# Patient Record
Sex: Female | Born: 1970
Health system: Southern US, Community
[De-identification: ages and names within clinical notes are randomized; demographics above are authoritative.]

## PROBLEM LIST (undated history)

## (undated) DIAGNOSIS — F419 Anxiety disorder, unspecified: Secondary | ICD-10-CM

## (undated) DIAGNOSIS — F329 Major depressive disorder, single episode, unspecified: Secondary | ICD-10-CM

## (undated) DIAGNOSIS — T7840XA Allergy, unspecified, initial encounter: Secondary | ICD-10-CM

## (undated) DIAGNOSIS — E785 Hyperlipidemia, unspecified: Secondary | ICD-10-CM

## (undated) DIAGNOSIS — Z87898 Personal history of other specified conditions: Secondary | ICD-10-CM

## (undated) DIAGNOSIS — E119 Type 2 diabetes mellitus without complications: Secondary | ICD-10-CM

## (undated) DIAGNOSIS — F988 Other specified behavioral and emotional disorders with onset usually occurring in childhood and adolescence: Secondary | ICD-10-CM

## (undated) DIAGNOSIS — G4730 Sleep apnea, unspecified: Secondary | ICD-10-CM

## (undated) DIAGNOSIS — I10 Essential (primary) hypertension: Secondary | ICD-10-CM

## (undated) DIAGNOSIS — J45909 Unspecified asthma, uncomplicated: Secondary | ICD-10-CM

## (undated) DIAGNOSIS — F32A Depression, unspecified: Secondary | ICD-10-CM

## (undated) HISTORY — DX: Anxiety disorder, unspecified: F41.9

## (undated) HISTORY — DX: Other specified behavioral and emotional disorders with onset usually occurring in childhood and adolescence: F98.8

## (undated) HISTORY — DX: Depression, unspecified: F32.A

## (undated) HISTORY — DX: Major depressive disorder, single episode, unspecified: F32.9

## (undated) HISTORY — DX: Unspecified asthma, uncomplicated: J45.909

## (undated) HISTORY — DX: Sleep apnea, unspecified: G47.30

## (undated) HISTORY — DX: Essential (primary) hypertension: I10

## (undated) HISTORY — DX: Personal history of other specified conditions: Z87.898

## (undated) HISTORY — PX: OTHER SURGICAL HISTORY: SHX169

## (undated) HISTORY — DX: Type 2 diabetes mellitus without complications: E11.9

## (undated) HISTORY — DX: Allergy, unspecified, initial encounter: T78.40XA

## (undated) HISTORY — DX: Hyperlipidemia, unspecified: E78.5

---

## 2002-02-11 ENCOUNTER — Other Ambulatory Visit (HOSPITAL_COMMUNITY): Admission: RE | Admit: 2002-02-11 | Discharge: 2002-02-20 | Payer: Self-pay | Admitting: Psychiatry

## 2002-02-27 ENCOUNTER — Encounter: Admission: RE | Admit: 2002-02-27 | Discharge: 2002-02-27 | Payer: Self-pay | Admitting: Psychiatry

## 2002-03-04 ENCOUNTER — Encounter: Admission: RE | Admit: 2002-03-04 | Discharge: 2002-03-04 | Payer: Self-pay | Admitting: Psychiatry

## 2002-03-18 ENCOUNTER — Encounter: Admission: RE | Admit: 2002-03-18 | Discharge: 2002-03-18 | Payer: Self-pay | Admitting: Psychiatry

## 2002-03-25 ENCOUNTER — Encounter: Admission: RE | Admit: 2002-03-25 | Discharge: 2002-03-25 | Payer: Self-pay | Admitting: Psychiatry

## 2002-05-24 ENCOUNTER — Encounter: Admission: RE | Admit: 2002-05-24 | Discharge: 2002-05-24 | Payer: Self-pay | Admitting: Psychiatry

## 2002-05-29 ENCOUNTER — Encounter: Admission: RE | Admit: 2002-05-29 | Discharge: 2002-05-29 | Payer: Self-pay | Admitting: *Deleted

## 2002-06-07 ENCOUNTER — Encounter: Admission: RE | Admit: 2002-06-07 | Discharge: 2002-06-07 | Payer: Self-pay | Admitting: Psychiatry

## 2002-09-24 ENCOUNTER — Encounter: Admission: RE | Admit: 2002-09-24 | Discharge: 2002-09-24 | Payer: Self-pay | Admitting: Psychiatry

## 2005-07-29 ENCOUNTER — Ambulatory Visit: Payer: Self-pay | Admitting: Family Medicine

## 2008-11-19 ENCOUNTER — Ambulatory Visit: Payer: Self-pay | Admitting: Family Medicine

## 2009-11-05 ENCOUNTER — Ambulatory Visit: Payer: Self-pay | Admitting: Family Medicine

## 2010-11-16 ENCOUNTER — Ambulatory Visit: Payer: Self-pay | Admitting: Family Medicine

## 2012-01-19 ENCOUNTER — Ambulatory Visit: Payer: Self-pay | Admitting: Family Medicine

## 2012-08-15 ENCOUNTER — Telehealth: Payer: Self-pay | Admitting: Family Medicine

## 2012-08-15 NOTE — Telephone Encounter (Signed)
Pt was in w/her sister, Samantha Moses who is your pt and says you wanted [place] to check for a sooner apptmt than 10/15/2012 for a new pt. You do not have any that are sooner.  Do you want [place] to schedule this pt in a 30 min slot prior to 10/15/2012? Thank you.

## 2012-08-15 NOTE — Telephone Encounter (Signed)
R/s to 08/22/2012

## 2012-08-15 NOTE — Telephone Encounter (Signed)
Yes please

## 2012-08-22 ENCOUNTER — Ambulatory Visit (INDEPENDENT_AMBULATORY_CARE_PROVIDER_SITE_OTHER): Payer: BC Managed Care – PPO | Admitting: Family Medicine

## 2012-08-22 ENCOUNTER — Encounter: Payer: Self-pay | Admitting: Family Medicine

## 2012-08-22 VITALS — BP 118/68 | HR 76 | Temp 98.1°F | Ht 63.25 in | Wt 230.5 lb

## 2012-08-22 DIAGNOSIS — E1169 Type 2 diabetes mellitus with other specified complication: Secondary | ICD-10-CM | POA: Insufficient documentation

## 2012-08-22 DIAGNOSIS — E785 Hyperlipidemia, unspecified: Secondary | ICD-10-CM

## 2012-08-22 DIAGNOSIS — F4323 Adjustment disorder with mixed anxiety and depressed mood: Secondary | ICD-10-CM | POA: Insufficient documentation

## 2012-08-22 DIAGNOSIS — F32A Depression, unspecified: Secondary | ICD-10-CM

## 2012-08-22 DIAGNOSIS — J309 Allergic rhinitis, unspecified: Secondary | ICD-10-CM

## 2012-08-22 DIAGNOSIS — E119 Type 2 diabetes mellitus without complications: Secondary | ICD-10-CM

## 2012-08-22 DIAGNOSIS — E118 Type 2 diabetes mellitus with unspecified complications: Secondary | ICD-10-CM | POA: Insufficient documentation

## 2012-08-22 DIAGNOSIS — I10 Essential (primary) hypertension: Secondary | ICD-10-CM | POA: Insufficient documentation

## 2012-08-22 DIAGNOSIS — F329 Major depressive disorder, single episode, unspecified: Secondary | ICD-10-CM

## 2012-08-22 DIAGNOSIS — N920 Excessive and frequent menstruation with regular cycle: Secondary | ICD-10-CM

## 2012-08-22 DIAGNOSIS — F988 Other specified behavioral and emotional disorders with onset usually occurring in childhood and adolescence: Secondary | ICD-10-CM | POA: Insufficient documentation

## 2012-08-22 LAB — COMPREHENSIVE METABOLIC PANEL
ALT: 34 U/L (ref 0–35)
AST: 19 U/L (ref 0–37)
Albumin: 4 g/dL (ref 3.5–5.2)
CO2: 25 mEq/L (ref 19–32)
Calcium: 9.2 mg/dL (ref 8.4–10.5)
Chloride: 101 mEq/L (ref 96–112)
Potassium: 3.9 mEq/L (ref 3.5–5.1)
Total Bilirubin: 0.5 mg/dL (ref 0.3–1.2)
Total Protein: 6.9 g/dL (ref 6.0–8.3)

## 2012-08-22 LAB — LIPID PANEL
Cholesterol: 203 mg/dL — ABNORMAL HIGH (ref 0–200)
Total CHOL/HDL Ratio: 5
Triglycerides: 240 mg/dL — ABNORMAL HIGH (ref 0.0–149.0)
VLDL: 48 mg/dL — ABNORMAL HIGH (ref 0.0–40.0)

## 2012-08-22 MED ORDER — NORETHINDRONE ACET-ETHINYL EST 1.5-30 MG-MCG PO TABS: 1.0000 | ORAL_TABLET | Freq: Every day | ORAL | Status: DC

## 2012-08-22 MED ORDER — FLUTICASONE PROPIONATE 50 MCG/ACT NA SUSP: 2.0000 | Freq: Every day | NASAL | Status: DC

## 2012-08-22 MED ORDER — LISINOPRIL-HYDROCHLOROTHIAZIDE 10-12.5 MG PO TABS: 1.0000 | ORAL_TABLET | Freq: Every day | ORAL | Status: DC

## 2012-08-22 NOTE — Progress Notes (Signed)
Subjective:    Patient ID: Samantha Moses, female    DOB: 03-30-1971, 42 y.o.   MRN: [id number]  HPI  Very pleasant 42 yo here to establish care.  DM- diagnosed around the year 2000.  On Glipizide 5 mg daily and Metformin 1000 mg twice daily. Checks her CBGs daily- fasting running between 109-120s.  Highest recently was 150 but this was rare. Denies any episodes of hypoglycemia.  HLD- on Pravastatin 40 mg daily and has been for at least 10 years.  Denies any myalgias.  HTN-  On Lisinopril 20/HCTZ 25 mg daily.  She has had some episodes of hypotension when standing.  [place] and hit her head 5 years ago and she thinks it was due to hypotension.  Depression- sees Dr. Maryruth Bun.  Has been stable on Wellbutrin.  ADD- also followed by Dr. Maryruth Bun.  On Adderall.  Allergic rhinitis- takes fexofenadine daily.  Has cats and she is allergic to them. Symptoms have been worse recently.  Menorrhagia- has always had heavy periods.  She does have boyfriend, uses condoms. G0- UTD pap smear with no family h/o blood clots or cancer other than an aunt with breast CA (paternal).   Patient Active Problem List   Diagnosis Date Noted  . Allergic rhinitis 08/22/2012  . Hyperlipidemia   . Diabetes mellitus without complication   . Hypertension   . Depression   . ADD (attention deficit disorder)    Past Medical History  Diagnosis Date  . Hyperlipidemia   . Diabetes mellitus without complication   . Hypertension   . Depression   . ADD (attention deficit disorder)   . History of seizures    No past surgical history on file. History  Substance Use Topics  . Smoking status: Never Smoker   . Smokeless tobacco: Never Used  . Alcohol Use: Yes     Comment: rarely   Family History  Problem Relation Age of Onset  . Hyperlipidemia Mother   . Hypertension Mother   . Hyperlipidemia Father   . Hypertension Father   . Diabetes Father   . Cancer Maternal Aunt 40    breast CA   Allergies  Allergen  Reactions  . Sulfa Antibiotics Anaphylaxis  . Cefzil (Cefprozil) Rash   No current outpatient prescriptions on file prior to visit.   No current facility-administered medications on file prior to visit.   The PMH, PSH, Social History, Family History, Medications, and allergies have been reviewed in [facility], and have been updated if relevant.   Review of Systems See HPI No fevers No changes in appetite No CP No SOB No wheezing    Objective:   Physical Exam BP 118/68  Pulse 76  Temp(Src) 98.1 F (36.7 C) (Oral)  Ht 5' 3.25" (1.607 m)  Wt 230 lb 8 oz (104.554 kg)  BMI 40.49 kg/m2  LMP 08/01/2012  General:  Obese, Well-developed,well-nourished,in no acute distress; alert,appropriate and cooperative throughout examination Head:  normocephalic and atraumatic.   Eyes:  vision grossly intact, pupils equal, pupils round, and pupils reactive to light.   Ears:  R ear normal and L ear normal.   Nose:  no external deformity.   +boggy turbinates Mouth:  good dentition.   Neck:  No deformities, masses, or tenderness noted. Lungs:  Normal respiratory effort, chest expands symmetrically. Lungs are clear to auscultation, no crackles or wheezes. Heart:  Normal rate and regular rhythm. S1 and S2 normal without gallop, murmur, click, rub or other extra sounds. Abdomen:  Bowel  sounds positive,abdomen soft and non-tender without masses, organomegaly or hernias noted. Msk:  No deformity or scoliosis noted of thoracic or lumbar spine.   Extremities:  No clubbing, cyanosis, edema, or deformity noted with normal full range of motion of all joints.   Neurologic:  alert & oriented X3 and gait normal.   Skin:  Intact without suspicious lesions or rashes Cervical Nodes:  No lymphadenopathy noted Axillary Nodes:  No palpable lymphadenopathy Psych:  Cognition and judgment appear intact. Alert and cooperative with normal attention span and concentration. No apparent delusions, illusions, hallucinations      Assessment & Plan:  1. ADD (attention deficit disorder) Followed by Dr. Maryruth Bun. On adderral.  2. Depression Stable on Wellbutrin.  3. Diabetes mellitus without complication Continue current meds.  Check a1c today. - Hemoglobin A1c  4. Hyperlipidemia Continue pravastatin.  Check lipids today. - Lipid Panel  5. Hypertension With hypotension.  Will decrease dose of lisinopril/HCTZ.  She will have pharmacist at work check BP and call me in two weeks. - Comprehensive metabolic panel  6. Allergic rhinitis Will add flonase.  7. Menorrhagia   Discussed tx options. Non smoker.  Discussed risks and benefits.  Will start Loestrin.

## 2012-08-22 NOTE — Patient Instructions (Addendum)
Good to see you. We will call you with your lab results.  We are decreasing your blood pressure medication. Please call me in 2 weeks with your BP readings.   We are also adding flonase.

## 2012-08-23 ENCOUNTER — Encounter: Payer: Self-pay | Admitting: *Deleted

## 2012-08-24 ENCOUNTER — Encounter: Payer: Self-pay | Admitting: Family Medicine

## 2012-09-11 ENCOUNTER — Encounter: Payer: Self-pay | Admitting: Family Medicine

## 2012-09-11 ENCOUNTER — Other Ambulatory Visit: Payer: Self-pay | Admitting: Family Medicine

## 2012-09-11 MED ORDER — LISINOPRIL 10 MG PO TABS: 10.0000 mg | ORAL_TABLET | Freq: Every day | ORAL | Status: DC

## 2012-10-10 ENCOUNTER — Encounter: Payer: Self-pay | Admitting: Family Medicine

## 2012-10-10 MED ORDER — PRAVASTATIN SODIUM 40 MG PO TABS: 40.0000 mg | ORAL_TABLET | Freq: Every day | ORAL | Status: DC

## 2012-10-10 MED ORDER — GLIPIZIDE 5 MG PO TABS: 5.0000 mg | ORAL_TABLET | Freq: Every day | ORAL | Status: DC

## 2012-10-10 MED ORDER — METFORMIN HCL 1000 MG PO TABS: 1000.0000 mg | ORAL_TABLET | Freq: Two times a day (BID) | ORAL | Status: DC

## 2012-10-10 NOTE — Telephone Encounter (Signed)
Medicines have been sent to pharmacy.

## 2012-10-15 ENCOUNTER — Ambulatory Visit: Payer: Self-pay | Admitting: Family Medicine

## 2012-10-30 ENCOUNTER — Encounter: Payer: Self-pay | Admitting: Family Medicine

## 2012-11-01 ENCOUNTER — Ambulatory Visit (INDEPENDENT_AMBULATORY_CARE_PROVIDER_SITE_OTHER): Payer: BC Managed Care – PPO | Admitting: Family Medicine

## 2012-11-01 ENCOUNTER — Encounter: Payer: Self-pay | Admitting: Family Medicine

## 2012-11-01 VITALS — BP 102/64 | HR 80 | Temp 98.0°F | Wt 232.0 lb

## 2012-11-01 DIAGNOSIS — Z23 Encounter for immunization: Secondary | ICD-10-CM

## 2012-11-01 DIAGNOSIS — S81809A Unspecified open wound, unspecified lower leg, initial encounter: Secondary | ICD-10-CM

## 2012-11-01 DIAGNOSIS — S81812A Laceration without foreign body, left lower leg, initial encounter: Secondary | ICD-10-CM

## 2012-11-01 DIAGNOSIS — S81819A Laceration without foreign body, unspecified lower leg, initial encounter: Secondary | ICD-10-CM | POA: Insufficient documentation

## 2012-11-01 MED ORDER — DOXYCYCLINE HYCLATE 100 MG PO TABS: 100.0000 mg | ORAL_TABLET | Freq: Two times a day (BID) | ORAL | Status: DC

## 2012-11-01 NOTE — Progress Notes (Signed)
Subjective:    Patient ID: Samantha Moses, female    DOB: 05/19/1970, 41 y.o.   MRN: [id number]  HPI Very pleasant 42 yo here for right leg laceration.  [place] the beach and her leg got caught in a wooden/metal door on 7/11.  Since then, laceration has become more swollen, inflamed.  Bottom is draining thick pus on and off.  No fevers.  No n/v/d.  Due for Tdap.  Patient Active Problem List   Diagnosis Date Noted  . Leg laceration 11/01/2012  . Allergic rhinitis 08/22/2012  . Menorrhagia 08/22/2012  . Hyperlipidemia   . Diabetes mellitus without complication   . Hypertension   . Depression   . ADD (attention deficit disorder)    Past Medical History  Diagnosis Date  . Hyperlipidemia   . Diabetes mellitus without complication   . Hypertension   . Depression   . ADD (attention deficit disorder)   . History of seizures    No past surgical history on file. History  Substance Use Topics  . Smoking status: Never Smoker   . Smokeless tobacco: Never Used  . Alcohol Use: Yes     Comment: rarely   Family History  Problem Relation Age of Onset  . Hyperlipidemia Mother   . Hypertension Mother   . Hyperlipidemia Father   . Hypertension Father   . Diabetes Father   . Cancer Maternal Aunt 40    breast CA   Allergies  Allergen Reactions  . Sulfa Antibiotics Anaphylaxis  . Cefzil (Cefprozil) Rash   Current Outpatient Prescriptions on File Prior to Visit  Medication Sig Dispense Refill  . albuterol (PROVENTIL HFA;VENTOLIN HFA) 108 (90 BASE) MCG/ACT inhaler Inhale 2 puffs into the lungs every 4 (four) hours as needed for wheezing.      Marland Kitchen amphetamine-dextroamphetamine (ADDERALL XR) 20 MG 24 hr capsule Take 20 mg by mouth every morning.      Marland Kitchen buPROPion (WELLBUTRIN SR) 150 MG 12 hr tablet Take 150 mg by mouth 2 (two) times daily.      . Calcium Carb-Cholecalciferol (CALCIUM-VITAMIN D) 600-400 MG-UNIT TABS Take by mouth.      . Cyanocobalamin (VITAMIN B 12) 100 MCG LOZG Take  by mouth.      . fexofenadine (ALLEGRA) 180 MG tablet Take 180 mg by mouth daily.      . fluticasone (FLONASE) 50 MCG/ACT nasal spray Place 2 sprays into the nose daily.  16 g  6  . glipiZIDE (GLUCOTROL) 5 MG tablet Take 1 tablet (5 mg total) by mouth daily.  30 tablet  5  . lisinopril (PRINIVIL,ZESTRIL) 10 MG tablet Take 1 tablet (10 mg total) by mouth daily.  90 tablet  3  . metFORMIN (GLUCOPHAGE) 1000 MG tablet Take 1 tablet (1,000 mg total) by mouth 2 (two) times daily with a meal.  60 tablet  5  . Multiple Vitamin (MULTIVITAMIN) capsule Take 1 capsule by mouth daily.      . Norethindrone Acetate-Ethinyl Estradiol (JUNEL,LOESTRIN,MICROGESTIN) 1.5-30 MG-MCG tablet Take 1 tablet by mouth daily.  1 Package  11  . Omega-3 Fatty Acids (FISH OIL TRIPLE STRENGTH) 1400 MG CAPS Take by mouth 2 (two) times daily.      . pravastatin (PRAVACHOL) 40 MG tablet Take 1 tablet (40 mg total) by mouth daily.  30 tablet  5   No current facility-administered medications on file prior to visit.   The PMH, PSH, Social History, Family History, Medications, and allergies have been reviewed in [facility],  and have been updated if relevant.    Review of Systems    See HPI Objective:   Physical Exam  Constitutional: She appears well-developed and well-nourished.  HENT:  Head: Normocephalic and atraumatic.  Skin: Skin is warm and dry. Abrasion and bruising noted.      BP 102/64  Pulse 80  Temp(Src) 98 F (36.7 C)  Wt 232 lb (105.235 kg)  BMI 40.75 kg/m2        Assessment & Plan:  1. Leg laceration, left, initial encounter New- now appears infected. Will treat with 10 day course of doxycyline 100 mg twice daily x 10 days. Tdap given. Call or return to clinic prn if these symptoms worsen or fail to improve as anticipated. The patient indicates understanding of these issues and agrees with the plan.

## 2012-11-01 NOTE — Patient Instructions (Addendum)
Great to see you. Please take doxycycline as directed- 1 tablet twice daily x 10 days.  Call me if no improvement or worsening symptoms.

## 2012-11-01 NOTE — Addendum Note (Signed)
Addended by: Eliezer Bottom on: 11/01/2012 10:17 AM   Modules accepted: Orders

## 2012-11-05 ENCOUNTER — Encounter: Payer: Self-pay | Admitting: Family Medicine

## 2012-11-06 ENCOUNTER — Encounter: Payer: Self-pay | Admitting: Family Medicine

## 2012-11-06 ENCOUNTER — Ambulatory Visit (INDEPENDENT_AMBULATORY_CARE_PROVIDER_SITE_OTHER): Payer: BC Managed Care – PPO | Admitting: Family Medicine

## 2012-11-06 VITALS — BP 110/70 | HR 72 | Temp 98.1°F | Ht 63.25 in | Wt 234.0 lb

## 2012-11-06 DIAGNOSIS — R21 Rash and other nonspecific skin eruption: Secondary | ICD-10-CM | POA: Insufficient documentation

## 2012-11-06 DIAGNOSIS — S81812D Laceration without foreign body, left lower leg, subsequent encounter: Secondary | ICD-10-CM

## 2012-11-06 DIAGNOSIS — Z5189 Encounter for other specified aftercare: Secondary | ICD-10-CM

## 2012-11-06 MED ORDER — AMOXICILLIN-POT CLAVULANATE 875-125 MG PO TABS: 1.0000 | ORAL_TABLET | Freq: Two times a day (BID) | ORAL | Status: DC

## 2012-11-06 MED ORDER — DEXAMETHASONE SODIUM PHOSPHATE 10 MG/ML IJ SOLN
10.0000 mg | Freq: Once | INTRAMUSCULAR | Status: AC
  Administered 2012-11-06: 10 mg via INTRAMUSCULAR

## 2012-11-06 NOTE — Patient Instructions (Addendum)
Good to see you. Please take Augmentin as directed- 1 tablet twice daily for 5 days.  Please call me at end of the week with an update of your rash (or send me an email), sooner if it has worsened.

## 2012-11-06 NOTE — Addendum Note (Signed)
Addended by: Sydell Axon C on: 11/06/2012 09:36 AM   Modules accepted: Orders

## 2012-11-06 NOTE — Progress Notes (Signed)
Subjective:    Patient ID: Samantha Moses, female    DOB: 24-Nov-1970, 42 y.o.   MRN: [id number]  HPI Very pleasant 42 yo here for rash and follow up right leg laceration.  Was at the beach and her leg got caught in a wooden/metal door on 7/11.  I saw her for this complaint on 7/17/    Given doxycyline for infected laceration.  A couple of days later, developed itchy rash on cheeks bilaterally that has since worsened.  Stopped taking the doxycyline yesterday and rash has not worsened.  Has multiple other abx allergies.  Used new hair dye as well.  No SOB or wheezing.  Patient Active Problem List   Diagnosis Date Noted  . Rash and nonspecific skin eruption 11/06/2012  . Leg laceration 11/01/2012  . Allergic rhinitis 08/22/2012  . Menorrhagia 08/22/2012  . Hyperlipidemia   . Diabetes mellitus without complication   . Hypertension   . Depression   . ADD (attention deficit disorder)    Past Medical History  Diagnosis Date  . Hyperlipidemia   . Diabetes mellitus without complication   . Hypertension   . Depression   . ADD (attention deficit disorder)   . History of seizures    No past surgical history on file. History  Substance Use Topics  . Smoking status: Never Smoker   . Smokeless tobacco: Never Used  . Alcohol Use: Yes     Comment: rarely   Family History  Problem Relation Age of Onset  . Hyperlipidemia Mother   . Hypertension Mother   . Hyperlipidemia Father   . Hypertension Father   . Diabetes Father   . Cancer Maternal Aunt 40    breast CA   Allergies  Allergen Reactions  . Sulfa Antibiotics Anaphylaxis  . Doxycycline     rash  . Cefzil (Cefprozil) Rash   Current Outpatient Prescriptions on File Prior to Visit  Medication Sig Dispense Refill  . albuterol (PROVENTIL HFA;VENTOLIN HFA) 108 (90 BASE) MCG/ACT inhaler Inhale 2 puffs into the lungs every 4 (four) hours as needed for wheezing.      Marland Kitchen amphetamine-dextroamphetamine (ADDERALL XR) 20 MG 24 hr  capsule Take 20 mg by mouth every morning.      Marland Kitchen buPROPion (WELLBUTRIN SR) 150 MG 12 hr tablet Take 150 mg by mouth 2 (two) times daily.      . Calcium Carb-Cholecalciferol (CALCIUM-VITAMIN D) 600-400 MG-UNIT TABS Take by mouth.      . Cyanocobalamin (VITAMIN B 12) 100 MCG LOZG Take by mouth.      . fexofenadine (ALLEGRA) 180 MG tablet Take 180 mg by mouth daily.      . fluticasone (FLONASE) 50 MCG/ACT nasal spray Place 2 sprays into the nose daily.  16 g  6  . glipiZIDE (GLUCOTROL) 5 MG tablet Take 1 tablet (5 mg total) by mouth daily.  30 tablet  5  . lisinopril (PRINIVIL,ZESTRIL) 10 MG tablet Take 1 tablet (10 mg total) by mouth daily.  90 tablet  3  . metFORMIN (GLUCOPHAGE) 1000 MG tablet Take 1 tablet (1,000 mg total) by mouth 2 (two) times daily with a meal.  60 tablet  5  . Mometasone Furo-Formoterol Fum (DULERA IN) 2 puffs twice a day      . Multiple Vitamin (MULTIVITAMIN) capsule Take 1 capsule by mouth daily.      . Norethindrone Acetate-Ethinyl Estradiol (JUNEL,LOESTRIN,MICROGESTIN) 1.5-30 MG-MCG tablet Take 1 tablet by mouth daily.  1 Package  11  .  Omega-3 Fatty Acids (FISH OIL TRIPLE STRENGTH) 1400 MG CAPS Take by mouth 2 (two) times daily.      . pravastatin (PRAVACHOL) 40 MG tablet Take 1 tablet (40 mg total) by mouth daily.  30 tablet  5  . doxycycline (VIBRA-TABS) 100 MG tablet Take 1 tablet (100 mg total) by mouth 2 (two) times daily.  20 tablet  0   No current facility-administered medications on file prior to visit.   The PMH, PSH, Social History, Family History, Medications, and allergies have been reviewed in [facility], and have been updated if relevant.    Review of Systems    See HPI Objective:   Physical Exam  Constitutional: She appears well-developed and well-nourished.  HENT:  Head: Normocephalic and atraumatic.  Skin: Skin is warm and dry. Abrasion and bruising noted, improved.  Bilateral raised, warm, macular rash on cheeks, extends to ears bilaterally.      BP 110/70  Pulse 72  Temp(Src) 98.1 F (36.7 C) (Oral)  Ht 5' 3.25" (1.607 m)  Wt 234 lb (106.142 kg)  BMI 41.1 kg/m2  LMP 11/06/2012        Assessment & Plan:  1. Leg laceration, left, subsequent encounter Improving.  Will treat with 5 more days of abx ( augmentin twice daily).  2. Rash and nonspecific skin eruption I suspect doxycyline allergy.  Doxycyline d/c'd and added to allergy list. Decadron IM given to help with inflammatory response. Call or return to clinic prn if these symptoms worsen or fail to improve as anticipated. The patient indicates understanding of these issues and agrees with the plan.

## 2013-02-07 ENCOUNTER — Other Ambulatory Visit: Payer: Self-pay | Admitting: *Deleted

## 2013-02-07 MED ORDER — GLUCOSE BLOOD VI STRP: ORAL_STRIP | Status: DC

## 2013-02-21 ENCOUNTER — Other Ambulatory Visit: Payer: Self-pay

## 2013-05-03 ENCOUNTER — Other Ambulatory Visit: Payer: Self-pay | Admitting: Family Medicine

## 2013-05-03 NOTE — Telephone Encounter (Signed)
Pt requesting medication refill. Last ov was 11/06/12 with no future appts scheduled. pls advise

## 2013-06-10 ENCOUNTER — Encounter: Payer: Self-pay | Admitting: Family Medicine

## 2013-06-20 ENCOUNTER — Other Ambulatory Visit: Payer: Self-pay | Admitting: Family Medicine

## 2013-07-03 ENCOUNTER — Other Ambulatory Visit: Payer: Self-pay | Admitting: Family Medicine

## 2013-07-03 NOTE — Telephone Encounter (Signed)
Ok to refill one month only.  Needs OV for further refills.

## 2013-07-03 NOTE — Telephone Encounter (Signed)
Last OV 10/2012.  Ok to refill?

## 2013-07-27 ENCOUNTER — Other Ambulatory Visit: Payer: Self-pay | Admitting: Family Medicine

## 2013-08-05 LAB — HM DIABETES EYE EXAM

## 2013-08-15 ENCOUNTER — Other Ambulatory Visit: Payer: Self-pay | Admitting: Family Medicine

## 2013-08-23 ENCOUNTER — Encounter: Payer: Self-pay | Admitting: Family Medicine

## 2013-08-26 ENCOUNTER — Other Ambulatory Visit: Payer: Self-pay | Admitting: Family Medicine

## 2013-08-26 ENCOUNTER — Telehealth: Payer: Self-pay | Admitting: Family Medicine

## 2013-08-26 DIAGNOSIS — E785 Hyperlipidemia, unspecified: Secondary | ICD-10-CM

## 2013-08-26 DIAGNOSIS — Z0000 Encounter for general adult medical examination without abnormal findings: Secondary | ICD-10-CM

## 2013-08-26 DIAGNOSIS — I10 Essential (primary) hypertension: Secondary | ICD-10-CM

## 2013-08-26 DIAGNOSIS — E119 Type 2 diabetes mellitus without complications: Secondary | ICD-10-CM

## 2013-08-26 NOTE — Telephone Encounter (Signed)
Relevant patient education mailed to patient.  

## 2013-08-28 ENCOUNTER — Other Ambulatory Visit (INDEPENDENT_AMBULATORY_CARE_PROVIDER_SITE_OTHER): Payer: BC Managed Care – PPO

## 2013-08-28 DIAGNOSIS — E119 Type 2 diabetes mellitus without complications: Secondary | ICD-10-CM

## 2013-08-28 DIAGNOSIS — I10 Essential (primary) hypertension: Secondary | ICD-10-CM

## 2013-08-28 DIAGNOSIS — Z0000 Encounter for general adult medical examination without abnormal findings: Secondary | ICD-10-CM

## 2013-08-28 DIAGNOSIS — E785 Hyperlipidemia, unspecified: Secondary | ICD-10-CM

## 2013-08-28 LAB — CBC WITH DIFFERENTIAL/PLATELET
BASOS ABS: 0 10*3/uL (ref 0.0–0.1)
Basophils Relative: 0.5 % (ref 0.0–3.0)
Eosinophils Absolute: 1.2 10*3/uL — ABNORMAL HIGH (ref 0.0–0.7)
Eosinophils Relative: 13.1 % — ABNORMAL HIGH (ref 0.0–5.0)
HEMATOCRIT: 42.7 % (ref 36.0–46.0)
Hemoglobin: 14.6 g/dL (ref 12.0–15.0)
LYMPHS ABS: 2.9 10*3/uL (ref 0.7–4.0)
LYMPHS PCT: 32.2 % (ref 12.0–46.0)
MCHC: 34.2 g/dL (ref 30.0–36.0)
MCV: 90.7 fl (ref 78.0–100.0)
Monocytes Absolute: 0.4 10*3/uL (ref 0.1–1.0)
Monocytes Relative: 4.8 % (ref 3.0–12.0)
NEUTROS PCT: 49.4 % (ref 43.0–77.0)
Neutro Abs: 4.5 10*3/uL (ref 1.4–7.7)
Platelets: 255 10*3/uL (ref 150.0–400.0)
RBC: 4.7 Mil/uL (ref 3.87–5.11)
RDW: 13.1 % (ref 11.5–15.5)
WBC: 9.1 10*3/uL (ref 4.0–10.5)

## 2013-08-28 LAB — COMPREHENSIVE METABOLIC PANEL
ALBUMIN: 4.1 g/dL (ref 3.5–5.2)
ALT: 60 U/L — ABNORMAL HIGH (ref 0–35)
AST: 36 U/L (ref 0–37)
Alkaline Phosphatase: 40 U/L (ref 39–117)
BILIRUBIN TOTAL: 0.6 mg/dL (ref 0.2–1.2)
BUN: 17 mg/dL (ref 6–23)
CO2: 23 mEq/L (ref 19–32)
Calcium: 9.1 mg/dL (ref 8.4–10.5)
Chloride: 102 mEq/L (ref 96–112)
Creatinine, Ser: 0.9 mg/dL (ref 0.4–1.2)
GFR: 73.28 mL/min (ref >60.00)
Glucose, Bld: 119 mg/dL — ABNORMAL HIGH (ref 70–99)
Potassium: 4.2 mEq/L (ref 3.5–5.1)
Sodium: 134 mEq/L — ABNORMAL LOW (ref 135–145)
Total Protein: 6.7 g/dL (ref 6.0–8.3)

## 2013-08-28 LAB — HEMOGLOBIN A1C: HEMOGLOBIN A1C: 6 % (ref 4.6–6.5)

## 2013-08-28 LAB — LIPID PANEL
Cholesterol: 181 mg/dL (ref 0–200)
HDL: 43.4 mg/dL (ref >39.00)
LDL CALC: 97 mg/dL (ref 0–99)
Total CHOL/HDL Ratio: 4
Triglycerides: 201 mg/dL — ABNORMAL HIGH (ref 0.0–149.0)
VLDL: 40.2 mg/dL — AB (ref 0.0–40.0)

## 2013-08-28 LAB — TSH: TSH: 3.69 u[IU]/mL (ref 0.35–4.50)

## 2013-09-03 ENCOUNTER — Ambulatory Visit (INDEPENDENT_AMBULATORY_CARE_PROVIDER_SITE_OTHER): Payer: BC Managed Care – PPO | Admitting: Family Medicine

## 2013-09-03 ENCOUNTER — Encounter: Payer: Self-pay | Admitting: Family Medicine

## 2013-09-03 VITALS — BP 106/58 | HR 84 | Temp 98.0°F | Ht 63.25 in | Wt 228.5 lb

## 2013-09-03 DIAGNOSIS — F329 Major depressive disorder, single episode, unspecified: Secondary | ICD-10-CM

## 2013-09-03 DIAGNOSIS — R7989 Other specified abnormal findings of blood chemistry: Secondary | ICD-10-CM

## 2013-09-03 DIAGNOSIS — F988 Other specified behavioral and emotional disorders with onset usually occurring in childhood and adolescence: Secondary | ICD-10-CM

## 2013-09-03 DIAGNOSIS — J309 Allergic rhinitis, unspecified: Secondary | ICD-10-CM

## 2013-09-03 DIAGNOSIS — R945 Abnormal results of liver function studies: Secondary | ICD-10-CM

## 2013-09-03 DIAGNOSIS — F32A Depression, unspecified: Secondary | ICD-10-CM

## 2013-09-03 DIAGNOSIS — E119 Type 2 diabetes mellitus without complications: Secondary | ICD-10-CM

## 2013-09-03 DIAGNOSIS — Z0000 Encounter for general adult medical examination without abnormal findings: Secondary | ICD-10-CM

## 2013-09-03 DIAGNOSIS — E785 Hyperlipidemia, unspecified: Secondary | ICD-10-CM

## 2013-09-03 DIAGNOSIS — F3289 Other specified depressive episodes: Secondary | ICD-10-CM

## 2013-09-03 DIAGNOSIS — I10 Essential (primary) hypertension: Secondary | ICD-10-CM

## 2013-09-03 MED ORDER — MONTELUKAST SODIUM 10 MG PO TABS: 10.0000 mg | ORAL_TABLET | Freq: Every day | ORAL | Status: DC

## 2013-09-03 MED ORDER — PRAVASTATIN SODIUM 40 MG PO TABS: ORAL_TABLET | ORAL | Status: DC

## 2013-09-03 MED ORDER — LISINOPRIL 10 MG PO TABS: 10.0000 mg | ORAL_TABLET | Freq: Every day | ORAL | Status: DC

## 2013-09-03 MED ORDER — METFORMIN HCL 1000 MG PO TABS: ORAL_TABLET | ORAL | Status: DC

## 2013-09-03 MED ORDER — NORETHINDRONE ACET-ETHINYL EST 1.5-30 MG-MCG PO TABS: 1.0000 | ORAL_TABLET | Freq: Every day | ORAL | Status: DC

## 2013-09-03 NOTE — Progress Notes (Signed)
Subjective:    Patient ID: Samantha Moses, female    DOB: 05/06/1970, 43 y.o.   MRN: [id number]  HPI  Very pleasant 43 yo female here for CPX. Last pap smear 02/13/12 Lovie Macadamia).  No h/o abnormal pap smears. Mammogram 01/19/12. Mammogram scheduled in 2 weeks.  On OCPs for menorrhagia. No family h/o blood clots or cancer other than an aunt with breast CA (paternal) and cousin. Non smoker.  DM- diagnosed around the year 2000.  On Metformin 1000 mg twice daily.  Stopped taking Glypizide 2 months ago due to hypoglycemia. Checks her fasting FSBS daily- fasting running between 110-120s.  No further episodes of hypoglycemia.  Lab Results  Component Value Date   HGBA1C 6.0 08/28/2013    HLD- on Pravastatin 40 mg daily and has been for at least 10 years.  Denies any myalgias. Lab Results  Component Value Date   CHOL 181 08/28/2013   HDL 43.40 08/28/2013   LDLCALC 97 08/28/2013   LDLDIRECT 115.6 08/22/2012   TRIG 201.0* 08/28/2013   CHOLHDL 4 08/28/2013     HTN-  On Lisinopril 10 mg daily. Denies any HA, blurred vision, CP or SOB.  Depression- sees Dr. Nicolasa Ducking.  Has been stable on Wellbutrin.  ADD- also followed by Dr. Nicolasa Ducking.  On Adderall.  Obesity- trying to eat better, avoiding fast food. Cooking at home more.  Admits to not eating vegetables. Not exercising.  Allergic rhinitis with recurrent bronchitis.  Using her rescue inhaler daily for past month for wheezing.  Takes allegra daily.  Sometimes takes Benadryl at night if working outside or increased symptoms.  Mildly elevated LFTs.  No yellowing of skin or eyes.  No abdominal pain. Lab Results  Component Value Date   ALT 60* 08/28/2013   AST 36 08/28/2013   ALKPHOS 40 08/28/2013   BILITOT 0.6 08/28/2013    Patient Active Problem List   Diagnosis Date Noted  . Routine general medical examination at a health care facility 09/03/2013  . Morbid obesity 09/03/2013  . Allergic rhinitis 08/22/2012  . Menorrhagia 08/22/2012  .  Hyperlipidemia   . Diabetes mellitus without complication   . Hypertension   . Depression   . ADD (attention deficit disorder)    Past Medical History  Diagnosis Date  . Hyperlipidemia   . Diabetes mellitus without complication   . Hypertension   . Depression   . ADD (attention deficit disorder)   . History of seizures    No past surgical history on file. History  Substance Use Topics  . Smoking status: Never Smoker   . Smokeless tobacco: Never Used  . Alcohol Use: Yes     Comment: rarely   Family History  Problem Relation Age of Onset  . Hyperlipidemia Mother   . Hypertension Mother   . Hyperlipidemia Father   . Hypertension Father   . Diabetes Father   . Cancer Maternal Aunt 40    breast CA   Allergies  Allergen Reactions  . Sulfa Antibiotics Anaphylaxis  . Doxycycline     rash  . Cefzil [Cefprozil] Rash   Current Outpatient Prescriptions on File Prior to Visit  Medication Sig Dispense Refill  . albuterol (PROVENTIL HFA;VENTOLIN HFA) 108 (90 BASE) MCG/ACT inhaler Inhale 2 puffs into the lungs every 4 (four) hours as needed for wheezing.      Marland Kitchen amphetamine-dextroamphetamine (ADDERALL XR) 20 MG 24 hr capsule Take 20 mg by mouth every morning.      Marland Kitchen buPROPion [facility] SR) 150  MG 12 hr tablet Take 150 mg by mouth 2 (two) times daily.      . fexofenadine (ALLEGRA) 180 MG tablet Take 180 mg by mouth daily.      Marland Kitchen glucose blood (TRUETEST TEST) test strip Use as instructed to test blood sugar twice daily.  Diagnosis:  250.00  Non-insulin-dependent.  100 each  2  . lisinopril (PRINIVIL,ZESTRIL) 10 MG tablet Take 1 tablet (10 mg total) by mouth daily.  90 tablet  3  . metFORMIN (GLUCOPHAGE) 1000 MG tablet TAKE 1 TABLET BY MOUTH TWICE DAILY WITH A MEAL  60 tablet  0  . Mometasone Furo-Formoterol Fum (DULERA IN) 2 puffs twice a day      . Omega-3 Fatty Acids (FISH OIL TRIPLE STRENGTH) 1400 MG CAPS Take by mouth 2 (two) times daily.      . pravastatin (PRAVACHOL) 40 MG  tablet TAKE 1 TABLET BY MOUTH EVERY DAY  30 tablet  5   No current facility-administered medications on file prior to visit.   The PMH, PSH, Social History, Family History, Medications, and allergies have been reviewed in [facility], and have been updated if relevant.   Review of Systems See HPI No fevers No changes in appetite No CP + wheezing with intermittent SOB + nasal drainage No changes in bowel habits No abdominal pain    Objective:   Physical Exam BP 106/58  Pulse 84  Temp(Src) 98 F (36.7 C) (Oral)  Ht 5' 3.25" (1.607 m)  Wt 228 lb 8 oz (103.647 kg)  BMI 40.14 kg/m2  SpO2 97%  LMP 09/01/2013  General:  Obese, Well-developed,well-nourished,in no acute distress; alert,appropriate and cooperative throughout examination Head:  normocephalic and atraumatic.   Eyes:  vision grossly intact, pupils equal, pupils round, and pupils reactive to light.   Ears:  R ear normal and L ear normal.   Nose:  no external deformity.   +boggy turbinates Mouth:  good dentition.   Neck:  No deformities, masses, or tenderness noted. Lungs:  Normal respiratory effort, chest expands symmetrically. Lungs are clear to auscultation, no crackles or wheezes. Heart:  Normal rate and regular rhythm. S1 and S2 normal without gallop, murmur, click, rub or other extra sounds. Abdomen:  Bowel sounds positive,abdomen soft and non-tender without masses, organomegaly or hernias noted. Msk:  No deformity or scoliosis noted of thoracic or lumbar spine.   Extremities:  No clubbing, cyanosis, edema, or deformity noted with normal full range of motion of all joints.   Neurologic:  alert & oriented X3 and gait normal.   Skin:  Intact without suspicious lesions or rashes Cervical Nodes:  No lymphadenopathy noted Axillary Nodes:  No palpable lymphadenopathy Psych:  Cognition and judgment appear intact. Alert and cooperative with normal attention span and concentration. No apparent delusions, illusions,  hallucinations Diabetic foot exam: Normal inspection No skin breakdown No calluses  Normal DP pulses Normal sensation to light touch and monofilament Nails normal     Assessment & Plan:

## 2013-09-03 NOTE — Assessment & Plan Note (Signed)
Well controlled. No changes. 

## 2013-09-03 NOTE — Assessment & Plan Note (Signed)
Stable on current rx. Only mildly elevated LFts. Recheck in 3 months.

## 2013-09-03 NOTE — Progress Notes (Signed)
Pre visit review using our clinic review tool, if applicable. No additional management support is needed unless otherwise documented below in the visit note. 

## 2013-09-03 NOTE — Addendum Note (Signed)
Addended by: Lucille Passy on: 09/03/2013 09:03 AM   Modules accepted: Orders

## 2013-09-03 NOTE — Assessment & Plan Note (Signed)
Deteriorated. Exam reassuring. Add singulair. Call or return to clinic prn if these symptoms worsen or fail to improve as anticipated. The patient indicates understanding of these issues and agrees with the plan.

## 2013-09-03 NOTE — Assessment & Plan Note (Signed)
Well controlled. On ACEI On statin. No changes.

## 2013-09-03 NOTE — Assessment & Plan Note (Signed)
Discussed weight loss plan. She would like to work with nutritionist  Referral placed. Advised increased physical activity.

## 2013-09-03 NOTE — Assessment & Plan Note (Signed)
Reviewed preventive care protocols, scheduled due services, and updated immunizations Discussed nutrition, exercise, diet, and healthy lifestyle.  Mammogram scheduled.

## 2013-09-03 NOTE — Patient Instructions (Addendum)
Great to see you. We will call you with a referral to a nutritonist.  We are starting Singulair daily. Please call me in 1-2 weeks with an update.

## 2013-09-12 ENCOUNTER — Ambulatory Visit: Payer: Self-pay | Admitting: Family Medicine

## 2013-09-13 ENCOUNTER — Encounter: Payer: Self-pay | Admitting: Family Medicine

## 2013-10-28 ENCOUNTER — Encounter: Payer: Self-pay | Admitting: Family Medicine

## 2013-10-29 ENCOUNTER — Encounter: Payer: Self-pay | Admitting: Family Medicine

## 2013-10-30 ENCOUNTER — Ambulatory Visit: Payer: Self-pay | Admitting: Family Medicine

## 2013-10-30 NOTE — Telephone Encounter (Signed)
Spoke to pt and informed her that her paperwork has been received and will be faxed to requested # at [phone]

## 2013-11-16 ENCOUNTER — Ambulatory Visit: Payer: Self-pay | Admitting: Family Medicine

## 2013-12-04 ENCOUNTER — Other Ambulatory Visit (INDEPENDENT_AMBULATORY_CARE_PROVIDER_SITE_OTHER): Payer: BC Managed Care – PPO

## 2013-12-04 ENCOUNTER — Encounter: Payer: Self-pay | Admitting: *Deleted

## 2013-12-04 DIAGNOSIS — R7989 Other specified abnormal findings of blood chemistry: Secondary | ICD-10-CM

## 2013-12-04 DIAGNOSIS — R945 Abnormal results of liver function studies: Principal | ICD-10-CM

## 2013-12-04 LAB — HEPATIC FUNCTION PANEL
ALBUMIN: 3.8 g/dL (ref 3.5–5.2)
ALK PHOS: 35 U/L — AB (ref 39–117)
ALT: 33 U/L (ref 0–35)
AST: 20 U/L (ref 0–37)
Bilirubin, Direct: 0 mg/dL (ref 0.0–0.3)
Total Bilirubin: 0.6 mg/dL (ref 0.2–1.2)
Total Protein: 7.1 g/dL (ref 6.0–8.3)

## 2013-12-17 ENCOUNTER — Ambulatory Visit: Payer: Self-pay | Admitting: Family Medicine

## 2013-12-28 ENCOUNTER — Other Ambulatory Visit: Payer: Self-pay | Admitting: Family Medicine

## 2014-01-12 ENCOUNTER — Other Ambulatory Visit: Payer: Self-pay | Admitting: Family Medicine

## 2014-01-31 ENCOUNTER — Other Ambulatory Visit: Payer: Self-pay | Admitting: Family Medicine

## 2014-02-12 ENCOUNTER — Other Ambulatory Visit: Payer: Self-pay | Admitting: Family Medicine

## 2014-02-20 ENCOUNTER — Encounter: Payer: Self-pay | Admitting: Family Medicine

## 2014-02-20 ENCOUNTER — Ambulatory Visit (INDEPENDENT_AMBULATORY_CARE_PROVIDER_SITE_OTHER): Payer: BC Managed Care – PPO | Admitting: Family Medicine

## 2014-02-20 VITALS — BP 122/70 | HR 91 | Temp 98.0°F | Wt 227.5 lb

## 2014-02-20 DIAGNOSIS — R21 Rash and other nonspecific skin eruption: Secondary | ICD-10-CM

## 2014-02-20 DIAGNOSIS — E119 Type 2 diabetes mellitus without complications: Secondary | ICD-10-CM

## 2014-02-20 DIAGNOSIS — E785 Hyperlipidemia, unspecified: Secondary | ICD-10-CM

## 2014-02-20 DIAGNOSIS — J069 Acute upper respiratory infection, unspecified: Secondary | ICD-10-CM

## 2014-02-20 LAB — COMPREHENSIVE METABOLIC PANEL
ALK PHOS: 42 U/L (ref 39–117)
ALT: 76 U/L — AB (ref 0–35)
AST: 39 U/L — ABNORMAL HIGH (ref 0–37)
Albumin: 3.3 g/dL — ABNORMAL LOW (ref 3.5–5.2)
BILIRUBIN TOTAL: 0.5 mg/dL (ref 0.2–1.2)
BUN: 13 mg/dL (ref 6–23)
CALCIUM: 9.1 mg/dL (ref 8.4–10.5)
CO2: 23 mEq/L (ref 19–32)
CREATININE: 0.8 mg/dL (ref 0.4–1.2)
Chloride: 102 mEq/L (ref 96–112)
GFR: 82.69 mL/min (ref >60.00)
Glucose, Bld: 133 mg/dL — ABNORMAL HIGH (ref 70–99)
Potassium: 4.5 mEq/L (ref 3.5–5.1)
Sodium: 138 mEq/L (ref 135–145)
Total Protein: 6.5 g/dL (ref 6.0–8.3)

## 2014-02-20 LAB — HEMOGLOBIN A1C: Hgb A1c MFr Bld: 6.1 % (ref 4.6–6.5)

## 2014-02-20 LAB — SEDIMENTATION RATE: Sed Rate: 15 mm/hr (ref 0–22)

## 2014-02-20 MED ORDER — HYDROCODONE-HOMATROPINE 5-1.5 MG/5ML PO SYRP: 5.0000 mL | ORAL_SOLUTION | Freq: Three times a day (TID) | ORAL | Status: DC | PRN

## 2014-02-20 MED ORDER — BENZONATATE 100 MG PO CAPS: 100.0000 mg | ORAL_CAPSULE | Freq: Two times a day (BID) | ORAL | Status: DC | PRN

## 2014-02-20 NOTE — Addendum Note (Signed)
Addended by: Daralene Milch C on: 02/20/2014 11:17 AM   Modules accepted: Orders

## 2014-02-20 NOTE — Patient Instructions (Signed)
Great to see you. Drink plenty of fluids. Cough suppressant as needed. Keep me updated.

## 2014-02-20 NOTE — Progress Notes (Signed)
Pre visit review using our clinic review tool, if applicable. No additional management support is needed unless otherwise documented below in the visit note. 

## 2014-02-20 NOTE — Progress Notes (Signed)
SUBJECTIVE:  Samantha Moses is a 43 y.o. female who complains of coryza, congestion and dry cough for 4 days. She denies a history of anorexia, chest pain, dizziness, fevers, myalgias, shortness of breath, sweats, wheezing and sputum production and admits to a history of asthma. Patient denies smoke cigarettes.   Current Outpatient Prescriptions on File Prior to Visit  Medication Sig Dispense Refill  . albuterol (PROVENTIL HFA;VENTOLIN HFA) 108 (90 BASE) MCG/ACT inhaler Inhale 2 puffs into the lungs every 4 (four) hours as needed for wheezing.    Marland Kitchen amphetamine-dextroamphetamine (ADDERALL XR) 20 MG 24 hr capsule Take 20 mg by mouth every morning.    Marland Kitchen buPROPion (WELLBUTRIN SR) 150 MG 12 hr tablet Take 150 mg by mouth 2 (two) times daily.    . fexofenadine (ALLEGRA) 180 MG tablet Take 180 mg by mouth daily.    Marland Kitchen lisinopril (PRINIVIL,ZESTRIL) 10 MG tablet Take 1 tablet (10 mg total) by mouth daily. 90 tablet 3  . metFORMIN (GLUCOPHAGE) 1000 MG tablet TAKE 1 TABLET BY MOUTH TWICE DAILY WITH A MEAL 60 tablet 0  . Mometasone Furo-Formoterol Fum (DULERA IN) 2 puffs twice a day    . montelukast (SINGULAIR) 10 MG tablet TAKE 1 TABLET BY MOUTH EVERY NIGHT AT BEDTIME 30 tablet 3  . Norethindrone Acetate-Ethinyl Estradiol (JUNEL,LOESTRIN,MICROGESTIN) 1.5-30 MG-MCG tablet Take 1 tablet by mouth daily. 1 Package 11  . Omega-3 Fatty Acids (FISH OIL TRIPLE STRENGTH) 1400 MG CAPS Take by mouth 2 (two) times daily.    . pravastatin (PRAVACHOL) 40 MG tablet TAKE 1 TABLET BY MOUTH EVERY DAY 30 tablet 5  . TRUETEST TEST test strip USE AS DIRECTED TO TEST BLOOD SUGAR TWICE DAILY 100 each 0   No current facility-administered medications on file prior to visit.    Allergies  Allergen Reactions  . Sulfa Antibiotics Anaphylaxis  . Doxycycline     rash  . Cefzil [Cefprozil] Rash    Past Medical History  Diagnosis Date  . Hyperlipidemia   . Diabetes mellitus without complication   . Hypertension   . Depression    . ADD (attention deficit disorder)   . History of seizures     No past surgical history on file.  Family History  Problem Relation Age of Onset  . Hyperlipidemia Mother   . Hypertension Mother   . Hyperlipidemia Father   . Hypertension Father   . Diabetes Father   . Cancer Maternal Aunt 40    breast CA    History   Social History  . Marital Status: Single    Spouse Name: N/A    Number of Children: N/A  . Years of Education: N/A   Occupational History  . Not on file.   Social History Main Topics  . Smoking status: Never Smoker   . Smokeless tobacco: Never Used  . Alcohol Use: Yes     Comment: rarely  . Drug Use: No  . Sexual Activity: Not on file   Other Topics Concern  . Not on file   Social History Narrative   The PMH, PSH, Social History, Family History, Medications, and allergies have been reviewed in [facility], and have been updated if relevant.  OBJECTIVE: BP 122/70 mmHg  Pulse 91  Temp(Src) 98 F (36.7 C) (Oral)  Wt 227 lb 8 oz (103.193 kg)  SpO2 97%  LMP 01/24/2014 (Within Days)  She appears well, vital signs are as noted. Ears normal.  Throat and pharynx normal.  Neck supple. No adenopathy in  the neck. Nose is congested. Sinuses non tender. The chest is clear, without wheezes or rales.  ASSESSMENT:  viral upper respiratory illness  PLAN: Symptomatic therapy suggested: push fluids, rest and return office visit prn if symptoms persist or worsen. Lack of antibiotic effectiveness discussed with her. Call or return to clinic prn if these symptoms worsen or fail to improve as anticipated.

## 2014-02-21 ENCOUNTER — Other Ambulatory Visit (INDEPENDENT_AMBULATORY_CARE_PROVIDER_SITE_OTHER): Payer: BC Managed Care – PPO

## 2014-02-21 DIAGNOSIS — E119 Type 2 diabetes mellitus without complications: Secondary | ICD-10-CM

## 2014-02-21 DIAGNOSIS — E785 Hyperlipidemia, unspecified: Secondary | ICD-10-CM

## 2014-02-21 DIAGNOSIS — J069 Acute upper respiratory infection, unspecified: Secondary | ICD-10-CM

## 2014-02-21 DIAGNOSIS — R21 Rash and other nonspecific skin eruption: Secondary | ICD-10-CM

## 2014-02-21 LAB — ANA: ANA: NEGATIVE

## 2014-02-24 LAB — LUPUS ANTICOAGULANT PANEL
DRVVT: 27.3 s (ref <42.9)
Lupus Anticoagulant: NOT DETECTED
PTT LA: 39.3 s (ref 28.0–43.0)

## 2014-02-26 ENCOUNTER — Encounter: Payer: Self-pay | Admitting: Family Medicine

## 2014-02-28 ENCOUNTER — Other Ambulatory Visit: Payer: Self-pay | Admitting: Family Medicine

## 2014-03-17 ENCOUNTER — Other Ambulatory Visit: Payer: Self-pay | Admitting: Family Medicine

## 2014-04-06 ENCOUNTER — Other Ambulatory Visit: Payer: Self-pay | Admitting: Family Medicine

## 2014-04-08 ENCOUNTER — Encounter: Payer: Self-pay | Admitting: Family Medicine

## 2014-04-08 NOTE — Telephone Encounter (Signed)
I found out, they dont need a Rx for pneumovax. If they are wanting to receive it at the pharmacy, they will administer their own supply of med, as they do the flu shots

## 2014-05-19 ENCOUNTER — Other Ambulatory Visit: Payer: Self-pay | Admitting: Family Medicine

## 2014-06-17 ENCOUNTER — Other Ambulatory Visit: Payer: Self-pay | Admitting: Family Medicine

## 2014-06-23 ENCOUNTER — Other Ambulatory Visit: Payer: Self-pay | Admitting: Family Medicine

## 2014-07-22 ENCOUNTER — Other Ambulatory Visit: Payer: Self-pay | Admitting: Family Medicine

## 2014-09-03 ENCOUNTER — Other Ambulatory Visit: Payer: Self-pay | Admitting: Family Medicine

## 2014-09-03 DIAGNOSIS — Z0000 Encounter for general adult medical examination without abnormal findings: Secondary | ICD-10-CM

## 2014-09-03 DIAGNOSIS — E119 Type 2 diabetes mellitus without complications: Secondary | ICD-10-CM

## 2014-09-04 ENCOUNTER — Other Ambulatory Visit (INDEPENDENT_AMBULATORY_CARE_PROVIDER_SITE_OTHER): Payer: BLUE CROSS/BLUE SHIELD

## 2014-09-04 DIAGNOSIS — E119 Type 2 diabetes mellitus without complications: Secondary | ICD-10-CM

## 2014-09-04 DIAGNOSIS — Z0000 Encounter for general adult medical examination without abnormal findings: Secondary | ICD-10-CM

## 2014-09-04 LAB — LIPID PANEL
CHOLESTEROL: 146 mg/dL (ref 0–200)
HDL: 41 mg/dL (ref >39.00)
LDL Cholesterol: 74 mg/dL (ref 0–99)
NonHDL: 105
Total CHOL/HDL Ratio: 4
Triglycerides: 153 mg/dL — ABNORMAL HIGH (ref 0.0–149.0)
VLDL: 30.6 mg/dL (ref 0.0–40.0)

## 2014-09-04 LAB — COMPREHENSIVE METABOLIC PANEL
ALT: 34 U/L (ref 0–35)
AST: 19 U/L (ref 0–37)
Albumin: 4 g/dL (ref 3.5–5.2)
Alkaline Phosphatase: 47 U/L (ref 39–117)
BUN: 20 mg/dL (ref 6–23)
CO2: 27 meq/L (ref 19–32)
Calcium: 9.4 mg/dL (ref 8.4–10.5)
Chloride: 100 mEq/L (ref 96–112)
Creatinine, Ser: 0.93 mg/dL (ref 0.40–1.20)
GFR: 69.33 mL/min (ref >60.00)
Glucose, Bld: 112 mg/dL — ABNORMAL HIGH (ref 70–99)
POTASSIUM: 4.3 meq/L (ref 3.5–5.1)
SODIUM: 133 meq/L — AB (ref 135–145)
Total Bilirubin: 0.4 mg/dL (ref 0.2–1.2)
Total Protein: 6.4 g/dL (ref 6.0–8.3)

## 2014-09-04 LAB — CBC WITH DIFFERENTIAL/PLATELET
Basophils Absolute: 0 10*3/uL (ref 0.0–0.1)
Basophils Relative: 0.4 % (ref 0.0–3.0)
EOS PCT: 8.8 % — AB (ref 0.0–5.0)
Eosinophils Absolute: 0.7 10*3/uL (ref 0.0–0.7)
HEMATOCRIT: 40.5 % (ref 36.0–46.0)
Hemoglobin: 14 g/dL (ref 12.0–15.0)
LYMPHS ABS: 2.5 10*3/uL (ref 0.7–4.0)
LYMPHS PCT: 30.5 % (ref 12.0–46.0)
MCHC: 34.5 g/dL (ref 30.0–36.0)
MCV: 89.8 fl (ref 78.0–100.0)
MONOS PCT: 6.5 % (ref 3.0–12.0)
Monocytes Absolute: 0.5 10*3/uL (ref 0.1–1.0)
NEUTROS ABS: 4.3 10*3/uL (ref 1.4–7.7)
Neutrophils Relative %: 53.8 % (ref 43.0–77.0)
Platelets: 275 10*3/uL (ref 150.0–400.0)
RBC: 4.51 Mil/uL (ref 3.87–5.11)
RDW: 13.2 % (ref 11.5–15.5)
WBC: 8.1 10*3/uL (ref 4.0–10.5)

## 2014-09-04 LAB — MICROALBUMIN / CREATININE URINE RATIO
Creatinine,U: 76.5 mg/dL
Microalb Creat Ratio: 0.9 mg/g (ref 0.0–30.0)

## 2014-09-04 LAB — TSH: TSH: 3.47 u[IU]/mL (ref 0.35–4.50)

## 2014-09-04 LAB — HEMOGLOBIN A1C: HEMOGLOBIN A1C: 5.2 % (ref 4.6–6.5)

## 2014-09-11 ENCOUNTER — Other Ambulatory Visit (HOSPITAL_COMMUNITY)
Admission: RE | Admit: 2014-09-11 | Discharge: 2014-09-11 | Disposition: A | Discharge status: Home / Self Care | Payer: BLUE CROSS/BLUE SHIELD | Source: Ambulatory Visit | Attending: Family Medicine | Admitting: Family Medicine

## 2014-09-11 ENCOUNTER — Ambulatory Visit (INDEPENDENT_AMBULATORY_CARE_PROVIDER_SITE_OTHER): Payer: BLUE CROSS/BLUE SHIELD | Admitting: Family Medicine

## 2014-09-11 ENCOUNTER — Encounter: Payer: Self-pay | Admitting: Family Medicine

## 2014-09-11 VITALS — BP 114/66 | HR 76 | Temp 97.9°F | Ht 63.0 in | Wt 201.8 lb

## 2014-09-11 DIAGNOSIS — Z1151 Encounter for screening for human papillomavirus (HPV): Secondary | ICD-10-CM | POA: Diagnosis present

## 2014-09-11 DIAGNOSIS — N760 Acute vaginitis: Secondary | ICD-10-CM | POA: Diagnosis present

## 2014-09-11 DIAGNOSIS — K5900 Constipation, unspecified: Secondary | ICD-10-CM | POA: Diagnosis not present

## 2014-09-11 DIAGNOSIS — Z113 Encounter for screening for infections with a predominantly sexual mode of transmission: Secondary | ICD-10-CM | POA: Diagnosis present

## 2014-09-11 DIAGNOSIS — E785 Hyperlipidemia, unspecified: Secondary | ICD-10-CM

## 2014-09-11 DIAGNOSIS — Z01419 Encounter for gynecological examination (general) (routine) without abnormal findings: Secondary | ICD-10-CM | POA: Insufficient documentation

## 2014-09-11 DIAGNOSIS — F909 Attention-deficit hyperactivity disorder, unspecified type: Secondary | ICD-10-CM

## 2014-09-11 DIAGNOSIS — Z0000 Encounter for general adult medical examination without abnormal findings: Secondary | ICD-10-CM | POA: Diagnosis not present

## 2014-09-11 DIAGNOSIS — Z1239 Encounter for other screening for malignant neoplasm of breast: Secondary | ICD-10-CM

## 2014-09-11 DIAGNOSIS — F329 Major depressive disorder, single episode, unspecified: Secondary | ICD-10-CM | POA: Diagnosis not present

## 2014-09-11 DIAGNOSIS — I10 Essential (primary) hypertension: Secondary | ICD-10-CM

## 2014-09-11 DIAGNOSIS — F32A Depression, unspecified: Secondary | ICD-10-CM

## 2014-09-11 DIAGNOSIS — E119 Type 2 diabetes mellitus without complications: Secondary | ICD-10-CM | POA: Diagnosis not present

## 2014-09-11 DIAGNOSIS — F988 Other specified behavioral and emotional disorders with onset usually occurring in childhood and adolescence: Secondary | ICD-10-CM

## 2014-09-11 MED ORDER — METFORMIN HCL 500 MG PO TABS: 500.0000 mg | ORAL_TABLET | Freq: Two times a day (BID) | ORAL | Status: DC

## 2014-09-11 MED ORDER — METFORMIN HCL 500 MG PO TABS: 500.0000 mg | ORAL_TABLET | Freq: Every day | ORAL | Status: DC

## 2014-09-11 MED ORDER — LISINOPRIL 5 MG PO TABS: 5.0000 mg | ORAL_TABLET | Freq: Every day | ORAL | Status: DC

## 2014-09-11 NOTE — Assessment & Plan Note (Signed)
New- likely due to changes in her diet. Push water, try miralax prn constipation. Call or return to clinic prn if these symptoms worsen or fail to improve as anticipated. The patient indicates understanding of these issues and agrees with the plan.

## 2014-09-11 NOTE — Progress Notes (Signed)
Pre visit review using our clinic review tool, if applicable. No additional management support is needed unless otherwise documented below in the visit note. 

## 2014-09-11 NOTE — Patient Instructions (Addendum)
Good to see you. Please schedule your eye exam.  Try miralax over the counter for constipation.  Please call to set up your mammogram.

## 2014-09-11 NOTE — Assessment & Plan Note (Signed)
Under great control with her life style changes and weight loss. Decrease metformin to 500 mg twice daily. If she continues to have episodes of hypoglycemia, I have instructed her to decrease Metformin to 500 mg daily.  As she continues to lose weight, may need to d/c Metformin. The patient indicates understanding of these issues and agrees with the plan.

## 2014-09-11 NOTE — Assessment & Plan Note (Signed)
With symptoms of hypotension somewhat expected with significant weight loss. Decrease lisinopril to 5 mg daily for renal protection.  If she has more dizziness, she will let me know.

## 2014-09-11 NOTE — Assessment & Plan Note (Signed)
Well controlled. At goal for a diabetic.  Continue current dose of pravachol for now but with continued weight loss, will likely need to decrease this dose. The patient indicates understanding of these issues and agrees with the plan.

## 2014-09-11 NOTE — Progress Notes (Signed)
Subjective:    Patient ID: Samantha Moses, female    DOB: Mar 27, 1971, 44 y.o.   MRN: [id number]  HPI  Very pleasant 44 yo female here for CPX, follow up of chronic medical conditions and constipation. Last pap smear 02/13/12 Lovie [facility]adamia).  No h/o abnormal pap smears. Mammogram 09/12/13  On OCPs for menorrhagia. No family h/o blood clots or cancer other than an aunt with breast CA (paternal) and cousin. Non smoker.  Doing very well.  Joined weight watchers and has already lost 25 pounds! Wt Readings from Last 3 Encounters:  09/11/14 201 lb 12 oz (91.513 kg)  02/20/14 227 lb 8 oz (103.193 kg)  09/03/13 228 lb 8 oz (103.647 kg)    DM- diagnosed around the year 2000.  On Metformin 1000 mg twice daily.  Checks her fasting FSBS daily- fasting running between 71-110. Has had some episodes of hypoglycemia. Due for eye exam. Pneumovax received at work in 04/2014.  Lab Results  Component Value Date   HGBA1C 5.2 09/04/2014    HLD- on Pravastatin 40 mg daily and has been for over 10 years.  Denies any myalgias. Lab Results  Component Value Date   CHOL 146 09/04/2014   HDL 41.00 09/04/2014   LDLCALC 74 09/04/2014   LDLDIRECT 115.6 08/22/2012   TRIG 153.0* 09/04/2014   CHOLHDL 4 09/04/2014     HTN-  On Lisinopril 10 mg daily.  Was having some dizziness standing from a seated position as she has lost weight.  Started cutting it in half and feels better. Denies any HA, blurred vision, CP or SOB. Lab Results  Component Value Date   CREATININE 0.93 09/04/2014     Depression- sees Dr. Nicolasa Ducking.  Has been stable on Wellbutrin.  ADD- also followed by Dr. Nicolasa Ducking.  On Adderall.  Has noticed some constipation past few weeks- hard stools, every other day.  No blood in stool.  Has tried senna with some relief.   Lab Results  Component Value Date   ALT 34 09/04/2014   AST 19 09/04/2014   ALKPHOS 47 09/04/2014   BILITOT 0.4 09/04/2014    Patient Active Problem List   Diagnosis Date  Noted  . Constipation 09/11/2014  . Routine general medical examination at a health care facility 09/03/2013  . Allergic rhinitis 08/22/2012  . Menorrhagia 08/22/2012  . Hyperlipidemia   . Diabetes mellitus without complication   . Hypertension   . Depression   . ADD (attention deficit disorder)    Past Medical History  Diagnosis Date  . Hyperlipidemia   . Diabetes mellitus without complication   . Hypertension   . Depression   . ADD (attention deficit disorder)   . History of seizures    History reviewed. No pertinent past surgical history. History  Substance Use Topics  . Smoking status: Never Smoker   . Smokeless tobacco: Never Used  . Alcohol Use: Yes     Comment: rarely   Family History  Problem Relation Age of Onset  . Hyperlipidemia Mother   . Hypertension Mother   . Hyperlipidemia Father   . Hypertension Father   . Diabetes Father   . Cancer Maternal Aunt 40    breast CA   Allergies  Allergen Reactions  . Sulfa Antibiotics Anaphylaxis  . Doxycycline     rash  . Cefzil [Cefprozil] Rash   Current Outpatient Prescriptions on File Prior to Visit  Medication Sig Dispense Refill  . albuterol (PROVENTIL HFA;VENTOLIN HFA) 108 (90 BASE) MCG/ACT inhaler  Inhale 2 puffs into the lungs every 4 (four) hours as needed for wheezing.    Marland Kitchen amphetamine-dextroamphetamine (ADDERALL XR) 20 MG 24 hr capsule Take 20 mg by mouth every morning.    Marland Kitchen amphetamine-dextroamphetamine (ADDERALL) 10 MG tablet Take 10 mg by mouth daily with breakfast.    . buPROPion (WELLBUTRIN SR) 150 MG 12 hr tablet Take 150 mg by mouth 2 (two) times daily.    . fexofenadine (ALLEGRA) 180 MG tablet Take 180 mg by mouth daily.    Marland Kitchen lisinopril (PRINIVIL,ZESTRIL) 10 MG tablet Take 1 tablet (10 mg total) by mouth daily. 90 tablet 3  . metFORMIN (GLUCOPHAGE) 1000 MG tablet TAKE 1 TABLET BY MOUTH TWICE DAILY WITH A MEAL 180 tablet 0  . montelukast (SINGULAIR) 10 MG tablet TAKE 1 TABLET BY MOUTH EVERY NIGHT AT  BEDTIME 90 tablet 3  . Omega-3 Fatty Acids (FISH OIL TRIPLE STRENGTH) 1400 MG CAPS Take by mouth 2 (two) times daily.    . pravastatin (PRAVACHOL) 40 MG tablet TAKE 1 TABLET BY MOUTH EVERY DAY 30 tablet 2  . TRUETEST TEST test strip USE AS DIRECTED TO TEST BLOOD SUGAR TWICE DAILY 100 each 0   No current facility-administered medications on file prior to visit.   The PMH, PSH, Social History, Family History, Medications, and allergies have been reviewed in [facility], and have been updated if relevant.   Review of Systems  Constitutional: Negative.   HENT: Negative.   Respiratory: Negative.   Cardiovascular: Negative.   Gastrointestinal: Positive for constipation. Negative for nausea, blood in stool, anal bleeding and rectal pain.  Endocrine: Negative.   Genitourinary: Negative.   Musculoskeletal: Negative.   Skin: Negative.   Allergic/Immunologic: Negative.   Neurological: Negative.   Hematological: Negative.   Psychiatric/Behavioral: Negative.   All other systems reviewed and are negative.  \     Objective:   Physical Exam BP 114/66 mmHg  Pulse 76  Temp(Src) 97.9 F (36.6 C) (Oral)  Ht 5\' 3"  (1.6 m)  Wt 201 lb 12 oz (91.513 kg)  BMI 35.75 kg/m2  SpO2 97%  LMP 09/01/2014   General:  Well-developed,well-nourished,in no acute distress; alert,appropriate and cooperative throughout examination Head:  normocephalic and atraumatic.   Eyes:  vision grossly intact, pupils equal, pupils round, and pupils reactive to light.   Ears:  R ear normal and L ear normal.   Nose:  no external deformity.   Mouth:  good dentition.   Neck:  No deformities, masses, or tenderness noted. Breasts:  No mass, nodules, thickening, tenderness, bulging, retraction, inflamation, nipple discharge or skin changes noted.   Lungs:  Normal respiratory effort, chest expands symmetrically. Lungs are clear to auscultation, no crackles or wheezes. Heart:  Normal rate and regular rhythm. S1 and S2 normal without  gallop, murmur, click, rub or other extra sounds. Abdomen:  Bowel sounds positive,abdomen soft and non-tender without masses, organomegaly or hernias noted. Rectal:  no external abnormalities.   Genitalia:  Pelvic Exam:        External: normal female genitalia without lesions or masses        Vagina: normal without lesions or masses        Cervix: normal without lesions or masses        Adnexa: normal bimanual exam without masses or fullness        Uterus: normal by palpation        Pap smear: performed Msk:  No deformity or scoliosis noted of thoracic or lumbar spine.  Extremities:  No clubbing, cyanosis, edema, or deformity noted with normal full range of motion of all joints.   Neurologic:  alert & oriented X3 and gait normal.   Skin:  Intact without suspicious lesions or rashes Cervical Nodes:  No lymphadenopathy noted Axillary Nodes:  No palpable lymphadenopathy Psych:  Cognition and judgment appear intact. Alert and cooperative with normal attention span and concentration. No apparent delusions, illusions, hallucinations      Assessment & Plan:

## 2014-09-11 NOTE — Assessment & Plan Note (Signed)
Reviewed preventive care protocols, scheduled due services, and updated immunizations Discussed nutrition, exercise, diet, and healthy lifestyle.  Pap smear done today. 

## 2014-09-11 NOTE — Assessment & Plan Note (Signed)
Followed by psych. No changes made.

## 2014-09-12 ENCOUNTER — Telehealth: Payer: Self-pay

## 2014-09-15 LAB — CERVICOVAGINAL ANCILLARY ONLY: Bacterial vaginitis: NEGATIVE

## 2014-09-16 ENCOUNTER — Encounter: Payer: Self-pay | Admitting: *Deleted

## 2014-09-16 LAB — CYTOLOGY - PAP

## 2014-09-18 LAB — CERVICOVAGINAL ANCILLARY ONLY: Herpes: NEGATIVE

## 2014-09-25 ENCOUNTER — Encounter: Payer: Self-pay | Admitting: Family Medicine

## 2014-09-25 MED ORDER — GLUCOSE BLOOD VI STRP: ORAL_STRIP | Status: DC

## 2014-10-17 ENCOUNTER — Ambulatory Visit
Admission: RE | Admit: 2014-10-17 | Discharge: 2014-10-17 | Disposition: A | Discharge status: Home / Self Care | Payer: BLUE CROSS/BLUE SHIELD | Source: Ambulatory Visit | Attending: Family Medicine | Admitting: Family Medicine

## 2014-10-17 DIAGNOSIS — Z1239 Encounter for other screening for malignant neoplasm of breast: Secondary | ICD-10-CM

## 2014-10-17 DIAGNOSIS — Z1231 Encounter for screening mammogram for malignant neoplasm of breast: Secondary | ICD-10-CM | POA: Insufficient documentation

## 2014-10-25 ENCOUNTER — Other Ambulatory Visit: Payer: Self-pay | Admitting: Family Medicine

## 2014-11-18 ENCOUNTER — Encounter: Payer: Self-pay | Admitting: Family Medicine

## 2014-11-18 NOTE — Telephone Encounter (Signed)
miralax is not on her medication list. Ok to add and refill?

## 2014-11-25 ENCOUNTER — Encounter: Payer: Self-pay | Admitting: Family Medicine

## 2014-11-26 ENCOUNTER — Other Ambulatory Visit: Payer: Self-pay | Admitting: Internal Medicine

## 2014-11-26 ENCOUNTER — Other Ambulatory Visit: Payer: Self-pay | Admitting: Family Medicine

## 2014-11-26 MED ORDER — POLYETHYLENE GLYCOL 3350 17 GM/SCOOP PO POWD: 1.0000 | Freq: Once | ORAL | Status: DC

## 2014-11-27 ENCOUNTER — Other Ambulatory Visit: Payer: Self-pay | Admitting: Internal Medicine

## 2014-11-27 MED ORDER — NORGESTIM-ETH ESTRAD TRIPHASIC 0.18/0.215/0.25 MG-35 MCG PO TABS: 1.0000 | ORAL_TABLET | Freq: Every day | ORAL | Status: DC

## 2015-01-06 ENCOUNTER — Other Ambulatory Visit: Payer: Self-pay | Admitting: Family Medicine

## 2015-03-31 ENCOUNTER — Encounter: Payer: Self-pay | Admitting: Internal Medicine

## 2015-03-31 ENCOUNTER — Ambulatory Visit (INDEPENDENT_AMBULATORY_CARE_PROVIDER_SITE_OTHER): Payer: BLUE CROSS/BLUE SHIELD | Admitting: Internal Medicine

## 2015-03-31 VITALS — BP 124/88 | HR 84 | Temp 98.2°F | Wt 218.0 lb

## 2015-03-31 DIAGNOSIS — J0110 Acute frontal sinusitis, unspecified: Secondary | ICD-10-CM

## 2015-03-31 MED ORDER — AMOXICILLIN-POT CLAVULANATE 875-125 MG PO TABS: 1.0000 | ORAL_TABLET | Freq: Two times a day (BID) | ORAL | Status: DC

## 2015-03-31 NOTE — Progress Notes (Signed)
HPI  Pt presents to the clinic today with c/o headache, nasal congestion, post nasal drip and left ear pain. This started 2-3 weeks ago. She is not able to blow anything out of her nose. She has an unproductive cough. She denies fever but has had fatigue, chills and body aches. She has tried Sudafed, Human resources officer and Mucinex with minimal relief. She does have a history of seasonal allergies but denies breathing problems. She has not had sick contacts that she is aware of.  Review of Systems    Past Medical History  Diagnosis Date  . Hyperlipidemia   . Diabetes mellitus without complication ([place])   . Hypertension   . Depression   . ADD (attention deficit disorder)   . History of seizures     Family History  Problem Relation Age of Onset  . Hyperlipidemia Mother   . Hypertension Mother   . Hyperlipidemia Father   . Hypertension Father   . Diabetes Father   . Cancer Maternal Aunt 40    breast CA    Social History   Social History  . Marital Status: Single    Spouse Name: N/A  . Number of Children: N/A  . Years of Education: N/A   Occupational History  . Not on file.   Social History Main Topics  . Smoking status: Never Smoker   . Smokeless tobacco: Never Used  . Alcohol Use: 0.0 oz/week    0 Standard drinks or equivalent per week     Comment: rarely  . Drug Use: No  . Sexual Activity: Not on file   Other Topics Concern  . Not on file   Social History Narrative    Allergies  Allergen Reactions  . Sulfa Antibiotics Anaphylaxis  . Doxycycline     rash  . Cefzil [Cefprozil] Rash     Constitutional: Positive headache, fatigue. Denies fever or abrupt weight changes.  HEENT:  Positive facial pain, nasal congestion and sore throat. Denies eye redness, ear pain, ringing in the ears, wax buildup, runny nose or bloody nose. Respiratory: Positive cough. Denies difficulty breathing or shortness of breath.  Cardiovascular: Denies chest pain, chest tightness, palpitations  or swelling in the hands or feet.   No other specific complaints in a complete review of systems (except as listed in HPI above).  Objective:   BP 124/88 mmHg  Pulse 84  Temp(Src) 98.2 F (36.8 C) (Oral)  Wt 218 lb (98.884 kg)  SpO2 98%  General: Appears her stated age, ill appearing in NAD. HEENT: Head: normal shape and size, frontal sinus tenderness noted; Eyes: sclera white, no icterus, conjunctiva pink; Ears: Tm's pink but intact, normal light reflex; Nose: mucosa boggy and moist, septum midline; Throat/Mouth: + PND. Teeth present, mucosa pink and moist, no exudate noted, no lesions or ulcerations noted.  Neck:  No adenopathy noted.  Cardiovascular: Normal rate and rhythm. S1,S2 noted.  No murmur, rubs or gallops noted.  Pulmonary/Chest: Normal effort and positive vesicular breath sounds. No respiratory distress. No wheezes, rales or ronchi noted.      Assessment & Plan:   Acute frontal sinusitis  Can use a Neti Pot which can be purchased from your local drug store. Flonase 2 sprays each nostril for 3 days and then as needed. Augmentin BID for 10 days  RTC as needed or if symptoms persist.

## 2015-03-31 NOTE — Patient Instructions (Signed)

## 2015-03-31 NOTE — Progress Notes (Signed)
Pre visit review using our clinic review tool, if applicable. No additional management support is needed unless otherwise documented below in the visit note. 

## 2015-05-11 ENCOUNTER — Other Ambulatory Visit: Payer: Self-pay | Admitting: Family Medicine

## 2015-05-17 ENCOUNTER — Encounter: Payer: Self-pay | Admitting: Family Medicine

## 2015-05-19 ENCOUNTER — Ambulatory Visit (INDEPENDENT_AMBULATORY_CARE_PROVIDER_SITE_OTHER): Payer: BLUE CROSS/BLUE SHIELD | Admitting: Family Medicine

## 2015-05-19 ENCOUNTER — Encounter: Payer: Self-pay | Admitting: Family Medicine

## 2015-05-19 VITALS — BP 138/82 | HR 81 | Temp 98.0°F | Wt 217.0 lb

## 2015-05-19 DIAGNOSIS — L03312 Cellulitis of back [any part except buttock]: Secondary | ICD-10-CM

## 2015-05-19 DIAGNOSIS — L0390 Cellulitis, unspecified: Secondary | ICD-10-CM | POA: Insufficient documentation

## 2015-05-19 MED ORDER — AMOXICILLIN-POT CLAVULANATE 875-125 MG PO TABS: 1.0000 | ORAL_TABLET | Freq: Two times a day (BID) | ORAL | Status: AC

## 2015-05-19 NOTE — Progress Notes (Signed)
Pre visit review using our clinic review tool, if applicable. No additional management support is needed unless otherwise documented below in the visit note. 

## 2015-05-19 NOTE — Progress Notes (Signed)
Subjective:   Patient ID: Samantha Moses, female    DOB: 12/30/1970, 45 y.o.   MRN: [id number]  Samantha Moses is a pleasant 45 y.o. year old female who presents to clinic today with spot on side  on 05/19/2015  HPI:  Noticed "spot" on her left upper back/side a few days ago.  Now area of redness and warmth around it growing.  Does not remember scratching it or being bitten by anything.  Not draining.  No fevers, chills, nausea or vomiting.  Never had anything like this before.  Multiple abx allergies.  Current Outpatient Prescriptions on File Prior to Visit  Medication Sig Dispense Refill  . albuterol (PROVENTIL HFA;VENTOLIN HFA) 108 (90 BASE) MCG/ACT inhaler Inhale 2 puffs into the lungs every 4 (four) hours as needed for wheezing.    Marland Kitchen amphetamine-dextroamphetamine (ADDERALL XR) 20 MG 24 hr capsule Take 20 mg by mouth every morning.    Marland Kitchen amphetamine-dextroamphetamine (ADDERALL) 10 MG tablet Take 10 mg by mouth daily with breakfast.    . buPROPion (WELLBUTRIN SR) 150 MG 12 hr tablet Take 150 mg by mouth 2 (two) times daily.    . fexofenadine (ALLEGRA) 180 MG tablet Take 180 mg by mouth daily.    Marland Kitchen lisinopril (PRINIVIL,ZESTRIL) 5 MG tablet Take 1 tablet (5 mg total) by mouth daily. 90 tablet 3  . metFORMIN (GLUCOPHAGE) 500 MG tablet Take 1 tablet (500 mg total) by mouth daily with breakfast. 180 tablet 3  . montelukast (SINGULAIR) 10 MG tablet TAKE 1 TABLET BY MOUTH EVERY NIGHT AT BEDTIME 90 tablet 3  . Norgestimate-Ethinyl Estradiol Triphasic 0.18/0.215/0.25 MG-35 MCG tablet Take 1 tablet by mouth daily. 1 Package 11  . Omega-3 Fatty Acids (FISH OIL TRIPLE STRENGTH) 1400 MG CAPS Take by mouth 2 (two) times daily.    . polyethylene glycol powder (GLYCOLAX/MIRALAX) powder Take 255 g by mouth once. 850 g 0  . pravastatin (PRAVACHOL) 40 MG tablet TAKE 1 TABLET BY MOUTH EVERY DAY 30 tablet 4  . TRUE METRIX BLOOD GLUCOSE TEST test strip USE AS DIRECTED TO TEST BLOOD SUGAR TWICE DAILY 50  each 5   No current facility-administered medications on file prior to visit.    Allergies  Allergen Reactions  . Sulfa Antibiotics Anaphylaxis  . Doxycycline     rash  . Cefzil [Cefprozil] Rash    Past Medical History  Diagnosis Date  . Hyperlipidemia   . Diabetes mellitus without complication ([place])   . Hypertension   . Depression   . ADD (attention deficit disorder)   . History of seizures     No past surgical history on file.  Family History  Problem Relation Age of Onset  . Hyperlipidemia Mother   . Hypertension Mother   . Hyperlipidemia Father   . Hypertension Father   . Diabetes Father   . Cancer Maternal Aunt 40    breast CA    Social History   Social History  . Marital Status: Single    Spouse Name: N/A  . Number of Children: N/A  . Years of Education: N/A   Occupational History  . Not on file.   Social History Main Topics  . Smoking status: Never Smoker   . Smokeless tobacco: Never Used  . Alcohol Use: 0.0 oz/week    0 Standard drinks or equivalent per week     Comment: rarely  . Drug Use: No  . Sexual Activity: Not on file   Other Topics Concern  .  Not on file   Social History Narrative   The PMH, PSH, Social History, Family History, Medications, and allergies have been reviewed in [facility], and have been updated if relevant.   Review of Systems  Constitutional: Negative for fever.  Gastrointestinal: Negative.   Musculoskeletal: Negative.   Skin: Positive for rash.  Psychiatric/Behavioral: Negative.   All other systems reviewed and are negative.      Objective:    BP 138/82 mmHg  Pulse 81  Temp(Src) 98 F (36.7 C) (Oral)  Wt 217 lb (98.431 kg)  SpO2 98%  LMP 05/07/2015   Physical Exam  Constitutional: She is oriented to person, place, and time. She appears well-developed and well-nourished. No distress.  HENT:  Head: Normocephalic.  Eyes: Conjunctivae are normal.  Cardiovascular: Normal rate.   Pulmonary/Chest: Effort  normal.  Musculoskeletal: Normal range of motion.  Neurological: She is alert and oriented to person, place, and time. No cranial nerve deficit.  Skin:  A cm circular area with central opening, non fluctuant with surrounding warmth and erythema.  Psychiatric: She has a normal mood and affect. Her behavior is normal. Judgment and thought content normal.  Nursing note and vitals reviewed.         Assessment & Plan:   Cellulitis of back except buttock No Follow-up on file.

## 2015-05-19 NOTE — Patient Instructions (Signed)

## 2015-05-19 NOTE — Assessment & Plan Note (Signed)
?   Infected insect bite. Non fluctuant- no indication for I and D. Treat with oral abx- multiple allergies- can tolerate Augmentin. eRx sent. Call or return to clinic prn if these symptoms worsen or fail to improve as anticipated. The patient indicates understanding of these issues and agrees with the plan.

## 2015-06-05 ENCOUNTER — Other Ambulatory Visit: Payer: Self-pay | Admitting: Family Medicine

## 2015-06-05 ENCOUNTER — Telehealth: Payer: Self-pay

## 2015-06-05 NOTE — Telephone Encounter (Signed)
[facility] left v/m requesting cb with instructions for test strips.

## 2015-06-08 NOTE — Telephone Encounter (Signed)
She is not on insulin.   3 times weekly and when she feels shaky/hypoglycemic prn should be sufficient.

## 2015-06-08 NOTE — Telephone Encounter (Signed)
Brooke at pharmacy notified as instructed by telephone.

## 2015-06-08 NOTE — Telephone Encounter (Signed)
Please advise how often patient should check her blood sugar?

## 2015-06-11 ENCOUNTER — Other Ambulatory Visit: Payer: Self-pay | Admitting: Family Medicine

## 2015-09-09 ENCOUNTER — Other Ambulatory Visit: Payer: Self-pay | Admitting: Family Medicine

## 2015-09-15 ENCOUNTER — Other Ambulatory Visit: Payer: Self-pay | Admitting: Family Medicine

## 2015-09-16 ENCOUNTER — Other Ambulatory Visit: Payer: Self-pay | Admitting: Family Medicine

## 2015-09-16 NOTE — Telephone Encounter (Signed)
[facility] left v/m that pts ins will only pay for 90 day rx and request 90 day rx for lisinopril. Pt would have to pay cash out of pocket for # 30. Left note office visit required for additional refills. Refilled # 90 per protocol. Last annual exam on 09/11/14.

## 2015-11-03 LAB — HM DIABETES EYE EXAM

## 2015-11-16 ENCOUNTER — Other Ambulatory Visit: Payer: Self-pay | Admitting: Internal Medicine

## 2015-11-17 ENCOUNTER — Other Ambulatory Visit: Payer: Self-pay | Admitting: Family Medicine

## 2015-12-06 ENCOUNTER — Other Ambulatory Visit: Payer: Self-pay | Admitting: Family Medicine

## 2015-12-28 ENCOUNTER — Other Ambulatory Visit: Payer: Self-pay | Admitting: Family Medicine

## 2015-12-28 DIAGNOSIS — I10 Essential (primary) hypertension: Secondary | ICD-10-CM

## 2015-12-28 DIAGNOSIS — Z01419 Encounter for gynecological examination (general) (routine) without abnormal findings: Secondary | ICD-10-CM | POA: Insufficient documentation

## 2015-12-28 DIAGNOSIS — E119 Type 2 diabetes mellitus without complications: Secondary | ICD-10-CM

## 2015-12-28 DIAGNOSIS — E785 Hyperlipidemia, unspecified: Secondary | ICD-10-CM

## 2015-12-31 ENCOUNTER — Other Ambulatory Visit: Payer: BLUE CROSS/BLUE SHIELD

## 2016-01-07 ENCOUNTER — Encounter: Payer: BLUE CROSS/BLUE SHIELD | Admitting: Family Medicine

## 2016-01-11 ENCOUNTER — Encounter: Payer: Self-pay | Admitting: Family Medicine

## 2016-01-12 ENCOUNTER — Other Ambulatory Visit: Payer: Self-pay | Admitting: Family Medicine

## 2016-01-12 MED ORDER — LISINOPRIL 5 MG PO TABS: ORAL_TABLET | ORAL | 0 refills | Status: DC

## 2016-01-12 MED ORDER — METFORMIN HCL 500 MG PO TABS: 500.0000 mg | ORAL_TABLET | Freq: Two times a day (BID) | ORAL | 0 refills | Status: DC

## 2016-01-15 NOTE — Telephone Encounter (Signed)
Pt has not had f/u since 08/2014 but has a CPE scheduled for 01/2016

## 2016-01-19 ENCOUNTER — Other Ambulatory Visit (INDEPENDENT_AMBULATORY_CARE_PROVIDER_SITE_OTHER): Payer: BLUE CROSS/BLUE SHIELD

## 2016-01-19 DIAGNOSIS — E119 Type 2 diabetes mellitus without complications: Secondary | ICD-10-CM

## 2016-01-19 DIAGNOSIS — Z01419 Encounter for gynecological examination (general) (routine) without abnormal findings: Secondary | ICD-10-CM | POA: Diagnosis not present

## 2016-01-19 DIAGNOSIS — E785 Hyperlipidemia, unspecified: Secondary | ICD-10-CM | POA: Diagnosis not present

## 2016-01-19 LAB — COMPREHENSIVE METABOLIC PANEL
ALBUMIN: 3.8 g/dL (ref 3.5–5.2)
ALK PHOS: 37 U/L — AB (ref 39–117)
ALT: 57 U/L — ABNORMAL HIGH (ref 0–35)
AST: 31 U/L (ref 0–37)
BUN: 15 mg/dL (ref 6–23)
CO2: 27 mEq/L (ref 19–32)
Calcium: 9.1 mg/dL (ref 8.4–10.5)
Chloride: 101 mEq/L (ref 96–112)
Creatinine, Ser: 0.97 mg/dL (ref 0.40–1.20)
GFR: 65.64 mL/min (ref >60.00)
Glucose, Bld: 158 mg/dL — ABNORMAL HIGH (ref 70–99)
Potassium: 4.7 mEq/L (ref 3.5–5.1)
Sodium: 137 mEq/L (ref 135–145)
TOTAL PROTEIN: 6.7 g/dL (ref 6.0–8.3)
Total Bilirubin: 0.4 mg/dL (ref 0.2–1.2)

## 2016-01-19 LAB — LIPID PANEL
CHOLESTEROL: 197 mg/dL (ref 0–200)
HDL: 47.9 mg/dL (ref >39.00)
LDL Cholesterol: 117 mg/dL — ABNORMAL HIGH (ref 0–99)
NonHDL: 148.67
Total CHOL/HDL Ratio: 4
Triglycerides: 160 mg/dL — ABNORMAL HIGH (ref 0.0–149.0)
VLDL: 32 mg/dL (ref 0.0–40.0)

## 2016-01-19 LAB — CBC WITH DIFFERENTIAL/PLATELET
BASOS ABS: 0 10*3/uL (ref 0.0–0.1)
BASOS PCT: 0.7 % (ref 0.0–3.0)
EOS ABS: 1 10*3/uL — AB (ref 0.0–0.7)
Eosinophils Relative: 13.9 % — ABNORMAL HIGH (ref 0.0–5.0)
HCT: 41.9 % (ref 36.0–46.0)
Hemoglobin: 14.4 g/dL (ref 12.0–15.0)
Lymphocytes Relative: 32.6 % (ref 12.0–46.0)
Lymphs Abs: 2.4 10*3/uL (ref 0.7–4.0)
MCHC: 34.5 g/dL (ref 30.0–36.0)
MCV: 89.5 fl (ref 78.0–100.0)
Monocytes Absolute: 0.4 10*3/uL (ref 0.1–1.0)
Monocytes Relative: 6 % (ref 3.0–12.0)
Neutro Abs: 3.4 10*3/uL (ref 1.4–7.7)
Neutrophils Relative %: 46.8 % (ref 43.0–77.0)
PLATELETS: 259 10*3/uL (ref 150.0–400.0)
RBC: 4.68 Mil/uL (ref 3.87–5.11)
RDW: 12.9 % (ref 11.5–15.5)
WBC: 7.4 10*3/uL (ref 4.0–10.5)

## 2016-01-19 LAB — TSH: TSH: 3.29 u[IU]/mL (ref 0.35–4.50)

## 2016-01-19 LAB — HEMOGLOBIN A1C: HEMOGLOBIN A1C: 6.5 % (ref 4.6–6.5)

## 2016-01-28 ENCOUNTER — Ambulatory Visit (INDEPENDENT_AMBULATORY_CARE_PROVIDER_SITE_OTHER): Payer: BLUE CROSS/BLUE SHIELD | Admitting: Family Medicine

## 2016-01-28 ENCOUNTER — Other Ambulatory Visit: Payer: Self-pay | Admitting: Family Medicine

## 2016-01-28 ENCOUNTER — Encounter: Payer: Self-pay | Admitting: Family Medicine

## 2016-01-28 VITALS — BP 120/78 | HR 88 | Temp 98.3°F | Ht 63.0 in | Wt 226.5 lb

## 2016-01-28 DIAGNOSIS — E785 Hyperlipidemia, unspecified: Secondary | ICD-10-CM

## 2016-01-28 DIAGNOSIS — I10 Essential (primary) hypertension: Secondary | ICD-10-CM

## 2016-01-28 DIAGNOSIS — Z01419 Encounter for gynecological examination (general) (routine) without abnormal findings: Secondary | ICD-10-CM

## 2016-01-28 DIAGNOSIS — E119 Type 2 diabetes mellitus without complications: Secondary | ICD-10-CM | POA: Diagnosis not present

## 2016-01-28 DIAGNOSIS — J309 Allergic rhinitis, unspecified: Secondary | ICD-10-CM | POA: Diagnosis not present

## 2016-01-28 DIAGNOSIS — Z1231 Encounter for screening mammogram for malignant neoplasm of breast: Secondary | ICD-10-CM

## 2016-01-28 MED ORDER — LEVOCETIRIZINE DIHYDROCHLORIDE 5 MG PO TABS: 5.0000 mg | ORAL_TABLET | Freq: Every evening | ORAL | 6 refills | Status: DC

## 2016-01-28 NOTE — Patient Instructions (Addendum)
Great to see you.  Please stop Allegra.  Start Xyzal.  Continue singulair.  We are referring you to an allergist.  Please schedule your mammogram.

## 2016-01-28 NOTE — Progress Notes (Signed)
Subjective:    Patient ID: Samantha Moses, female    DOB: May 08, 1970, 45 y.o.   MRN: [id number]  HPI  Very pleasant 45 yo female here for CPX, follow up of chronic medical conditions.  Last pap smear 09/11/14 (done by me).  No h/o abnormal pap smears. Mammogram 10/17/14  On OCPs for menorrhagia.  No family h/o blood clots or cancer other than an aunt with breast CA (paternal) and cousin. Non smoker.  Allergic rhinitis- chest congestion and nasal drainage has deteriorated despite taking allegra and singulair. Did receive allergy shots years ago.  Wt Readings from Last 3 Encounters:  01/28/16 226 lb 8 oz (102.7 kg)  05/19/15 217 lb (98.4 kg)  03/31/15 218 lb (98.9 kg)    DM- diagnosed around the year 2000.  On Metformin 1000 mg twice daily but had not been compliant with taking it until recently. Has had some episodes of hypoglycemia. Due for eye exam. Pneumovax received at work in 04/2014.  Lab Results  Component Value Date   HGBA1C 6.5 01/19/2016    HLD- on Pravastatin 40 mg daily and has been for over 10 years but admits to not eating well recently.  Denies any myalgias. Lab Results  Component Value Date   CHOL 197 01/19/2016   HDL 47.90 01/19/2016   LDLCALC 117 (H) 01/19/2016   LDLDIRECT 115.6 08/22/2012   TRIG 160.0 (H) 01/19/2016   CHOLHDL 4 01/19/2016     HTN-  On Lisinopril 10 mg daily.  Denies any HA, blurred vision, CP or SOB. Lab Results  Component Value Date   CREATININE 0.97 01/19/2016     Depression- sees Dr. Nicolasa Ducking.  Has been stable on Wellbutrin.  ADD- also followed by Dr. Nicolasa Ducking.  On Adderall.   Lab Results  Component Value Date   ALT 57 (H) 01/19/2016   AST 31 01/19/2016   ALKPHOS 37 (L) 01/19/2016   BILITOT 0.4 01/19/2016    Patient Active Problem List   Diagnosis Date Noted  . Well woman exam 12/28/2015  . Constipation 09/11/2014  . Allergic rhinitis 08/22/2012  . Menorrhagia 08/22/2012  . Hyperlipidemia   . Diabetes mellitus without  complication ([place])   . Hypertension   . Depression   . ADD (attention deficit disorder)    Past Medical History:  Diagnosis Date  . ADD (attention deficit disorder)   . Depression   . Diabetes mellitus without complication ([place])   . History of seizures   . Hyperlipidemia   . Hypertension    No past surgical history on file. Social History  Substance Use Topics  . Smoking status: Never Smoker  . Smokeless tobacco: Never Used  . Alcohol use 0.0 oz/week     Comment: rarely   Family History  Problem Relation Age of Onset  . Hyperlipidemia Mother   . Hypertension Mother   . Hyperlipidemia Father   . Hypertension Father   . Diabetes Father   . Cancer Maternal Aunt 40    breast CA   Allergies  Allergen Reactions  . Sulfa Antibiotics Anaphylaxis  . Doxycycline     rash  . Cefzil [Cefprozil] Rash   Current Outpatient Prescriptions on File Prior to Visit  Medication Sig Dispense Refill  . amphetamine-dextroamphetamine (ADDERALL XR) 20 MG 24 hr capsule Take 20 mg by mouth every morning.    Marland Kitchen amphetamine-dextroamphetamine (ADDERALL) 10 MG tablet Take 10 mg by mouth daily with breakfast.    . buPROPion (WELLBUTRIN SR) 150 MG 12 hr tablet  Take 150 mg by mouth 2 (two) times daily.    Marland Kitchen glucose blood (TRUE METRIX BLOOD GLUCOSE TEST) test strip Dx E11.9 100 each 2  . lisinopril (PRINIVIL,ZESTRIL) 5 MG tablet TAKE 1 TABLET BY MOUTH DAILY 30 tablet 0  . metFORMIN (GLUCOPHAGE) 500 MG tablet TAKE 1 TABLET BY MOUTH TWICE DAILY WITH A MEAL 60 tablet 0  . montelukast (SINGULAIR) 10 MG tablet TAKE 1 TABLET BY MOUTH EVERY NIGHT AT BEDTIME 90 tablet 0  . montelukast (SINGULAIR) 10 MG tablet TAKE 1 TABLET BY MOUTH EVERY NIGHT AT BEDTIME 90 tablet 0  . Norgestimate-Ethinyl Estradiol Triphasic (TRINESSA, 28,) 0.18/0.215/0.25 MG-35 MCG tablet Take 1 tablet by mouth daily. MUST SCHEDULE ANNUAL EXAM FOR FURTHER REFILLS 28 tablet 1  . Omega-3 Fatty Acids (FISH OIL TRIPLE STRENGTH) 1400 MG CAPS Take  by mouth 2 (two) times daily.    . polyethylene glycol powder (GLYCOLAX/MIRALAX) powder Take 255 g by mouth once. 850 g 0  . pravastatin (PRAVACHOL) 40 MG tablet TAKE 1 TABLET BY MOUTH EVERY DAY 30 tablet 4  . albuterol (PROVENTIL HFA;VENTOLIN HFA) 108 (90 BASE) MCG/ACT inhaler Inhale 2 puffs into the lungs every 4 (four) hours as needed for wheezing.     No current facility-administered medications on file prior to visit.    The PMH, PSH, Social History, Family History, Medications, and allergies have been reviewed in [facility], and have been updated if relevant.   Review of Systems  Constitutional: Negative.   HENT: Positive for rhinorrhea.   Respiratory: Positive for cough. Negative for wheezing.   Cardiovascular: Negative.   Gastrointestinal: Positive for constipation. Negative for anal bleeding, blood in stool, nausea and rectal pain.  Endocrine: Negative.   Genitourinary: Negative.   Musculoskeletal: Negative.   Skin: Negative.   Allergic/Immunologic: Negative.   Neurological: Negative.   Hematological: Negative.   Psychiatric/Behavioral: Negative.   All other systems reviewed and are negative.  \     Objective:   Physical Exam BP 120/78   Pulse 88   Temp 98.3 F (36.8 C) (Oral)   Ht 5\' 3"  (1.6 m)   Wt 226 lb 8 oz (102.7 kg)   LMP 01/14/2016   SpO2 97%   BMI 40.12 kg/m    General:  Well-developed,well-nourished,in no acute distress; alert,appropriate and cooperative throughout examination Head:  normocephalic and atraumatic.   Eyes:  vision grossly intact, pupils equal, pupils round, and pupils reactive to light.   Ears:  R ear normal and L ear normal.   Nose:  no external deformity.   Mouth:  good dentition.   +PND Neck:  No deformities, masses, or tenderness noted. Breasts:  No mass, nodules, thickening, tenderness, bulging, retraction, inflamation, nipple discharge or skin changes noted.  Lungs:  Normal respiratory effort, chest expands symmetrically. Lungs are  clear to auscultation, no crackles or wheezes. Heart:  Normal rate and regular rhythm. S1 and S2 normal without gallop, murmur, click, rub or other extra sounds. Abdomen:  Bowel sounds positive,abdomen soft and non-tender without masses, organomegaly or hernias noted. Msk:  No deformity or scoliosis noted of thoracic or lumbar spine.   Extremities:  No clubbing, cyanosis, edema, or deformity noted with normal full range of motion of all joints.   Neurologic:  alert & oriented X3 and gait normal.   Skin:  Intact without suspicious lesions or rashes Cervical Nodes:  No lymphadenopathy noted Axillary Nodes:  No palpable lymphadenopathy Psych:  Cognition and judgment appear intact. Alert and cooperative with normal attention  span and concentration. No apparent delusions, illusions, hallucinations      Assessment & Plan:

## 2016-01-28 NOTE — Assessment & Plan Note (Signed)
Deteriorated. She has restarted metformin. No changes made today.

## 2016-01-28 NOTE — Assessment & Plan Note (Signed)
Deteriorated. Refer to allergist. D/c allegra. Start xyzal (eRx sent). Continue singulair. The patient indicates understanding of these issues and agrees with the plan.

## 2016-01-28 NOTE — Assessment & Plan Note (Signed)
Normotensive 

## 2016-01-28 NOTE — Assessment & Plan Note (Signed)
Deteriorated. Discussed dietary changes. Continue current dose of pravastain.

## 2016-01-28 NOTE — Assessment & Plan Note (Signed)
Reviewed preventive care protocols, scheduled due services, and updated immunizations Discussed nutrition, exercise, diet, and healthy lifestyle.  Pt to call to schedule mammogram. 

## 2016-02-02 ENCOUNTER — Other Ambulatory Visit: Payer: Self-pay | Admitting: Family Medicine

## 2016-02-04 ENCOUNTER — Encounter: Payer: Self-pay | Admitting: Family Medicine

## 2016-02-04 ENCOUNTER — Other Ambulatory Visit: Payer: Self-pay | Admitting: Family Medicine

## 2016-02-04 MED ORDER — METFORMIN HCL 500 MG PO TABS: 500.0000 mg | ORAL_TABLET | Freq: Two times a day (BID) | ORAL | 1 refills | Status: DC

## 2016-02-04 MED ORDER — LISINOPRIL 5 MG PO TABS: 5.0000 mg | ORAL_TABLET | Freq: Every day | ORAL | 2 refills | Status: DC

## 2016-02-04 MED ORDER — MONTELUKAST SODIUM 10 MG PO TABS: 10.0000 mg | ORAL_TABLET | Freq: Every day | ORAL | 3 refills | Status: DC

## 2016-02-12 ENCOUNTER — Other Ambulatory Visit: Payer: Self-pay | Admitting: Family Medicine

## 2016-02-15 ENCOUNTER — Ambulatory Visit
Admission: RE | Admit: 2016-02-15 | Discharge: 2016-02-15 | Disposition: A | Discharge status: Home / Self Care | Payer: BLUE CROSS/BLUE SHIELD | Source: Ambulatory Visit | Attending: Family Medicine | Admitting: Family Medicine

## 2016-02-15 DIAGNOSIS — Z1231 Encounter for screening mammogram for malignant neoplasm of breast: Secondary | ICD-10-CM | POA: Diagnosis not present

## 2016-02-16 ENCOUNTER — Encounter: Payer: Self-pay | Admitting: Family Medicine

## 2016-02-16 MED ORDER — PRAVASTATIN SODIUM 40 MG PO TABS: 40.0000 mg | ORAL_TABLET | Freq: Every day | ORAL | 1 refills | Status: DC

## 2016-02-24 ENCOUNTER — Encounter: Payer: Self-pay | Admitting: Family Medicine

## 2016-03-05 ENCOUNTER — Other Ambulatory Visit: Payer: Self-pay | Admitting: Family Medicine

## 2016-07-30 ENCOUNTER — Other Ambulatory Visit: Payer: Self-pay | Admitting: Family Medicine

## 2016-08-21 ENCOUNTER — Other Ambulatory Visit: Payer: Self-pay | Admitting: Family Medicine

## 2016-09-26 DIAGNOSIS — F4011 Social phobia, generalized: Secondary | ICD-10-CM | POA: Diagnosis not present

## 2016-09-26 DIAGNOSIS — F900 Attention-deficit hyperactivity disorder, predominantly inattentive type: Secondary | ICD-10-CM | POA: Diagnosis not present

## 2016-09-26 DIAGNOSIS — F3341 Major depressive disorder, recurrent, in partial remission: Secondary | ICD-10-CM | POA: Diagnosis not present

## 2016-10-16 ENCOUNTER — Other Ambulatory Visit: Payer: Self-pay | Admitting: Family Medicine

## 2016-10-31 DIAGNOSIS — F4011 Social phobia, generalized: Secondary | ICD-10-CM | POA: Diagnosis not present

## 2016-10-31 DIAGNOSIS — F3341 Major depressive disorder, recurrent, in partial remission: Secondary | ICD-10-CM | POA: Diagnosis not present

## 2016-10-31 DIAGNOSIS — F900 Attention-deficit hyperactivity disorder, predominantly inattentive type: Secondary | ICD-10-CM | POA: Diagnosis not present

## 2016-11-05 ENCOUNTER — Other Ambulatory Visit: Payer: Self-pay | Admitting: Family Medicine

## 2017-01-26 ENCOUNTER — Other Ambulatory Visit: Payer: Self-pay | Admitting: Family Medicine

## 2017-01-30 DIAGNOSIS — F900 Attention-deficit hyperactivity disorder, predominantly inattentive type: Secondary | ICD-10-CM | POA: Diagnosis not present

## 2017-01-30 DIAGNOSIS — F3341 Major depressive disorder, recurrent, in partial remission: Secondary | ICD-10-CM | POA: Diagnosis not present

## 2017-01-30 DIAGNOSIS — F4011 Social phobia, generalized: Secondary | ICD-10-CM | POA: Diagnosis not present

## 2017-02-03 ENCOUNTER — Other Ambulatory Visit: Payer: BLUE CROSS/BLUE SHIELD

## 2017-02-07 ENCOUNTER — Encounter: Payer: BLUE CROSS/BLUE SHIELD | Admitting: Family Medicine

## 2017-02-13 ENCOUNTER — Other Ambulatory Visit: Payer: Self-pay | Admitting: Family Medicine

## 2017-02-13 NOTE — Telephone Encounter (Signed)
Asked pharm to advise pt to sched appt/pt cancelled via MyChart not seen in over a year/thx dmf

## 2017-02-21 ENCOUNTER — Other Ambulatory Visit: Payer: Self-pay | Admitting: Family Medicine

## 2017-02-21 ENCOUNTER — Encounter: Payer: Self-pay | Admitting: Family Medicine

## 2017-02-21 DIAGNOSIS — E119 Type 2 diabetes mellitus without complications: Secondary | ICD-10-CM

## 2017-02-21 DIAGNOSIS — Z01419 Encounter for gynecological examination (general) (routine) without abnormal findings: Secondary | ICD-10-CM

## 2017-02-22 ENCOUNTER — Encounter: Payer: Self-pay | Admitting: Family Medicine

## 2017-02-22 ENCOUNTER — Telehealth: Payer: Self-pay

## 2017-02-22 ENCOUNTER — Ambulatory Visit (INDEPENDENT_AMBULATORY_CARE_PROVIDER_SITE_OTHER): Payer: BLUE CROSS/BLUE SHIELD | Admitting: Family Medicine

## 2017-02-22 ENCOUNTER — Other Ambulatory Visit (INDEPENDENT_AMBULATORY_CARE_PROVIDER_SITE_OTHER): Payer: BLUE CROSS/BLUE SHIELD

## 2017-02-22 VITALS — BP 156/88 | HR 88 | Temp 98.5°F | Ht 63.0 in | Wt 234.8 lb

## 2017-02-22 DIAGNOSIS — E119 Type 2 diabetes mellitus without complications: Secondary | ICD-10-CM

## 2017-02-22 DIAGNOSIS — Z01419 Encounter for gynecological examination (general) (routine) without abnormal findings: Secondary | ICD-10-CM | POA: Diagnosis not present

## 2017-02-22 DIAGNOSIS — E785 Hyperlipidemia, unspecified: Secondary | ICD-10-CM

## 2017-02-22 LAB — CBC WITH DIFFERENTIAL/PLATELET
BASOS PCT: 1 % (ref 0.0–3.0)
Basophils Absolute: 0.1 10*3/uL (ref 0.0–0.1)
EOS ABS: 1.1 10*3/uL — AB (ref 0.0–0.7)
EOS PCT: 13.8 % — AB (ref 0.0–5.0)
HEMATOCRIT: 41.9 % (ref 36.0–46.0)
Hemoglobin: 14.1 g/dL (ref 12.0–15.0)
Lymphocytes Relative: 33.5 % (ref 12.0–46.0)
Lymphs Abs: 2.7 10*3/uL (ref 0.7–4.0)
MCHC: 33.6 g/dL (ref 30.0–36.0)
MCV: 89.7 fl (ref 78.0–100.0)
MONO ABS: 0.4 10*3/uL (ref 0.1–1.0)
Monocytes Relative: 5.1 % (ref 3.0–12.0)
Neutro Abs: 3.8 10*3/uL (ref 1.4–7.7)
Neutrophils Relative %: 46.6 % (ref 43.0–77.0)
Platelets: 279 10*3/uL (ref 150.0–400.0)
RBC: 4.67 Mil/uL (ref 3.87–5.11)
RDW: 12.9 % (ref 11.5–15.5)
WBC: 8.1 10*3/uL (ref 4.0–10.5)

## 2017-02-22 LAB — HEMOGLOBIN A1C: Hgb A1c MFr Bld: 7.6 % — ABNORMAL HIGH (ref 4.6–6.5)

## 2017-02-22 LAB — LIPID PANEL
CHOLESTEROL: 185 mg/dL (ref 0–200)
HDL: 47.7 mg/dL (ref >39.00)
LDL CALC: 102 mg/dL — AB (ref 0–99)
NonHDL: 137.25
TRIGLYCERIDES: 175 mg/dL — AB (ref 0.0–149.0)
Total CHOL/HDL Ratio: 4
VLDL: 35 mg/dL (ref 0.0–40.0)

## 2017-02-22 LAB — COMPREHENSIVE METABOLIC PANEL
ALK PHOS: 46 U/L (ref 39–117)
ALT: 43 U/L — ABNORMAL HIGH (ref 0–35)
AST: 26 U/L (ref 0–37)
Albumin: 3.9 g/dL (ref 3.5–5.2)
BUN: 14 mg/dL (ref 6–23)
CALCIUM: 8.8 mg/dL (ref 8.4–10.5)
CHLORIDE: 100 meq/L (ref 96–112)
CO2: 28 mEq/L (ref 19–32)
CREATININE: 0.94 mg/dL (ref 0.40–1.20)
GFR: 67.74 mL/min (ref >60.00)
Glucose, Bld: 205 mg/dL — ABNORMAL HIGH (ref 70–99)
POTASSIUM: 4.1 meq/L (ref 3.5–5.1)
SODIUM: 136 meq/L (ref 135–145)
TOTAL PROTEIN: 6.7 g/dL (ref 6.0–8.3)
Total Bilirubin: 0.4 mg/dL (ref 0.2–1.2)

## 2017-02-22 LAB — TSH: TSH: 4.42 u[IU]/mL (ref 0.35–4.50)

## 2017-02-22 MED ORDER — METFORMIN HCL 500 MG PO TABS: ORAL_TABLET | ORAL | 0 refills | Status: DC

## 2017-02-22 NOTE — Progress Notes (Signed)
Subjective:   Patient ID: Samantha Moses, female    DOB: [birth year], 46 y.o.   MRN: [id number]  DETTA Moses is a pleasant 46 y.o. year old female who presents to clinic today with Diabetes (Patient is here today for a DM F/U.  She had labs drawn this am.  She took her last Metformin on Monday morning.  While she was on the Metformin her numbers were running high. )  on 02/22/2017  HPI:  Appointment cancelled.  Lab Results  Component Value Date   HGBA1C 6.5 01/19/2016   Lab Results  Component Value Date   CHOL 197 01/19/2016   HDL 47.90 01/19/2016   LDLCALC 117 (H) 01/19/2016   LDLDIRECT 115.6 08/22/2012   TRIG 160.0 (H) 01/19/2016   CHOLHDL 4 01/19/2016     Current Outpatient Medications on File Prior to Visit  Medication Sig Dispense Refill  . albuterol (PROVENTIL HFA;VENTOLIN HFA) 108 (90 BASE) MCG/ACT inhaler Inhale 2 puffs into the lungs every 4 (four) hours as needed for wheezing.    Marland Kitchen amphetamine-dextroamphetamine (ADDERALL XR) 20 MG 24 hr capsule Take 20 mg by mouth every morning.    Marland Kitchen amphetamine-dextroamphetamine (ADDERALL) 10 MG tablet Take 10 mg by mouth daily with breakfast.    . buPROPion (WELLBUTRIN SR) 150 MG 12 hr tablet Take 150 mg by mouth 2 (two) times daily.    Marland Kitchen glucose blood (TRUE METRIX BLOOD GLUCOSE Moses) Moses strip Dx E11.9 100 each 2  . levocetirizine (XYZAL) 5 MG tablet TAKE 1 TABLET(5 MG) BY MOUTH EVERY EVENING 30 tablet 0  . lisinopril (PRINIVIL,ZESTRIL) 5 MG tablet TAKE 1 TABLET(5 MG) BY MOUTH DAILY 90 tablet 0  . montelukast (SINGULAIR) 10 MG tablet TAKE 1 TABLET BY MOUTH EVERY NIGHT AT BEDTIME 90 tablet 0  . Multiple Vitamin (MULTIVITAMIN) tablet Take 1 tablet by mouth daily.    . Omega-3 Fatty Acids (FISH OIL TRIPLE STRENGTH) 1400 MG CAPS Take by mouth 2 (two) times daily.    . pravastatin (PRAVACHOL) 40 MG tablet TAKE 1 TABLET(40 MG) BY MOUTH DAILY 90 tablet 1  . TRINESSA, 28, 0.18/0.215/0.25 MG-35 MCG tablet TAKE 1 TABLET BY MOUTH DAILY 84  tablet 1  . polyethylene glycol powder (GLYCOLAX/MIRALAX) powder Take 255 g by mouth once. (Patient not taking: Reported on 02/22/2017) 850 g 0   No current facility-administered medications on file prior to visit.     Allergies  Allergen Reactions  . Sulfa Antibiotics Anaphylaxis  . Doxycycline     rash  . Cefzil [Cefprozil] Rash    Past Medical History:  Diagnosis Date  . ADD (attention deficit disorder)   . Depression   . Diabetes mellitus without complication ([place])   . History of seizures   . Hyperlipidemia   . Hypertension     No past surgical history on file.  Family History  Problem Relation Age of Onset  . Hyperlipidemia Mother   . Hypertension Mother   . Hyperlipidemia Father   . Hypertension Father   . Diabetes Father   . Cancer Maternal Aunt 40       breast CA    Social History   Socioeconomic History  . Marital status: Single    Spouse name: Not on file  . Number of children: Not on file  . Years of education: Not on file  . Highest education level: Not on file  Social Needs  . Financial resource strain: Not on file  . Food insecurity - worry: Not  on file  . Food insecurity - inability: Not on file  . Transportation needs - medical: Not on file  . Transportation needs - non-medical: Not on file  Occupational History  . Not on file  Tobacco Use  . Smoking status: Never Smoker  . Smokeless tobacco: Never Used  Substance and Sexual Activity  . Alcohol use: Yes    Alcohol/week: 0.0 oz    Comment: rarely  . Drug use: No  . Sexual activity: Not on file  Other Topics Concern  . Not on file  Social History Narrative  . Not on file   The PMH, PSH, Social History, Family History, Medications, and allergies have been reviewed in [facility], and have been updated if relevant.   Review of Systems       Objective:    BP (!) 156/88 (BP Location: Left Arm, Patient Position: Sitting, Cuff Size: Normal)   Pulse 88   Temp 98.5 F (36.9 C) (Oral)   Ht  5\' 3"  (1.6 m)   Wt 234 lb 12.8 oz (106.5 kg)   LMP 02/08/2017   SpO2 95%   BMI 41.59 kg/m    Physical Exam         Assessment & Plan:   Diabetes mellitus without complication (HCC)  Hyperlipidemia, unspecified hyperlipidemia type No Follow-up on file.

## 2017-02-22 NOTE — Telephone Encounter (Signed)
eRx refill sent. 

## 2017-02-22 NOTE — Telephone Encounter (Signed)
Pt has been without metformin for 2 days. Just wanted Dr Deborra Medina to know since she had blood work today.

## 2017-02-23 ENCOUNTER — Other Ambulatory Visit: Payer: Self-pay | Admitting: Family Medicine

## 2017-03-05 ENCOUNTER — Other Ambulatory Visit: Payer: Self-pay | Admitting: Family Medicine

## 2017-03-13 ENCOUNTER — Other Ambulatory Visit: Payer: Self-pay | Admitting: Family Medicine

## 2017-04-03 ENCOUNTER — Other Ambulatory Visit: Payer: Self-pay | Admitting: Family Medicine

## 2017-04-11 ENCOUNTER — Other Ambulatory Visit: Payer: Self-pay | Admitting: Family Medicine

## 2017-04-25 ENCOUNTER — Other Ambulatory Visit (HOSPITAL_COMMUNITY)
Admission: RE | Admit: 2017-04-25 | Discharge: 2017-04-25 | Disposition: A | Discharge status: Home / Self Care | Payer: 59 | Source: Ambulatory Visit | Attending: Family Medicine | Admitting: Family Medicine

## 2017-04-25 ENCOUNTER — Encounter: Payer: Self-pay | Admitting: Family Medicine

## 2017-04-25 ENCOUNTER — Ambulatory Visit (INDEPENDENT_AMBULATORY_CARE_PROVIDER_SITE_OTHER): Payer: 59 | Admitting: Family Medicine

## 2017-04-25 VITALS — BP 164/104 | HR 87 | Temp 97.6°F | Ht 63.5 in | Wt 228.4 lb

## 2017-04-25 DIAGNOSIS — E785 Hyperlipidemia, unspecified: Secondary | ICD-10-CM

## 2017-04-25 DIAGNOSIS — E119 Type 2 diabetes mellitus without complications: Secondary | ICD-10-CM | POA: Diagnosis not present

## 2017-04-25 DIAGNOSIS — F988 Other specified behavioral and emotional disorders with onset usually occurring in childhood and adolescence: Secondary | ICD-10-CM

## 2017-04-25 DIAGNOSIS — I10 Essential (primary) hypertension: Secondary | ICD-10-CM | POA: Diagnosis not present

## 2017-04-25 DIAGNOSIS — Z01419 Encounter for gynecological examination (general) (routine) without abnormal findings: Secondary | ICD-10-CM | POA: Diagnosis not present

## 2017-04-25 LAB — CBC WITH DIFFERENTIAL/PLATELET
BASOS ABS: 0.1 10*3/uL (ref 0.0–0.1)
Basophils Relative: 1 % (ref 0.0–3.0)
EOS ABS: 0.8 10*3/uL — AB (ref 0.0–0.7)
Eosinophils Relative: 9 % — ABNORMAL HIGH (ref 0.0–5.0)
HCT: 44.5 % (ref 36.0–46.0)
Hemoglobin: 14.8 g/dL (ref 12.0–15.0)
LYMPHS ABS: 2.9 10*3/uL (ref 0.7–4.0)
Lymphocytes Relative: 34.2 % (ref 12.0–46.0)
MCHC: 33.3 g/dL (ref 30.0–36.0)
MCV: 90.1 fl (ref 78.0–100.0)
Monocytes Absolute: 0.5 10*3/uL (ref 0.1–1.0)
Monocytes Relative: 5.6 % (ref 3.0–12.0)
NEUTROS ABS: 4.2 10*3/uL (ref 1.4–7.7)
NEUTROS PCT: 50.2 % (ref 43.0–77.0)
Platelets: 263 10*3/uL (ref 150.0–400.0)
RBC: 4.94 Mil/uL (ref 3.87–5.11)
RDW: 13 % (ref 11.5–15.5)
WBC: 8.4 10*3/uL (ref 4.0–10.5)

## 2017-04-25 LAB — COMPREHENSIVE METABOLIC PANEL
ALT: 46 U/L — AB (ref 0–35)
AST: 33 U/L (ref 0–37)
Albumin: 4.1 g/dL (ref 3.5–5.2)
Alkaline Phosphatase: 52 U/L (ref 39–117)
BILIRUBIN TOTAL: 0.5 mg/dL (ref 0.2–1.2)
BUN: 15 mg/dL (ref 6–23)
CO2: 22 mEq/L (ref 19–32)
CREATININE: 0.8 mg/dL (ref 0.40–1.20)
Calcium: 9.1 mg/dL (ref 8.4–10.5)
Chloride: 98 mEq/L (ref 96–112)
GFR: 81.54 mL/min (ref >60.00)
GLUCOSE: 193 mg/dL — AB (ref 70–99)
Potassium: 4.1 mEq/L (ref 3.5–5.1)
Sodium: 133 mEq/L — ABNORMAL LOW (ref 135–145)
Total Protein: 6.3 g/dL (ref 6.0–8.3)

## 2017-04-25 LAB — LIPID PANEL
Cholesterol: 193 mg/dL (ref 0–200)
HDL: 43.2 mg/dL (ref >39.00)
NONHDL: 149.72
Total CHOL/HDL Ratio: 4
Triglycerides: 238 mg/dL — ABNORMAL HIGH (ref 0.0–149.0)
VLDL: 47.6 mg/dL — ABNORMAL HIGH (ref 0.0–40.0)

## 2017-04-25 LAB — TSH: TSH: 2.97 u[IU]/mL (ref 0.35–4.50)

## 2017-04-25 LAB — LDL CHOLESTEROL, DIRECT: Direct LDL: 117 mg/dL

## 2017-04-25 LAB — HEMOGLOBIN A1C: HEMOGLOBIN A1C: 8.5 % — AB (ref 4.6–6.5)

## 2017-04-25 MED ORDER — LOSARTAN POTASSIUM 25 MG PO TABS: 25.0000 mg | ORAL_TABLET | Freq: Every day | ORAL | 1 refills | Status: DC

## 2017-04-25 NOTE — Assessment & Plan Note (Signed)
Followed by psych

## 2017-04-25 NOTE — Patient Instructions (Addendum)
Great to see you. Happy New Year!  We will call you with your results or you can view them online.   Please stop taking your Lisinopril.  We are starting Cozaar 25 mg daily.  Please check your blood pressure at work in the next 1-2 weeks and update me.  Please call to schedule your mammogram.

## 2017-04-25 NOTE — Assessment & Plan Note (Signed)
Continue current rx. Check labs today. 

## 2017-04-25 NOTE — Assessment & Plan Note (Signed)
Reviewed preventive care protocols, scheduled due services, and updated immunizations Discussed nutrition, exercise, diet, and healthy lifestyle.  Discussed USPSTF recommendations of cervical cancer screening.  She is aware that interval of 3 years is recommended but pt would prefer to have pap smear done today.  

## 2017-04-25 NOTE — Assessment & Plan Note (Addendum)
Elevated today. On ACEI for renal protection as well. D/c lisinopril- add to allergy list. Start cozaar 25 mg daily. She will check BP at work and update me.

## 2017-04-25 NOTE — Progress Notes (Signed)
Subjective:    Patient ID: Samantha Moses, female    DOB: Feb 13, 1971, 47 y.o.   MRN: [id number]  HPI  Very pleasant 47 yo female here for CPX, follow up of chronic medical conditions.  Health Maintenance  Topic Date Due  . HIV Screening  10/22/1984  . INFLUENZA VACCINE  07/18/2017 (Originally 11/16/2016)  . HEMOGLOBIN A1C  08/22/2017  . PAP SMEAR  09/10/2017  . OPHTHALMOLOGY EXAM  10/23/2017  . FOOT EXAM  04/25/2018  . PNEUMOCOCCAL POLYSACCHARIDE VACCINE (2) 05/14/2019  . TETANUS/TDAP  11/02/2022    Last pap smear 09/11/14 (done by me).  No h/o abnormal pap smears. Mammogram 02/15/16  On OCPs for menorrhagia.  No family h/o blood clots or cancer other than an aunt with breast CA (paternal) and cousin. Non smoker.  Wt Readings from Last 3 Encounters:  04/25/17 228 lb 6.4 oz (103.6 kg)  02/22/17 234 lb 12.8 oz (106.5 kg)  01/28/16 226 lb 8 oz (102.7 kg)    DM- diagnosed around the year 2000.  On Metformin 1000 mg twice daily. Pneumovax received at work in 04/2014.  Lab Results  Component Value Date   HGBA1C 7.6 (H) 02/22/2017   BP is elevated.  She is  taking Lisinopril.  She feels it is causing a cough.  HLD- on Pravastatin 40 mg daily.  Denies myalgias. Lab Results  Component Value Date   CHOL 185 02/22/2017   HDL 47.70 02/22/2017   LDLCALC 102 (H) 02/22/2017   LDLDIRECT 115.6 08/22/2012   TRIG 175.0 (H) 02/22/2017   CHOLHDL 4 02/22/2017     HTN-  On Lisinopril 10 mg daily.  Denies any HA, blurred vision, CP or SOB. Lab Results  Component Value Date   CREATININE 0.94 02/22/2017     Depression- sees Dr. Nicolasa Ducking.  Has been stable on Wellbutrin.  ADD- also followed by Dr. Nicolasa Ducking.  On Adderall.   Lab Results  Component Value Date   ALT 43 (H) 02/22/2017   AST 26 02/22/2017   ALKPHOS 46 02/22/2017   BILITOT 0.4 02/22/2017    Patient Active Problem List   Diagnosis Date Noted  . Well woman exam with routine gynecological exam 04/25/2017  . Allergic  rhinitis 08/22/2012  . Menorrhagia 08/22/2012  . Hyperlipidemia   . Diabetes mellitus without complication ([place])   . Hypertension   . Depression   . ADD (attention deficit disorder)    Past Medical History:  Diagnosis Date  . ADD (attention deficit disorder)   . Depression   . Diabetes mellitus without complication ([place])   . History of seizures   . Hyperlipidemia   . Hypertension    No past surgical history on file. Social History   Tobacco Use  . Smoking status: Never Smoker  . Smokeless tobacco: Never Used  Substance Use Topics  . Alcohol use: Yes    Alcohol/week: 0.0 oz    Comment: rarely  . Drug use: No   Family History  Problem Relation Age of Onset  . Hyperlipidemia Mother   . Hypertension Mother   . Hyperlipidemia Father   . Hypertension Father   . Diabetes Father   . Cancer Maternal Aunt 40       breast CA   Allergies  Allergen Reactions  . Sulfa Antibiotics Anaphylaxis  . Doxycycline     rash  . Cefzil [Cefprozil] Rash   Current Outpatient Medications on File Prior to Visit  Medication Sig Dispense Refill  . albuterol (PROVENTIL HFA;VENTOLIN HFA)  108 (90 BASE) MCG/ACT inhaler Inhale 2 puffs into the lungs every 4 (four) hours as needed for wheezing.    Marland Kitchen amphetamine-dextroamphetamine (ADDERALL XR) 20 MG 24 hr capsule Take 20 mg by mouth every morning.    Marland Kitchen amphetamine-dextroamphetamine (ADDERALL) 10 MG tablet Take 10 mg by mouth daily with breakfast.    . buPROPion (WELLBUTRIN SR) 150 MG 12 hr tablet Take 150 mg by mouth 2 (two) times daily.    Marland Kitchen glucose blood (TRUE METRIX BLOOD GLUCOSE TEST) test strip Dx E11.9 100 each 2  . levocetirizine (XYZAL) 5 MG tablet TAKE 1 TABLET(5 MG) BY MOUTH EVERY EVENING 30 tablet 1  . lisinopril (PRINIVIL,ZESTRIL) 5 MG tablet TAKE 1 TABLET(5 MG) BY MOUTH DAILY 90 tablet 0  . metFORMIN (GLUCOPHAGE) 500 MG tablet TAKE 1 TABLET BY MOUTH TWICE DAILY WITH A MEAL 180 tablet 0  . montelukast (SINGULAIR) 10 MG tablet TAKE 1  TABLET(10 MG) BY MOUTH AT BEDTIME 90 tablet 3  . Multiple Vitamin (MULTIVITAMIN) tablet Take 1 tablet by mouth daily.    . Omega-3 Fatty Acids (FISH OIL TRIPLE STRENGTH) 1400 MG CAPS Take by mouth 2 (two) times daily.    . polyethylene glycol powder (GLYCOLAX/MIRALAX) powder Take 255 g by mouth once. 850 g 0  . pravastatin (PRAVACHOL) 40 MG tablet TAKE 1 TABLET(40 MG) BY MOUTH DAILY 90 tablet 3  . TRINESSA, 28, 0.18/0.215/0.25 MG-35 MCG tablet TAKE 1 TABLET BY MOUTH DAILY 84 tablet 1   No current facility-administered medications on file prior to visit.    The PMH, PSH, Social History, Family History, Medications, and allergies have been reviewed in [facility], and have been updated if relevant.   Review of Systems  Constitutional: Negative.   HENT: Negative.  Negative for rhinorrhea.   Respiratory: Negative for cough and wheezing.   Cardiovascular: Negative.   Gastrointestinal: Negative for anal bleeding, blood in stool, constipation, nausea and rectal pain.  Endocrine: Negative.   Genitourinary: Negative.   Musculoskeletal: Negative.   Skin: Negative.   Allergic/Immunologic: Negative.   Neurological: Negative.   Hematological: Negative.   Psychiatric/Behavioral: Negative.   All other systems reviewed and are negative.     Objective:   Physical Exam BP (!) 164/104 (BP Location: Right Arm, Patient Position: Sitting, Cuff Size: Large)   Pulse 87   Temp 97.6 F (36.4 C) (Oral)   Ht 5' 3.5" (1.613 m)   Wt 228 lb 6.4 oz (103.6 kg)   LMP 04/11/2017   SpO2 97%   BMI 39.82 kg/m   BP Readings from Last 3 Encounters:  04/25/17 (!) 164/104  02/22/17 (!) 156/88  01/28/16 120/78     General:  Well-developed,well-nourished,in no acute distress; alert,appropriate and cooperative throughout examination Head:  normocephalic and atraumatic.   Eyes:  vision grossly intact, PERRL Ears:  R ear normal and L ear normal externally, TMs clear bilaterally Nose:  no external deformity.   Mouth:   good dentition.   Neck:  No deformities, masses, or tenderness noted. Breasts:  No mass, nodules, thickening, tenderness, bulging, retraction, inflamation, nipple discharge or skin changes noted.   Lungs:  Normal respiratory effort, chest expands symmetrically. Lungs are clear to auscultation, no crackles or wheezes. Heart:  Normal rate and regular rhythm. S1 and S2 normal without gallop, murmur, click, rub or other extra sounds. Abdomen:  Bowel sounds positive,abdomen soft and non-tender without masses, organomegaly or hernias noted. Rectal:  no external abnormalities.   Genitalia:  Pelvic Exam:  External: normal female genitalia without lesions or masses        Vagina: normal without lesions or masses        Cervix: normal without lesions or masses        Adnexa: normal bimanual exam without masses or fullness        Uterus: normal by palpation        Pap smear: performed Msk:  No deformity or scoliosis noted of thoracic or lumbar spine.   Extremities:  No clubbing, cyanosis, edema, or deformity noted with normal full range of motion of all joints.   Neurologic:  alert & oriented X3 and gait normal.   Skin:  Intact without suspicious lesions or rashes Cervical Nodes:  No lymphadenopathy noted Axillary Nodes:  No palpable lymphadenopathy Psych:  Cognition and judgment appear intact. Alert and cooperative with normal attention span and concentration. No apparent delusions, illusions, hallucinations       Assessment & Plan:

## 2017-04-25 NOTE — Assessment & Plan Note (Signed)
Continue statin. Check lipid panel today. 

## 2017-04-27 LAB — CYTOLOGY - PAP
ADEQUACY: ABSENT
BACTERIAL VAGINITIS: NEGATIVE
Candida vaginitis: NEGATIVE
DIAGNOSIS: NEGATIVE
HPV (WINDOPATH): NOT DETECTED

## 2017-04-28 ENCOUNTER — Encounter: Payer: Self-pay | Admitting: Family Medicine

## 2017-04-28 ENCOUNTER — Telehealth: Payer: Self-pay

## 2017-04-28 NOTE — Telephone Encounter (Signed)
-----   Message from Lucille Passy, MD sent at 04/28/2017 12:08 PM EST ----- Please call pt- overall, labs look good and pap smear normal. a1c however is very high- how is she currently taking her Metformin?

## 2017-04-28 NOTE — Telephone Encounter (Signed)
LMOVM stating that overall labs look good as well as PAP but A1C is very high as she has seen via her MyChart/Asked for her to send a MyChart message advising if she is taking her Metformin/if not then she would just need to resume therapy otherwise if she is taking it then TA would have to make a change or increase it/thx dmf

## 2017-04-28 NOTE — Progress Notes (Signed)
LMOVM stating that overall labs look good as well as PAP but A1C is very high as she has seen via her MyChart/Asked for her to send a MyChart message advising if she is taking her Metformin/if not then she would just need to resume therapy otherwise if she is taking it then TA would have to make a change or increase it/thx dmf

## 2017-05-02 MED ORDER — METFORMIN HCL ER 500 MG PO TB24: 500.0000 mg | ORAL_TABLET | Freq: Every day | ORAL | 3 refills | Status: DC

## 2017-05-02 NOTE — Telephone Encounter (Signed)
Thank you.  I sent in the Metformin ER 500 mg daily dosage, we will likely have to increase it but I like to start out low when we change to an extended release formulation.  Thanks!

## 2017-05-14 ENCOUNTER — Encounter: Payer: Self-pay | Admitting: Family Medicine

## 2017-05-31 ENCOUNTER — Other Ambulatory Visit: Payer: Self-pay | Admitting: Family Medicine

## 2017-05-31 DIAGNOSIS — Z1231 Encounter for screening mammogram for malignant neoplasm of breast: Secondary | ICD-10-CM

## 2017-06-04 ENCOUNTER — Encounter: Payer: Self-pay | Admitting: Family Medicine

## 2017-06-05 ENCOUNTER — Other Ambulatory Visit: Payer: Self-pay | Admitting: Family Medicine

## 2017-06-05 DIAGNOSIS — Z01419 Encounter for gynecological examination (general) (routine) without abnormal findings: Secondary | ICD-10-CM

## 2017-06-05 MED ORDER — LOSARTAN POTASSIUM 100 MG PO TABS: 100.0000 mg | ORAL_TABLET | Freq: Every day | ORAL | 3 refills | Status: DC

## 2017-06-15 ENCOUNTER — Ambulatory Visit
Admission: RE | Admit: 2017-06-15 | Discharge: 2017-06-15 | Disposition: A | Discharge status: Home / Self Care | Payer: 59 | Source: Ambulatory Visit | Attending: Family Medicine | Admitting: Family Medicine

## 2017-06-15 DIAGNOSIS — Z1231 Encounter for screening mammogram for malignant neoplasm of breast: Secondary | ICD-10-CM | POA: Diagnosis present

## 2017-06-19 ENCOUNTER — Other Ambulatory Visit: Payer: Self-pay | Admitting: Family Medicine

## 2017-08-30 ENCOUNTER — Other Ambulatory Visit: Payer: Self-pay | Admitting: Family Medicine

## 2017-09-01 ENCOUNTER — Encounter: Payer: Self-pay | Admitting: Family Medicine

## 2017-09-24 ENCOUNTER — Other Ambulatory Visit: Payer: Self-pay | Admitting: Family Medicine

## 2017-09-25 ENCOUNTER — Other Ambulatory Visit: Payer: Self-pay | Admitting: Family Medicine

## 2017-10-01 ENCOUNTER — Other Ambulatory Visit: Payer: Self-pay | Admitting: Family Medicine

## 2017-10-01 DIAGNOSIS — Z01419 Encounter for gynecological examination (general) (routine) without abnormal findings: Secondary | ICD-10-CM

## 2017-10-04 ENCOUNTER — Other Ambulatory Visit: Payer: Self-pay | Admitting: Family Medicine

## 2017-10-04 ENCOUNTER — Telehealth: Payer: Self-pay

## 2017-10-04 MED ORDER — METFORMIN HCL ER 500 MG PO TB24: ORAL_TABLET | ORAL | 0 refills | Status: DC

## 2017-10-04 NOTE — Telephone Encounter (Signed)
Yes I will refill it now but she does need to be seen to repeat her a1c.

## 2017-10-04 NOTE — Telephone Encounter (Signed)
Left a vm for the pt to call back, pt needs to schedule follow up with Deborra Medina before this med runs out.

## 2017-10-04 NOTE — Telephone Encounter (Signed)
Dr. Deborra Medina please advise, last ov and A1C was 8.5 04/25/17. Do you want to see pt before refill this med? In e-mail stating you might have to adjust dosage

## 2017-10-04 NOTE — Telephone Encounter (Signed)
Rx sent in for 90 days.   Copied from [place] [id number]. Topic: General - Other >> Oct 04, 2017 12:48 PM Yvette Rack wrote: Reason for CRM: Apolonio Schneiders with [facility] states Rx metFORMIN (GLUCOPHAGE-XR) 500 MG 24 hr tablet is not being approved by pt insurance. Apolonio Schneiders states the insurance will only pay for 90 day supply. Rachel request 90 day supply Rx for metFORMIN  (GLUCOPHAGE-XR) 500 MG 24 hr tablet.

## 2017-10-05 NOTE — Telephone Encounter (Signed)
Left detail massage inform the pt of message below.

## 2017-11-07 ENCOUNTER — Encounter: Payer: Self-pay | Admitting: Family Medicine

## 2017-11-08 ENCOUNTER — Other Ambulatory Visit: Payer: Self-pay

## 2017-11-08 DIAGNOSIS — R5383 Other fatigue: Secondary | ICD-10-CM

## 2017-11-09 ENCOUNTER — Other Ambulatory Visit: Payer: Self-pay | Admitting: Family Medicine

## 2017-11-09 DIAGNOSIS — Z01419 Encounter for gynecological examination (general) (routine) without abnormal findings: Secondary | ICD-10-CM

## 2017-11-15 ENCOUNTER — Other Ambulatory Visit (INDEPENDENT_AMBULATORY_CARE_PROVIDER_SITE_OTHER): Payer: 59

## 2017-11-15 ENCOUNTER — Other Ambulatory Visit: Payer: Self-pay

## 2017-11-15 ENCOUNTER — Ambulatory Visit: Payer: 59 | Admitting: Family Medicine

## 2017-11-15 DIAGNOSIS — E119 Type 2 diabetes mellitus without complications: Secondary | ICD-10-CM

## 2017-11-15 LAB — POCT GLYCOSYLATED HEMOGLOBIN (HGB A1C): Hemoglobin A1C: 9.2 % — AB (ref 4.0–5.6)

## 2017-11-15 MED ORDER — METFORMIN HCL ER 500 MG PO TB24: ORAL_TABLET | ORAL | 0 refills | Status: DC

## 2017-11-15 NOTE — Progress Notes (Signed)
Pt taking Metformin ER 500mg  1qam/last A1C was 8.5 and was changed to the ER as she was forgetting pm dosage/she states that she is ok with increasing to 2qam if needed and that I can leave her a detailed message/thx dmf

## 2017-11-17 ENCOUNTER — Encounter: Payer: Self-pay | Admitting: Family Medicine

## 2017-11-21 ENCOUNTER — Ambulatory Visit: Payer: 59 | Admitting: Family Medicine

## 2017-12-06 ENCOUNTER — Encounter: Payer: Self-pay | Admitting: Family Medicine

## 2017-12-07 DIAGNOSIS — E119 Type 2 diabetes mellitus without complications: Secondary | ICD-10-CM | POA: Diagnosis not present

## 2017-12-12 ENCOUNTER — Other Ambulatory Visit: Payer: Self-pay | Admitting: Family Medicine

## 2017-12-12 DIAGNOSIS — Z01419 Encounter for gynecological examination (general) (routine) without abnormal findings: Secondary | ICD-10-CM

## 2017-12-21 ENCOUNTER — Ambulatory Visit (INDEPENDENT_AMBULATORY_CARE_PROVIDER_SITE_OTHER): Payer: 59 | Admitting: Family Medicine

## 2017-12-21 ENCOUNTER — Other Ambulatory Visit: Payer: Self-pay

## 2017-12-21 ENCOUNTER — Other Ambulatory Visit: Payer: Self-pay | Admitting: Family Medicine

## 2017-12-21 ENCOUNTER — Encounter: Payer: Self-pay | Admitting: Family Medicine

## 2017-12-21 ENCOUNTER — Telehealth: Payer: Self-pay

## 2017-12-21 VITALS — BP 130/80 | HR 81 | Temp 98.6°F | Ht 63.5 in | Wt 220.8 lb

## 2017-12-21 DIAGNOSIS — E119 Type 2 diabetes mellitus without complications: Secondary | ICD-10-CM | POA: Diagnosis not present

## 2017-12-21 DIAGNOSIS — F329 Major depressive disorder, single episode, unspecified: Secondary | ICD-10-CM | POA: Diagnosis not present

## 2017-12-21 DIAGNOSIS — F32A Depression, unspecified: Secondary | ICD-10-CM

## 2017-12-21 DIAGNOSIS — R5383 Other fatigue: Secondary | ICD-10-CM

## 2017-12-21 DIAGNOSIS — F988 Other specified behavioral and emotional disorders with onset usually occurring in childhood and adolescence: Secondary | ICD-10-CM

## 2017-12-21 MED ORDER — AMPHETAMINE-DEXTROAMPHETAMINE 10 MG PO TABS: 10.0000 mg | ORAL_TABLET | Freq: Every day | ORAL | 0 refills | Status: DC

## 2017-12-21 MED ORDER — BUPROPION HCL ER (XL) 150 MG PO TB24: 150.0000 mg | ORAL_TABLET | Freq: Every day | ORAL | 3 refills | Status: DC

## 2017-12-21 MED ORDER — AMPHETAMINE-DEXTROAMPHET ER 20 MG PO CP24: 20.0000 mg | ORAL_CAPSULE | ORAL | 0 refills | Status: DC

## 2017-12-21 MED ORDER — METFORMIN HCL ER (MOD) 1000 MG PO TB24: ORAL_TABLET | ORAL | 3 refills | Status: DC

## 2017-12-21 NOTE — Progress Notes (Signed)
Subjective:   Patient ID: Samantha Moses, female    DOB: 1970-11-22, 47 y.o.   MRN: [id number]  Samantha Moses is a pleasant 47 y.o. year old female who presents to clinic today with Diabetes (Patient is here today to F/u with DM.  Pt was changed to Metformin ER due to having a hard time remember her pm dose.  On 7.31.19 her A1C was 9.2 and Metormin ER was increased to 2qam.  She has not been keeping a check on her blood sugars but she says she feels better than she did before.  She got a new pair of glasses in May and thinks she had an eye exam and will have them fax that over.  Declines HIV lab draw. ) and Medication Issues (Patient had to change Psychologist as insurance changed.  She had to stop seeing the new one due to the cost being $300.00 every time.  She is now taking the Adderall XR 30mg  but it is too high of a dose and still wears off in the afternoons and would like to go back to takeing the Adderall XR 20mg  1qam and Adderall 10mg  1 at noon.  She also wants to discuss changing Wellbutrin SR 150mg  bid to XL 150 1qd.  )  on 12/21/2017  HPI:  Diabetes- Diagnosed in 2000.  Has been on Metformin 1000 mg twice daily for years. In January, her a1c was 37 so we changed her Metformin to 500 mg ER daily as she was having a hard time remembering to take her evening dose.   On 11/15/17, a1c was 9.2 so her Metformin was increased to 1000 mg ER( 2 tablets of 500 mg ER) daily. She does not check her FSBS regularly but she does feel as if her sugars are running better.  Lab Results  Component Value Date   HGBA1C 9.2 (A) 11/15/2017   Depression/ADD- was followed by Dr. Nicolasa Ducking who has been prescribing her Adderall XR 30 mg daily and Wellbutrin SR 150 mg twice daily. Her insurance has changed so she went to [employer] and she can not afford that co pay.  She now wants me to manage these rxs.  She feels Adderall XR 30 mg daily is too strong and ants to go back to taking Adderall XR 20 mg every morning and  10 mg at noon.  She would like to also change Wellbutrin SR 150 mg twice daily to Wellbutrin XL 150 mg daily.   Current Outpatient Medications on File Prior to Visit  Medication Sig Dispense Refill  . albuterol (PROVENTIL HFA;VENTOLIN HFA) 108 (90 BASE) MCG/ACT inhaler Inhale 2 puffs into the lungs every 4 (four) hours as needed for wheezing.    Marland Kitchen glucose blood (TRUE METRIX BLOOD GLUCOSE TEST) test strip Dx E11.9 100 each 2  . levocetirizine (XYZAL) 5 MG tablet TAKE 1 TABLET(5 MG) BY MOUTH EVERY EVENING 30 tablet 2  . losartan (COZAAR) 100 MG tablet TAKE 1 TABLET(100 MG) BY MOUTH DAILY 30 tablet 0  . montelukast (SINGULAIR) 10 MG tablet TAKE 1 TABLET(10 MG) BY MOUTH AT BEDTIME 90 tablet 3  . Multiple Vitamin (MULTIVITAMIN) tablet Take 1 tablet by mouth daily.    . Omega-3 Fatty Acids (FISH OIL TRIPLE STRENGTH) 1400 MG CAPS Take by mouth 2 (two) times daily.    . polyethylene glycol powder (GLYCOLAX/MIRALAX) powder Take 255 g by mouth once. 850 g 0  . pravastatin (PRAVACHOL) 40 MG tablet TAKE 1 TABLET(40 MG) BY MOUTH DAILY  90 tablet 3  . TRI-SPRINTEC 0.18/0.215/0.25 MG-35 MCG tablet TAKE 1 TABLET BY MOUTH DAILY 84 tablet 2   No current facility-administered medications on file prior to visit.     Allergies  Allergen Reactions  . Sulfa Antibiotics Anaphylaxis  . Doxycycline     rash  . Lisinopril     cough  . Cefzil [Cefprozil] Rash    Past Medical History:  Diagnosis Date  . ADD (attention deficit disorder)   . Depression   . Diabetes mellitus without complication ([place])   . History of seizures   . Hyperlipidemia   . Hypertension     No past surgical history on file.  Family History  Problem Relation Age of Onset  . Hyperlipidemia Mother   . Hypertension Mother   . Hyperlipidemia Father   . Hypertension Father   . Diabetes Father   . Cancer Maternal Aunt 40       breast CA  . Breast cancer Cousin   . Breast cancer Other   . Breast cancer Other     Social History     Socioeconomic History  . Marital status: Single    Spouse name: Not on file  . Number of children: Not on file  . Years of education: Not on file  . Highest education level: Not on file  Occupational History  . Not on file  Social Needs  . Financial resource strain: Not on file  . Food insecurity:    Worry: Not on file    Inability: Not on file  . Transportation needs:    Medical: Not on file    Non-medical: Not on file  Tobacco Use  . Smoking status: Never Smoker  . Smokeless tobacco: Never Used  Substance and Sexual Activity  . Alcohol use: Yes    Alcohol/week: 0.0 standard drinks    Comment: rarely  . Drug use: No  . Sexual activity: Not on file  Lifestyle  . Physical activity:    Days per week: Not on file    Minutes per session: Not on file  . Stress: Not on file  Relationships  . Social connections:    Talks on phone: Not on file    Gets together: Not on file    Attends religious service: Not on file    Active member of club or organization: Not on file    Attends meetings of clubs or organizations: Not on file    Relationship status: Not on file  . Intimate partner violence:    Fear of current or ex partner: Not on file    Emotionally abused: Not on file    Physically abused: Not on file    Forced sexual activity: Not on file  Other Topics Concern  . Not on file  Social History Narrative  . Not on file   The PMH, PSH, Social History, Family History, Medications, and allergies have been reviewed in [facility], and have been updated if relevant.   Review of Systems  Constitutional: Negative.   HENT: Negative.   Eyes: Negative.   Respiratory: Negative.   Cardiovascular: Negative.   Gastrointestinal: Negative.   Endocrine: Negative.   Genitourinary: Negative.   Musculoskeletal: Negative.   Allergic/Immunologic: Negative.   Neurological: Negative.   Hematological: Negative.   Psychiatric/Behavioral: The patient is nervous/anxious.   All other systems  reviewed and are negative.      Objective:    BP 130/80 (BP Location: Left Arm, Patient Position: Sitting, Cuff Size: Normal)  Pulse 81   Temp 98.6 F (37 C) (Oral)   Ht 5' 3.5" (1.613 m)   Wt 220 lb 12.8 oz (100.2 kg)   LMP 12/20/2017   SpO2 98%   BMI 38.50 kg/m    Physical Exam  Constitutional: She is oriented to person, place, and time. She appears well-developed and well-nourished. No distress.  HENT:  Head: Normocephalic and atraumatic.  Eyes: EOM are normal.  Neck: Normal range of motion.  Cardiovascular: Normal rate.  Pulmonary/Chest: Effort normal.  Musculoskeletal: Normal range of motion.  Neurological: She is alert and oriented to person, place, and time. No cranial nerve deficit.  Skin: Skin is warm and dry. She is not diaphoretic.  Psychiatric: She has a normal mood and affect. Her behavior is normal. Judgment and thought content normal.  Nursing note and vitals reviewed.         Assessment & Plan:   Diabetes mellitus without complication (HCC)  Depression, unspecified depression type  Attention deficit disorder, unspecified hyperactivity presence - Plan: Pain Mgmt, Profile 8 w/Conf, U  Fatigue, unspecified type - Plan: Lyme Ab/Western Blot Reflex, Lyme Ab/Western Blot Reflex, CANCELED: Lyme Disease Abs IgG, IgM, IFA, CSF No follow-ups on file.

## 2017-12-21 NOTE — Telephone Encounter (Signed)
rx resent to correct pharmacy 

## 2017-12-21 NOTE — Assessment & Plan Note (Signed)
She was managed by psychiatry but can no longer afford copay.  She would like for me to take over prescribing these rxs at dose that she was stable on for years prior to recent change by psychiatry. Reviewed pt in PMP- no red flags. Will have her sign a controlled substances contract today and obtain a UDS. Rx changed to her previous dose- Adderall 20 mg XL every morning along with adderall 10 mg every afternoon. Rxs printed and given to pt. The patient indicates understanding of these issues and agrees with the plan.

## 2017-12-21 NOTE — Patient Instructions (Signed)
Great to see you. Please come see me at end of October.

## 2017-12-21 NOTE — Assessment & Plan Note (Signed)
She would like to change her Wellbutrin rx to 150 mg XL daily rather than 150 mg twice daily for ease of use. Rx changed. Call or return to clinic prn if these symptoms worsen or fail to improve as anticipated. The patient indicates understanding of these issues and agrees with the plan.

## 2017-12-21 NOTE — Telephone Encounter (Signed)
Copied from [place]. Topic: General - Other >> Dec 21, 2017  3:15 PM Carolyn Stare wrote: These were sent to [place] instead of the pharmacy on file . Please resend to [facility]     amphetamine-dextroamphetamine (ADDERALL XR) 20 MG 24 hr capsule   amphetamine-dextroamphetamine (ADDERALL) 10 MG tablet

## 2017-12-21 NOTE — Assessment & Plan Note (Signed)
Recently increased Metformin to 1000 mg XL daily. Too soon to check a1c.  Not due until October.  Does not check FSBS but she does deny any symptoms of hypoglycemia. She will follow up with me in October, sooner if she does develop any symptoms of hypoglycemia.

## 2017-12-21 NOTE — Telephone Encounter (Signed)
Rx refills canceled at [facility]. Need to be sent to [facility] [place]

## 2017-12-22 LAB — LYME AB/WESTERN BLOT REFLEX

## 2017-12-22 MED ORDER — AMPHETAMINE-DEXTROAMPHET ER 20 MG PO CP24: 20.0000 mg | ORAL_CAPSULE | ORAL | 0 refills | Status: DC

## 2017-12-22 MED ORDER — AMPHETAMINE-DEXTROAMPHETAMINE 10 MG PO TABS: 10.0000 mg | ORAL_TABLET | Freq: Every day | ORAL | 0 refills | Status: DC

## 2017-12-22 NOTE — Addendum Note (Signed)
Addended by: Leana Gamer on: 12/22/2017 08:36 AM   Modules accepted: Orders

## 2017-12-23 LAB — PAIN MGMT, PROFILE 8 W/CONF, U
6 Acetylmorphine: NEGATIVE ng/mL (ref <10)
ALCOHOL METABOLITES: NEGATIVE ng/mL (ref <500)
Amphetamine: 2799 ng/mL — ABNORMAL HIGH (ref <250)
Amphetamines: POSITIVE ng/mL — AB (ref <500)
Benzodiazepines: NEGATIVE ng/mL (ref <100)
Buprenorphine, Urine: NEGATIVE ng/mL (ref <5)
CREATININE: 89.6 mg/dL
Cocaine Metabolite: NEGATIVE ng/mL (ref <150)
MDMA: NEGATIVE ng/mL (ref <500)
Marijuana Metabolite: NEGATIVE ng/mL (ref <20)
Methamphetamine: NEGATIVE ng/mL (ref <250)
Opiates: NEGATIVE ng/mL (ref <100)
Oxidant: NEGATIVE ug/mL (ref <200)
Oxycodone: NEGATIVE ng/mL (ref <100)
PH: 6.49 (ref 4.5–9.0)

## 2018-01-11 ENCOUNTER — Other Ambulatory Visit: Payer: Self-pay | Admitting: Family Medicine

## 2018-01-11 DIAGNOSIS — Z01419 Encounter for gynecological examination (general) (routine) without abnormal findings: Secondary | ICD-10-CM

## 2018-02-13 ENCOUNTER — Ambulatory Visit (INDEPENDENT_AMBULATORY_CARE_PROVIDER_SITE_OTHER): Payer: 59 | Admitting: Family Medicine

## 2018-02-13 ENCOUNTER — Encounter: Payer: Self-pay | Admitting: Family Medicine

## 2018-02-13 VITALS — BP 132/74 | HR 77 | Temp 98.4°F | Ht 63.5 in | Wt 220.0 lb

## 2018-02-13 DIAGNOSIS — F988 Other specified behavioral and emotional disorders with onset usually occurring in childhood and adolescence: Secondary | ICD-10-CM

## 2018-02-13 DIAGNOSIS — E119 Type 2 diabetes mellitus without complications: Secondary | ICD-10-CM

## 2018-02-13 DIAGNOSIS — F32A Depression, unspecified: Secondary | ICD-10-CM

## 2018-02-13 DIAGNOSIS — F329 Major depressive disorder, single episode, unspecified: Secondary | ICD-10-CM | POA: Diagnosis not present

## 2018-02-13 LAB — POCT GLYCOSYLATED HEMOGLOBIN (HGB A1C): Hemoglobin A1C: 8.1 % — AB (ref 4.0–5.6)

## 2018-02-13 MED ORDER — BUPROPION HCL ER (XL) 150 MG PO TB24: 150.0000 mg | ORAL_TABLET | Freq: Every day | ORAL | 3 refills | Status: DC

## 2018-02-13 NOTE — Assessment & Plan Note (Signed)
Improving but not quite at goal. >15 minutes spent in face to face time with patient, >50% spent in counselling or coordination of care.  We agreed to continue her current dose of Metformin since she admits to not following her diet and has been under a lot of stress.  Also, her eating patterns have not been regular and we are both concerned about adding another agent which could cause hypoglycemia. Follow up in 3 months. The patient indicates understanding of these issues and agrees with the plan.

## 2018-02-13 NOTE — Patient Instructions (Signed)
Good to see you. I am sorry things are so stressful.  Let's work on your diet and come see me in 3 months.

## 2018-02-13 NOTE — Progress Notes (Signed)
Subjective:   Patient ID: Samantha Moses, female    DOB: 12-31-1970, 47 y.o.   MRN: [id number]  Samantha Moses is a pleasant 47 y.o. year old female who presents to clinic today with Diabetes (doing well-she has not been checking her sugars)  on 02/13/2018  HPI:  Diabetes- last saw on 12/21/2017.  Note reviewed. Increased Metformin to 1000 mg XL daily in 11/2017. She does not check FSBS regularly. a1c was 9.2 in July (3 months ago).  She has been under a lot of stress- took a new job that she thought would be an improvement from her previous job but it has only been more stressful.  She is stress eating and eating late at night.  Wt Readings from Last 3 Encounters:  02/13/18 220 lb (99.8 kg)  12/21/17 220 lb 12.8 oz (100.2 kg)  04/25/17 228 lb 6.4 oz (103.6 kg)    Lab Results  Component Value Date   HGBA1C 8.1 (A) 02/13/2018   Lab Results  Component Value Date   CHOL 193 04/25/2017   HDL 43.20 04/25/2017   LDLCALC 102 (H) 02/22/2017   LDLDIRECT 117.0 04/25/2017   TRIG 238.0 (H) 04/25/2017   CHOLHDL 4 04/25/2017    Depression/ADD- feels Wellbutrin 150 mg XL daily is working well.  Stopped adderral because she was getting HA and trouble sleeping.  Current Outpatient Medications on File Prior to Visit  Medication Sig Dispense Refill  . albuterol (PROVENTIL HFA;VENTOLIN HFA) 108 (90 BASE) MCG/ACT inhaler Inhale 2 puffs into the lungs every 4 (four) hours as needed for wheezing.    Marland Kitchen buPROPion (WELLBUTRIN XL) 150 MG 24 hr tablet Take 1 tablet (150 mg total) by mouth daily. 30 tablet 3  . glucose blood (TRUE METRIX BLOOD GLUCOSE TEST) test strip Dx E11.9 100 each 2  . levocetirizine (XYZAL) 5 MG tablet TAKE 1 TABLET(5 MG) BY MOUTH EVERY EVENING 90 tablet 0  . losartan (COZAAR) 100 MG tablet TAKE 1 TABLET(100 MG) BY MOUTH DAILY 90 tablet 0  . metFORMIN (GLUMETZA) 1000 MG (MOD) 24 hr tablet TAKE 2 TABLET(500 MG) BY MOUTH DAILY WITH BREAKFAST 30 tablet 3  . montelukast (SINGULAIR) 10  MG tablet TAKE 1 TABLET(10 MG) BY MOUTH AT BEDTIME 90 tablet 3  . Multiple Vitamin (MULTIVITAMIN) tablet Take 1 tablet by mouth daily.    . Omega-3 Fatty Acids (FISH OIL TRIPLE STRENGTH) 1400 MG CAPS Take by mouth 2 (two) times daily.    . polyethylene glycol powder (GLYCOLAX/MIRALAX) powder Take 255 g by mouth once. 850 g 0  . pravastatin (PRAVACHOL) 40 MG tablet TAKE 1 TABLET(40 MG) BY MOUTH DAILY 90 tablet 3  . TRI-SPRINTEC 0.18/0.215/0.25 MG-35 MCG tablet TAKE 1 TABLET BY MOUTH DAILY 84 tablet 2  . amphetamine-dextroamphetamine (ADDERALL XR) 20 MG 24 hr capsule Take 1 capsule (20 mg total) by mouth every morning. (Patient not taking: Reported on 02/13/2018) 30 capsule 0  . amphetamine-dextroamphetamine (ADDERALL) 10 MG tablet Take 1 tablet (10 mg total) by mouth daily with breakfast. (Patient not taking: Reported on 02/13/2018) 30 tablet 0   No current facility-administered medications on file prior to visit.     Allergies  Allergen Reactions  . Sulfa Antibiotics Anaphylaxis  . Doxycycline     rash  . Lisinopril     cough  . Cefzil [Cefprozil] Rash    Past Medical History:  Diagnosis Date  . ADD (attention deficit disorder)   . Depression   . Diabetes mellitus without complication (  [place])   . History of seizures   . Hyperlipidemia   . Hypertension     No past surgical history on file.  Family History  Problem Relation Age of Onset  . Hyperlipidemia Mother   . Hypertension Mother   . Hyperlipidemia Father   . Hypertension Father   . Diabetes Father   . Cancer Maternal Aunt 40       breast CA  . Breast cancer Cousin   . Breast cancer Other   . Breast cancer Other     Social History   Socioeconomic History  . Marital status: Single    Spouse name: Not on file  . Number of children: Not on file  . Years of education: Not on file  . Highest education level: Not on file  Occupational History  . Not on file  Social Needs  . Financial resource strain: Not on file    . Food insecurity:    Worry: Not on file    Inability: Not on file  . Transportation needs:    Medical: Not on file    Non-medical: Not on file  Tobacco Use  . Smoking status: Never Smoker  . Smokeless tobacco: Never Used  Substance and Sexual Activity  . Alcohol use: Yes    Alcohol/week: 0.0 standard drinks    Comment: rarely  . Drug use: No  . Sexual activity: Not on file  Lifestyle  . Physical activity:    Days per week: Not on file    Minutes per session: Not on file  . Stress: Not on file  Relationships  . Social connections:    Talks on phone: Not on file    Gets together: Not on file    Attends religious service: Not on file    Active member of club or organization: Not on file    Attends meetings of clubs or organizations: Not on file    Relationship status: Not on file  . Intimate partner violence:    Fear of current or ex partner: Not on file    Emotionally abused: Not on file    Physically abused: Not on file    Forced sexual activity: Not on file  Other Topics Concern  . Not on file  Social History Narrative  . Not on file   The PMH, PSH, Social History, Family History, Medications, and allergies have been reviewed in [facility], and have been updated if relevant.     Review of Systems  Constitutional: Negative.   HENT: Negative.   Eyes: Negative.   Respiratory: Negative.   Cardiovascular: Negative.   Gastrointestinal: Negative.   Endocrine: Negative.   Genitourinary: Negative.   Musculoskeletal: Negative.   Skin: Negative.   Allergic/Immunologic: Negative.   Neurological: Negative.   Hematological: Negative.   Psychiatric/Behavioral: The patient is nervous/anxious.   All other systems reviewed and are negative.      Objective:    BP 132/74   Pulse 77   Temp 98.4 F (36.9 C) (Oral)   Ht 5' 3.5" (1.613 m)   Wt 220 lb (99.8 kg)   SpO2 98%   BMI 38.36 kg/m    Physical Exam  Constitutional: She is oriented to person, place, and time. She  appears well-developed and well-nourished. No distress.  HENT:  Head: Normocephalic and atraumatic.  Eyes: EOM are normal.  Neck: Normal range of motion.  Cardiovascular: Normal rate.  Pulmonary/Chest: Effort normal.  Musculoskeletal: Normal range of motion.  Neurological: She is alert  and oriented to person, place, and time. No cranial nerve deficit.  Skin: Skin is warm and dry. She is not diaphoretic.  Psychiatric: She has a normal mood and affect. Her behavior is normal. Judgment and thought content normal.  Nursing note and vitals reviewed.         Assessment & Plan:   Diabetes mellitus without complication ([place]) - Plan: POCT HgB A1C  Attention deficit disorder, unspecified hyperactivity presence  Depression, unspecified depression type Return in about 3 months (around 05/16/2018) for medication follow up.Marland Kitchen

## 2018-02-13 NOTE — Assessment & Plan Note (Signed)
She is pleased with current dose of Wellbutrin. eRx refill sent.

## 2018-02-21 ENCOUNTER — Other Ambulatory Visit: Payer: Self-pay | Admitting: Family Medicine

## 2018-03-06 ENCOUNTER — Other Ambulatory Visit: Payer: Self-pay | Admitting: Family Medicine

## 2018-03-07 ENCOUNTER — Other Ambulatory Visit: Payer: Self-pay

## 2018-03-07 ENCOUNTER — Encounter: Payer: Self-pay | Admitting: Family Medicine

## 2018-03-07 MED ORDER — METFORMIN HCL ER (MOD) 1000 MG PO TB24: ORAL_TABLET | ORAL | 1 refills | Status: DC

## 2018-03-16 ENCOUNTER — Other Ambulatory Visit: Payer: Self-pay | Admitting: Family Medicine

## 2018-03-23 ENCOUNTER — Encounter: Payer: Self-pay | Admitting: Family Medicine

## 2018-03-27 ENCOUNTER — Other Ambulatory Visit: Payer: Self-pay

## 2018-03-27 MED ORDER — METFORMIN HCL ER 500 MG PO TB24: ORAL_TABLET | ORAL | 1 refills | Status: DC

## 2018-03-27 NOTE — Progress Notes (Signed)
Resent as ER 500mg  2qam #180 with 1 refill ok per TA/thx dmf

## 2018-04-23 ENCOUNTER — Other Ambulatory Visit: Payer: Self-pay | Admitting: Family Medicine

## 2018-04-23 DIAGNOSIS — Z01419 Encounter for gynecological examination (general) (routine) without abnormal findings: Secondary | ICD-10-CM

## 2018-05-14 ENCOUNTER — Other Ambulatory Visit: Payer: Self-pay | Admitting: Family Medicine

## 2018-06-02 ENCOUNTER — Other Ambulatory Visit: Payer: Self-pay | Admitting: Family Medicine

## 2018-06-08 MED ORDER — METFORMIN HCL ER 500 MG PO TB24: 1000.0000 mg | ORAL_TABLET | Freq: Every day | ORAL | 1 refills | Status: DC

## 2018-06-08 NOTE — Progress Notes (Signed)
Subjective:   Patient ID: Samantha Moses, female    DOB: Sep 14, 1970, 48 y.o.   MRN: [id number]  Samantha Moses is a pleasant 48 y.o. year old female who presents to clinic today with Diabetes (Patient is here today for a DM check.  Last A1C was completed on 10.29.19 and was an 8.1.  She states that Metformin came through as 1qd instead of 2qd so she has been taking as 1qd since december.  BP checked twice.  She states that she took Pseudofed this am.)  on 06/11/2018  HPI:  Last saw patient on 02/13/18.  Note reviewed. At that OV, a1c had come down from 9.2 (10/2017) to 8.1. She had been under significant stress at work and while her a1c had improved, she admitted that she had not been following a diabetic diet. We therefore agreed to continue her previous dose of Metformin 1000 mg XL daily and to follow up with me in 3 months. Unfortunately she has only been taking 500 mg XL daily.  Lab Results  Component Value Date   HGBA1C 9.5 (A) 06/11/2018   HGBA1C 9.5 06/11/2018    HLD- taking pravachol 40 mg daily.  Lab Results  Component Value Date   CHOL 193 04/25/2017   HDL 43.20 04/25/2017   LDLCALC 102 (H) 02/22/2017   LDLDIRECT 117.0 04/25/2017   TRIG 238.0 (H) 04/25/2017   CHOLHDL 4 04/25/2017   Lab Results  Component Value Date   ALT 46 (H) 04/25/2017   AST 33 04/25/2017   ALKPHOS 52 04/25/2017   BILITOT 0.5 04/25/2017   On ARB for HTN (and renal protection)- Cozaar 100 mg daily.  Denies HA, blurred vision, SP or SOB. BP is very elevated today.  She is asymptomatic.  Just took a sudafed and has been under tremendous stress at work. BP Readings from Last 3 Encounters:  06/11/18 (!) 196/110  02/13/18 132/74  12/21/17 130/80    Lab Results  Component Value Date   CREATININE 0.80 04/25/2017     Anxiety and depression- under significant stress at work.  Her puppy was also killed last month.  Currently taking Wellbutrin XR 150 mg daily. Denies SI or HI.  Difficulty  sleeping. Depression screen [facility] 2/9 06/11/2018 12/21/2017 04/25/2017  Decreased Interest _0 Down, Depressed, Hopeless 1 0 1  PHQ - 2 Score _1 Altered sleeping _2 Tired, decreased energy _3 Change in appetite 3 2 0  Feeling bad or failure about yourself  2 0 1  Trouble concentrating 1 0 0  Moving slowly or fidgety/restless 0 0 1  Suicidal thoughts 0 0 0  PHQ-9 Score _4 Difficult doing work/chores Very difficult Not difficult at all Somewhat difficult   GAD 7 : Generalized Anxiety Score 06/11/2018 12/21/2017  Nervous, Anxious, on Edge 3 1  Control/stop worrying 2 0  Worry too much - different things 2 0  Trouble relaxing 1 0  Restless 1 0  Easily annoyed or irritable 3 0  Afraid - awful might happen 0 0  Total GAD 7 Score 12 1  Anxiety Difficulty Very difficult Not difficult at all       Current Outpatient Medications on File Prior to Visit  Medication Sig Dispense Refill  . albuterol (PROVENTIL HFA;VENTOLIN HFA) 108 (90 BASE) MCG/ACT inhaler Inhale 2 puffs into the lungs every 4 (four) hours as needed for wheezing.    Marland Kitchen buPROPion (WELLBUTRIN XL)  150 MG 24 hr tablet Take 1 tablet (150 mg total) by mouth daily. 90 tablet 3  . glucose blood (TRUE METRIX BLOOD GLUCOSE TEST) test strip Dx E11.9 100 each 2  . levocetirizine (XYZAL) 5 MG tablet TAKE 1 TABLET(5 MG) BY MOUTH EVERY EVENING 30 tablet 0  . losartan (COZAAR) 100 MG tablet TAKE 1 TABLET(100 MG) BY MOUTH DAILY 90 tablet 0  . montelukast (SINGULAIR) 10 MG tablet TAKE 1 TABLET(10 MG) BY MOUTH AT BEDTIME 90 tablet 0  . Multiple Vitamin (MULTIVITAMIN) tablet Take 1 tablet by mouth daily.    . Omega-3 Fatty Acids (FISH OIL TRIPLE STRENGTH) 1400 MG CAPS Take by mouth 2 (two) times daily.    . polyethylene glycol powder (GLYCOLAX/MIRALAX) powder Take 255 g by mouth once. 850 g 0  . pravastatin (PRAVACHOL) 40 MG tablet TAKE 1 TABLET(40 MG) BY MOUTH DAILY 90 tablet 0  . TRI-SPRINTEC 0.18/0.215/0.25 MG-35 MCG tablet TAKE  1 TABLET BY MOUTH DAILY 84 tablet 2   No current facility-administered medications on file prior to visit.     Allergies  Allergen Reactions  . Sulfa Antibiotics Anaphylaxis  . Doxycycline     rash  . Lisinopril     cough  . Cefzil [Cefprozil] Rash    Past Medical History:  Diagnosis Date  . ADD (attention deficit disorder)   . Depression   . Diabetes mellitus without complication ([place])   . History of seizures   . Hyperlipidemia   . Hypertension     No past surgical history on file.  Family History  Problem Relation Age of Onset  . Hyperlipidemia Mother   . Hypertension Mother   . Hyperlipidemia Father   . Hypertension Father   . Diabetes Father   . Cancer Maternal Aunt 40       breast CA  . Breast cancer Cousin   . Breast cancer Other   . Breast cancer Other     Social History   Socioeconomic History  . Marital status: Single    Spouse name: Not on file  . Number of children: Not on file  . Years of education: Not on file  . Highest education level: Not on file  Occupational History  . Not on file  Social Needs  . Financial resource strain: Not on file  . Food insecurity:    Worry: Not on file    Inability: Not on file  . Transportation needs:    Medical: Not on file    Non-medical: Not on file  Tobacco Use  . Smoking status: Never Smoker  . Smokeless tobacco: Never Used  Substance and Sexual Activity  . Alcohol use: Yes    Alcohol/week: 0.0 standard drinks    Comment: rarely  . Drug use: No  . Sexual activity: Not on file  Lifestyle  . Physical activity:    Days per week: Not on file    Minutes per session: Not on file  . Stress: Not on file  Relationships  . Social connections:    Talks on phone: Not on file    Gets together: Not on file    Attends religious service: Not on file    Active member of club or organization: Not on file    Attends meetings of clubs or organizations: Not on file    Relationship status: Not on file  .  Intimate partner violence:    Fear of current or ex partner: Not on file    Emotionally abused:  Not on file    Physically abused: Not on file    Forced sexual activity: Not on file  Other Topics Concern  . Not on file  Social History Narrative  . Not on file   The PMH, PSH, Social History, Family History, Medications, and allergies have been reviewed in [facility], and have been updated if relevant.   Review of Systems  Constitutional: Negative.   HENT: Negative.   Respiratory: Negative.   Cardiovascular: Negative.   Gastrointestinal: Negative.   Endocrine: Negative.   Genitourinary: Negative.   Musculoskeletal: Negative.   Allergic/Immunologic: Negative.   Neurological: Negative.   Hematological: Negative.   Psychiatric/Behavioral: Positive for decreased concentration, dysphoric mood and sleep disturbance. Negative for agitation, behavioral problems, confusion, hallucinations, self-injury and suicidal ideas. The patient is nervous/anxious. The patient is not hyperactive.   All other systems reviewed and are negative.      Objective:    BP (!) 196/110 (BP Location: Left Arm, Cuff Size: Normal)   Pulse 88   Temp 98.1 F (36.7 C) (Oral)   Ht _0  (1.626 m)   Wt 223 lb 9.6 oz (101.4 kg)   LMP 06/04/2018 (Exact Date)   SpO2 96%   BMI 38.38 kg/m    Physical Exam Vitals signs and nursing note reviewed.  Constitutional:      Appearance: Normal appearance.  HENT:     Head: Normocephalic.     Nose: Nose normal.     Mouth/Throat:     Mouth: Mucous membranes are moist.  Eyes:     Extraocular Movements: Extraocular movements intact.  Neck:     Musculoskeletal: Normal range of motion.  Cardiovascular:     Rate and Rhythm: Normal rate and regular rhythm.     Pulses: Normal pulses.     Heart sounds: Normal heart sounds.  Pulmonary:     Effort: Pulmonary effort is normal.     Breath sounds: Normal breath sounds.  Musculoskeletal: Normal range of motion.     Left lower leg:  No edema.  Skin:    General: Skin is warm and dry.  Neurological:     General: No focal deficit present.     Mental Status: She is alert and oriented to person, place, and time.  Psychiatric:        Attention and Perception: Attention normal.        Mood and Affect: Mood is anxious. Affect is tearful.        Behavior: Behavior normal.        Thought Content: Thought content normal.        Cognition and Memory: Cognition and memory normal.           Assessment & Plan:   Diabetes mellitus without complication ([place]) - Plan: POCT HgB A1C, Lipid Profile, Comp Met (CMET), Urine Microalbumin w/creat. ratio  Essential hypertension - Plan: Lipid Profile, Comp Met (CMET)  Hyperlipidemia, unspecified hyperlipidemia type - Plan: Lipid Profile, Comp Met (CMET) No follow-ups on file.

## 2018-06-11 ENCOUNTER — Encounter: Payer: Self-pay | Admitting: Family Medicine

## 2018-06-11 ENCOUNTER — Ambulatory Visit: Payer: BLUE CROSS/BLUE SHIELD | Admitting: Family Medicine

## 2018-06-11 VITALS — BP 196/110 | HR 88 | Temp 98.1°F | Ht 64.0 in | Wt 223.6 lb

## 2018-06-11 DIAGNOSIS — F4323 Adjustment disorder with mixed anxiety and depressed mood: Secondary | ICD-10-CM | POA: Diagnosis not present

## 2018-06-11 DIAGNOSIS — E119 Type 2 diabetes mellitus without complications: Secondary | ICD-10-CM | POA: Diagnosis not present

## 2018-06-11 DIAGNOSIS — I10 Essential (primary) hypertension: Secondary | ICD-10-CM | POA: Diagnosis not present

## 2018-06-11 DIAGNOSIS — E785 Hyperlipidemia, unspecified: Secondary | ICD-10-CM

## 2018-06-11 LAB — COMPREHENSIVE METABOLIC PANEL
ALT: 69 U/L — ABNORMAL HIGH (ref 0–35)
AST: 63 U/L — ABNORMAL HIGH (ref 0–37)
Albumin: 4.1 g/dL (ref 3.5–5.2)
Alkaline Phosphatase: 62 U/L (ref 39–117)
BUN: 20 mg/dL (ref 6–23)
CO2: 20 mEq/L (ref 19–32)
Calcium: 9.6 mg/dL (ref 8.4–10.5)
Chloride: 98 mEq/L (ref 96–112)
Creatinine, Ser: 0.98 mg/dL (ref 0.40–1.20)
GFR: 60.41 mL/min (ref >60.00)
Glucose, Bld: 242 mg/dL — ABNORMAL HIGH (ref 70–99)
Potassium: 4.6 mEq/L (ref 3.5–5.1)
Sodium: 134 mEq/L — ABNORMAL LOW (ref 135–145)
TOTAL PROTEIN: 7.3 g/dL (ref 6.0–8.3)
Total Bilirubin: 0.5 mg/dL (ref 0.2–1.2)

## 2018-06-11 LAB — MICROALBUMIN / CREATININE URINE RATIO
Creatinine,U: 68.1 mg/dL
Microalb Creat Ratio: 5.1 mg/g (ref 0.0–30.0)
Microalb, Ur: 3.5 mg/dL — ABNORMAL HIGH (ref 0.0–1.9)

## 2018-06-11 LAB — LIPID PANEL
Cholesterol: 243 mg/dL — ABNORMAL HIGH (ref 0–200)
HDL: 51.7 mg/dL (ref >39.00)
NonHDL: 191.68
Total CHOL/HDL Ratio: 5
Triglycerides: 318 mg/dL — ABNORMAL HIGH (ref 0.0–149.0)
VLDL: 63.6 mg/dL — ABNORMAL HIGH (ref 0.0–40.0)

## 2018-06-11 LAB — POCT GLYCOSYLATED HEMOGLOBIN (HGB A1C)
HbA1c POC (<> result, manual entry): 9.5 % (ref 4.0–5.6)
Hemoglobin A1C: 9.5 % — AB (ref 4.0–5.6)

## 2018-06-11 LAB — LDL CHOLESTEROL, DIRECT: Direct LDL: 151 mg/dL

## 2018-06-11 MED ORDER — METFORMIN HCL ER 500 MG PO TB24: ORAL_TABLET | ORAL | 1 refills | Status: DC

## 2018-06-11 MED ORDER — BUPROPION HCL ER (XL) 300 MG PO TB24: 300.0000 mg | ORAL_TABLET | Freq: Every day | ORAL | 3 refills | Status: DC

## 2018-06-11 NOTE — Assessment & Plan Note (Signed)
Deteriorated- there was a misunderstanding- she did not realize she should be taking 1000 mg XL daily ( two 500 XL mg tabs daily).  She has only been taking Metformin 500 XL mg daily.

## 2018-06-11 NOTE — Assessment & Plan Note (Addendum)
Deteriorated.  Hypertensive urgency today without symptoms.  Advised to STOP taking Sudafed.  Take Cozaar today- likely due to anxiety and sudafed. She will check her BP at work and update me later today.

## 2018-06-11 NOTE — Patient Instructions (Addendum)
Great to see you. I am so sorry for your loss.  Please take Metformin 500 mg XL- two tabs by mouth daily.  Check your blood pressure later today and please update me.  We are increasing your wellbutrin to 300 mg XL daily. Please keep me updated.  We are also referring you to a therapist in [place].

## 2018-06-11 NOTE — Assessment & Plan Note (Addendum)
>  40 minutes spent in face to face time with patient, >50% spent in counselling or coordination of care discussing anxiety, depression, HTN, diabetes. Increase Wellbutrin 300 mg XL daily. Refer to therapist for psychotherapy.

## 2018-06-12 ENCOUNTER — Other Ambulatory Visit: Payer: Self-pay | Admitting: Family Medicine

## 2018-06-12 MED ORDER — HYDROCHLOROTHIAZIDE 25 MG PO TABS: 12.5000 mg | ORAL_TABLET | Freq: Every day | ORAL | 0 refills | Status: DC

## 2018-06-13 ENCOUNTER — Other Ambulatory Visit: Payer: Self-pay | Admitting: Family Medicine

## 2018-06-29 ENCOUNTER — Encounter: Payer: Self-pay | Admitting: Family Medicine

## 2018-07-02 ENCOUNTER — Telehealth: Payer: Self-pay

## 2018-07-02 DIAGNOSIS — E785 Hyperlipidemia, unspecified: Secondary | ICD-10-CM

## 2018-07-02 DIAGNOSIS — E119 Type 2 diabetes mellitus without complications: Secondary | ICD-10-CM

## 2018-07-02 MED ORDER — ROSUVASTATIN CALCIUM 5 MG PO TABS: 5.0000 mg | ORAL_TABLET | Freq: Every day | ORAL | 1 refills | Status: DC

## 2018-07-02 NOTE — Telephone Encounter (Signed)
Per TA: Crestor 5 mg nightly. Please repeat CMET, lipid panel in 8 weeks.  I have sent in Crestor 5mg  1qhs #30 with 1 refill/future order created for CMP & Lipid for 8 wks/sent pt a MyChart message stating this and to schedule a lab visit for 8 weeks from now/thx dmf

## 2018-07-04 ENCOUNTER — Other Ambulatory Visit: Payer: Self-pay | Admitting: Family Medicine

## 2018-07-04 MED ORDER — HYDROCHLOROTHIAZIDE 25 MG PO TABS: 12.5000 mg | ORAL_TABLET | Freq: Every day | ORAL | 0 refills | Status: DC

## 2018-07-05 ENCOUNTER — Other Ambulatory Visit: Payer: Self-pay | Admitting: Family Medicine

## 2018-07-05 MED ORDER — MONTELUKAST SODIUM 10 MG PO TABS: 10.0000 mg | ORAL_TABLET | Freq: Every day | ORAL | 3 refills | Status: DC

## 2018-07-13 ENCOUNTER — Ambulatory Visit (INDEPENDENT_AMBULATORY_CARE_PROVIDER_SITE_OTHER): Payer: BLUE CROSS/BLUE SHIELD | Admitting: Psychology

## 2018-07-13 DIAGNOSIS — F411 Generalized anxiety disorder: Secondary | ICD-10-CM | POA: Diagnosis not present

## 2018-07-23 ENCOUNTER — Encounter: Payer: Self-pay | Admitting: Family Medicine

## 2018-07-23 ENCOUNTER — Other Ambulatory Visit: Payer: Self-pay | Admitting: Family Medicine

## 2018-07-23 DIAGNOSIS — Z01419 Encounter for gynecological examination (general) (routine) without abnormal findings: Secondary | ICD-10-CM

## 2018-07-24 ENCOUNTER — Encounter: Payer: Self-pay | Admitting: Family Medicine

## 2018-07-24 ENCOUNTER — Ambulatory Visit: Payer: BLUE CROSS/BLUE SHIELD | Admitting: Family Medicine

## 2018-07-24 ENCOUNTER — Ambulatory Visit (INDEPENDENT_AMBULATORY_CARE_PROVIDER_SITE_OTHER): Payer: BLUE CROSS/BLUE SHIELD | Admitting: Family Medicine

## 2018-07-24 DIAGNOSIS — I10 Essential (primary) hypertension: Secondary | ICD-10-CM

## 2018-07-24 MED ORDER — LOSARTAN POTASSIUM 100 MG PO TABS: ORAL_TABLET | ORAL | 3 refills | Status: DC

## 2018-07-24 MED ORDER — AMLODIPINE BESYLATE 5 MG PO TABS: 5.0000 mg | ORAL_TABLET | Freq: Every day | ORAL | 3 refills | Status: DC

## 2018-07-24 NOTE — Progress Notes (Signed)
Virtual Visit via Video   I connected with Samantha Moses on 07/24/18 at  9:40 AM EDT by a video enabled telemedicine application and verified that I am speaking with the correct person using two identifiers. Location patient: Home Location provider: [facility], Office Persons participating in the virtual visit: TAMELIA MICHALOWSKI, Arnette Norris, MD   I discussed the limitations of evaluation and management by telemedicine and the availability of in person appointments. The patient expressed understanding and agreed to proceed.  Subjective:   HPI:    HTN-  Currently taking Cozaar 100 mg daily and HCTZ 12.5 mg twice daily.  She sent the following my chart message yesterday:  Even with the HCTZ increased to 12.5 mg twice a day, my blood pressure is still between 150 -160 / 80 - 90. I think we should try adding a different medication because the HCTZ is having minimal effect.   Lisinopril worked well but caused a cough. She has been having headaches.  Under quite a bit of stress at work.  Lab Results  Component Value Date   CREATININE 0.98 06/11/2018   Lab Results  Component Value Date   NA 134 (L) 06/11/2018   K 4.6 06/11/2018   CL 98 06/11/2018   CO2 20 06/11/2018    ROS: See pertinent positives and negatives per HPI.  Review of Systems  Constitutional: Negative.   HENT: Negative.   Respiratory: Negative.   Cardiovascular: Negative.   Neurological: Positive for headaches. Negative for dizziness, tremors, seizures, syncope, facial asymmetry, speech difficulty, weakness, light-headedness and numbness.  Hematological: Negative.   Psychiatric/Behavioral: The patient is nervous/anxious.   All other systems reviewed and are negative.    Patient Active Problem List   Diagnosis Date Noted  . Allergic rhinitis 08/22/2012  . Hyperlipidemia   . Diabetes mellitus without complication ([place])   . Hypertension   . Adjustment reaction with anxiety and depression   . ADD  (attention deficit disorder)     Social History   Tobacco Use  . Smoking status: Never Smoker  . Smokeless tobacco: Never Used  Substance Use Topics  . Alcohol use: Yes    Alcohol/week: 0.0 standard drinks    Comment: rarely    Current Outpatient Medications:  .  albuterol (PROVENTIL HFA;VENTOLIN HFA) 108 (90 BASE) MCG/ACT inhaler, Inhale 2 puffs into the lungs every 4 (four) hours as needed for wheezing., Disp: , Rfl:  .  amLODipine (NORVASC) 5 MG tablet, Take 1 tablet (5 mg total) by mouth daily., Disp: 90 tablet, Rfl: 3 .  buPROPion (WELLBUTRIN XL) 300 MG 24 hr tablet, Take 1 tablet (300 mg total) by mouth daily., Disp: 90 tablet, Rfl: 3 .  glucose blood (TRUE METRIX BLOOD GLUCOSE TEST) test strip, Dx E11.9, Disp: 100 each, Rfl: 2 .  levocetirizine (XYZAL) 5 MG tablet, TAKE 1 TABLET(5 MG) BY MOUTH EVERY EVENING, Disp: 90 tablet, Rfl: 3 .  losartan (COZAAR) 100 MG tablet, TAKE 1 TABLET(100 MG) BY MOUTH DAILY, Disp: 90 tablet, Rfl: 3 .  metFORMIN (GLUCOPHAGE-XR) 500 MG 24 hr tablet, Take 2qam, Disp: 180 tablet, Rfl: 1 .  montelukast (SINGULAIR) 10 MG tablet, TAKE 1 TABLET(10 MG) BY MOUTH AT BEDTIME, Disp: 90 tablet, Rfl: 3 .  montelukast (SINGULAIR) 10 MG tablet, Take 1 tablet (10 mg total) by mouth at bedtime., Disp: 90 tablet, Rfl: 3 .  Multiple Vitamin (MULTIVITAMIN) tablet, Take 1 tablet by mouth daily., Disp: , Rfl:  .  Omega-3 Fatty Acids (FISH  OIL TRIPLE STRENGTH) 1400 MG CAPS, Take by mouth 2 (two) times daily., Disp: , Rfl:  .  polyethylene glycol powder (GLYCOLAX/MIRALAX) powder, Take 255 g by mouth once., Disp: 850 g, Rfl: 0 .  pravastatin (PRAVACHOL) 40 MG tablet, TAKE 1 TABLET(40 MG) BY MOUTH DAILY, Disp: 90 tablet, Rfl: 0 .  rosuvastatin (CRESTOR) 5 MG tablet, Take 1 tablet (5 mg total) by mouth at bedtime., Disp: 30 tablet, Rfl: 1 .  TRI-SPRINTEC 0.18/0.215/0.25 MG-35 MCG tablet, TAKE 1 TABLET BY MOUTH DAILY, Disp: 84 tablet, Rfl: 2  Allergies  Allergen Reactions  .  Sulfa Antibiotics Anaphylaxis  . Doxycycline     rash  . Lisinopril     cough  . Cefzil [Cefprozil] Rash    Objective:   VITALS: Per patient if applicable, see vitals. GENERAL: Alert, appears well and in no acute distress. HEENT: Atraumatic, conjunctiva clear, no obvious abnormalities on inspection of external nose and ears. NECK: Normal movements of the head and neck. CARDIOPULMONARY: No increased WOB. Speaking in clear sentences. I:E ratio WNL.  MS: Moves all visible extremities without noticeable abnormality. PSYCH: Pleasant and cooperative, well-groomed. Speech normal rate and rhythm. Affect is appropriate. Insight and judgement are appropriate. Attention is focused, linear, and appropriate.  NEURO: CN grossly intact. Oriented as arrived to appointment on time with no prompting. Moves both UE equally.  SKIN: No obvious lesions, wounds, erythema, or cyanosis noted on face or hands.  Assessment and Plan:   Diagnoses and all orders for this visit:  Essential hypertension    . Reviewed expectations re: course of current medical issues. . Discussed self-management of symptoms. . Outlined signs and symptoms indicating need for more acute intervention. . Patient verbalized understanding and all questions were answered. Marland Kitchen Health Maintenance issues including appropriate healthy diet, exercise, and smoking avoidance were discussed with patient. . See orders for this visit as documented in the electronic medical record.  Arnette Norris, MD 07/24/2018

## 2018-07-24 NOTE — Progress Notes (Unsigned)
Virtual Visit via Video   I connected with Samantha Moses on 07/24/18 at  9:40 AM EDT by a video enabled telemedicine application and verified that I am speaking with the correct person using two identifiers. Location patient: Home Location provider: [facility], Office Persons participating in the virtual visit: Samantha Moses, Arnette Norris, MD   I discussed the limitations of evaluation and management by telemedicine and the availability of in person appointments. The patient expressed understanding and agreed to proceed.  Subjective:   HPI:   HTN-  Currently taking Cozaar 100 mg daily and HCTZ 12.5 mg twice daily.  She sent the following my chart message yesterday:  Even with the HCTZ increased to 12.5 mg twice a day, my blood pressure is still between 150 -160 / 80 - 90. I think we should try adding a different medication because the HCTZ is having minimal effect.    ROS: See pertinent positives and negatives per HPI.  Patient Active Problem List   Diagnosis Date Noted  . Allergic rhinitis 08/22/2012  . Hyperlipidemia   . Diabetes mellitus without complication ([place])   . Hypertension   . Adjustment reaction with anxiety and depression   . ADD (attention deficit disorder)     Social History   Tobacco Use  . Smoking status: Never Smoker  . Smokeless tobacco: Never Used  Substance Use Topics  . Alcohol use: Yes    Alcohol/week: 0.0 standard drinks    Comment: rarely    Current Outpatient Medications:  .  albuterol (PROVENTIL HFA;VENTOLIN HFA) 108 (90 BASE) MCG/ACT inhaler, Inhale 2 puffs into the lungs every 4 (four) hours as needed for wheezing., Disp: , Rfl:  .  buPROPion (WELLBUTRIN XL) 300 MG 24 hr tablet, Take 1 tablet (300 mg total) by mouth daily., Disp: 90 tablet, Rfl: 3 .  glucose blood (TRUE METRIX BLOOD GLUCOSE TEST) test strip, Dx E11.9, Disp: 100 each, Rfl: 2 .  hydrochlorothiazide (HYDRODIURIL) 25 MG tablet, Take 0.5 tablets (12.5 mg total) by mouth  daily for 30 days., Disp: 90 tablet, Rfl: 0 .  levocetirizine (XYZAL) 5 MG tablet, TAKE 1 TABLET(5 MG) BY MOUTH EVERY EVENING, Disp: 90 tablet, Rfl: 3 .  losartan (COZAAR) 100 MG tablet, TAKE 1 TABLET(100 MG) BY MOUTH DAILY, Disp: 90 tablet, Rfl: 0 .  metFORMIN (GLUCOPHAGE-XR) 500 MG 24 hr tablet, Take 2qam, Disp: 180 tablet, Rfl: 1 .  montelukast (SINGULAIR) 10 MG tablet, TAKE 1 TABLET(10 MG) BY MOUTH AT BEDTIME, Disp: 90 tablet, Rfl: 3 .  montelukast (SINGULAIR) 10 MG tablet, Take 1 tablet (10 mg total) by mouth at bedtime., Disp: 90 tablet, Rfl: 3 .  Multiple Vitamin (MULTIVITAMIN) tablet, Take 1 tablet by mouth daily., Disp: , Rfl:  .  Omega-3 Fatty Acids (FISH OIL TRIPLE STRENGTH) 1400 MG CAPS, Take by mouth 2 (two) times daily., Disp: , Rfl:  .  polyethylene glycol powder (GLYCOLAX/MIRALAX) powder, Take 255 g by mouth once., Disp: 850 g, Rfl: 0 .  pravastatin (PRAVACHOL) 40 MG tablet, TAKE 1 TABLET(40 MG) BY MOUTH DAILY, Disp: 90 tablet, Rfl: 0 .  rosuvastatin (CRESTOR) 5 MG tablet, Take 1 tablet (5 mg total) by mouth at bedtime., Disp: 30 tablet, Rfl: 1 .  TRI-SPRINTEC 0.18/0.215/0.25 MG-35 MCG tablet, TAKE 1 TABLET BY MOUTH DAILY, Disp: 84 tablet, Rfl: 2  Allergies  Allergen Reactions  . Sulfa Antibiotics Anaphylaxis  . Doxycycline     rash  . Lisinopril     cough  .  Cefzil [Cefprozil] Rash    Objective:   VITALS: Per patient if applicable, see vitals. GENERAL: Alert, appears well and in no acute distress. HEENT: Atraumatic, conjunctiva clear, no obvious abnormalities on inspection of external nose and ears. NECK: Normal movements of the head and neck. CARDIOPULMONARY: No increased WOB. Speaking in clear sentences. I:E ratio WNL.  MS: Moves all visible extremities without noticeable abnormality. PSYCH: Pleasant and cooperative, well-groomed. Speech normal rate and rhythm. Affect is appropriate. Insight and judgement are appropriate. Attention is focused, linear, and  appropriate.  NEURO: CN grossly intact. Oriented as arrived to appointment on time with no prompting. Moves both UE equally.  SKIN: No obvious lesions, wounds, erythema, or cyanosis noted on face or hands.  Assessment and Plan:   Diagnoses and all orders for this visit:  Essential hypertension    . Reviewed expectations re: course of current medical issues. . Discussed self-management of symptoms. . Outlined signs and symptoms indicating need for more acute intervention. . Patient verbalized understanding and all questions were answered. Marland Kitchen Health Maintenance issues including appropriate healthy diet, exercise, and smoking avoidance were discussed with patient. . See orders for this visit as documented in the electronic medical record.  Arnette Norris, MD 07/24/2018

## 2018-07-24 NOTE — Assessment & Plan Note (Signed)
Deteriorated. >15 minutes spent in face to face time with patient, >50% spent in counselling or coordination of care. We agreed to d/c HCTZ completely as she felt it never worked.  Continue cozaar 100 mg daily, add norvasc 5 mg daily.  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   The patient indicates understanding of these issues and agrees with the plan.

## 2018-08-09 ENCOUNTER — Ambulatory Visit (INDEPENDENT_AMBULATORY_CARE_PROVIDER_SITE_OTHER): Payer: BLUE CROSS/BLUE SHIELD | Admitting: Psychology

## 2018-08-09 DIAGNOSIS — F411 Generalized anxiety disorder: Secondary | ICD-10-CM | POA: Diagnosis not present

## 2018-08-10 ENCOUNTER — Telehealth: Payer: Self-pay | Admitting: Family Medicine

## 2018-08-10 NOTE — Telephone Encounter (Signed)
Spoke with pharmacy, advised Dr. Hulen Shouts note on 06/11/2018 stating pt need to be on Wellbutrin 300 XL.

## 2018-08-10 NOTE — Telephone Encounter (Signed)
Copied from [place] [phone]. Topic: Quick Communication - Rx Refill/Question >> Aug 10, 2018 10:21 AM Scherrie Gerlach wrote: Medication: buPROPion (WELLBUTRIN XL) 300 MG 24 hr tablet  buPROPion (WELLBUTRIN XL) 150 XR filled 05/22/2018 (has refills) prescribed by Dr Weyman Pedro, pharmacist at [facility] called to ask if Dr Deborra Medina is aware pt is also taking the 150 XR prescribed by another dr. Deborha Payment found auditing profile and would like a call back to advise if they should refill  [facility] [place], [place] [phone] (Phone) [phone] (Fax)

## 2018-08-23 ENCOUNTER — Ambulatory Visit (INDEPENDENT_AMBULATORY_CARE_PROVIDER_SITE_OTHER): Payer: BLUE CROSS/BLUE SHIELD | Admitting: Psychology

## 2018-08-23 DIAGNOSIS — F411 Generalized anxiety disorder: Secondary | ICD-10-CM

## 2018-08-27 ENCOUNTER — Encounter: Payer: Self-pay | Admitting: Family Medicine

## 2018-08-27 ENCOUNTER — Other Ambulatory Visit: Payer: Self-pay

## 2018-08-27 DIAGNOSIS — E119 Type 2 diabetes mellitus without complications: Secondary | ICD-10-CM

## 2018-08-27 DIAGNOSIS — I10 Essential (primary) hypertension: Secondary | ICD-10-CM

## 2018-08-27 DIAGNOSIS — E785 Hyperlipidemia, unspecified: Secondary | ICD-10-CM

## 2018-08-27 NOTE — Progress Notes (Signed)
Pt agrees to go to [facility] in [place] on 5.12.20 for CMP/Lipid/A1C/we discussed having a Virtual Visit to discuss lab results when they come in since she was requesting a medication change/faxed orders over/thx dmf

## 2018-08-28 ENCOUNTER — Other Ambulatory Visit: Payer: Self-pay | Admitting: Family Medicine

## 2018-08-28 DIAGNOSIS — E119 Type 2 diabetes mellitus without complications: Secondary | ICD-10-CM | POA: Diagnosis not present

## 2018-08-28 DIAGNOSIS — E785 Hyperlipidemia, unspecified: Secondary | ICD-10-CM | POA: Diagnosis not present

## 2018-08-28 DIAGNOSIS — I10 Essential (primary) hypertension: Secondary | ICD-10-CM | POA: Diagnosis not present

## 2018-08-29 LAB — COMPREHENSIVE METABOLIC PANEL
ALT: 51 IU/L — ABNORMAL HIGH (ref 0–32)
AST: 45 IU/L — ABNORMAL HIGH (ref 0–40)
Albumin/Globulin Ratio: 1.9 (ref 1.2–2.2)
Albumin: 4.8 g/dL (ref 3.8–4.8)
Alkaline Phosphatase: 62 IU/L (ref 39–117)
BUN/Creatinine Ratio: 18 (ref 9–23)
BUN: 16 mg/dL (ref 6–24)
Bilirubin Total: 0.4 mg/dL (ref 0.0–1.2)
CO2: 17 mmol/L — ABNORMAL LOW (ref 20–29)
Calcium: 9.3 mg/dL (ref 8.7–10.2)
Chloride: 97 mmol/L (ref 96–106)
Creatinine, Ser: 0.87 mg/dL (ref 0.57–1.00)
GFR calc Af Amer: 91 mL/min/{1.73_m2} (ref >59)
GFR calc non Af Amer: 79 mL/min/{1.73_m2} (ref >59)
Globulin, Total: 2.5 g/dL (ref 1.5–4.5)
Glucose: 202 mg/dL — ABNORMAL HIGH (ref 65–99)
Potassium: 3.7 mmol/L (ref 3.5–5.2)
Sodium: 136 mmol/L (ref 134–144)
Total Protein: 7.3 g/dL (ref 6.0–8.5)

## 2018-08-29 LAB — LIPID PANEL
Chol/HDL Ratio: 3.5 ratio (ref 0.0–4.4)
Cholesterol, Total: 182 mg/dL (ref 100–199)
HDL: 52 mg/dL (ref >39)
LDL Calculated: 77 mg/dL (ref 0–99)
Triglycerides: 265 mg/dL — ABNORMAL HIGH (ref 0–149)
VLDL Cholesterol Cal: 53 mg/dL — ABNORMAL HIGH (ref 5–40)

## 2018-08-29 LAB — HEMOGLOBIN A1C
Est. average glucose Bld gHb Est-mCnc: 200 mg/dL
Hgb A1c MFr Bld: 8.6 % — ABNORMAL HIGH (ref 4.8–5.6)

## 2018-09-03 ENCOUNTER — Encounter: Payer: Self-pay | Admitting: Family Medicine

## 2018-09-03 ENCOUNTER — Ambulatory Visit (INDEPENDENT_AMBULATORY_CARE_PROVIDER_SITE_OTHER): Payer: BLUE CROSS/BLUE SHIELD | Admitting: Family Medicine

## 2018-09-03 VITALS — BP 138/76 | HR 92 | Wt 223.8 lb

## 2018-09-03 DIAGNOSIS — E119 Type 2 diabetes mellitus without complications: Secondary | ICD-10-CM

## 2018-09-03 DIAGNOSIS — E785 Hyperlipidemia, unspecified: Secondary | ICD-10-CM

## 2018-09-03 DIAGNOSIS — I10 Essential (primary) hypertension: Secondary | ICD-10-CM | POA: Diagnosis not present

## 2018-09-03 MED ORDER — ROSUVASTATIN CALCIUM 5 MG PO TABS: 5.0000 mg | ORAL_TABLET | Freq: Every day | ORAL | 3 refills | Status: DC

## 2018-09-03 MED ORDER — METFORMIN HCL 1000 MG PO TABS: 1000.0000 mg | ORAL_TABLET | Freq: Two times a day (BID) | ORAL | 3 refills | Status: DC

## 2018-09-03 MED ORDER — AMLODIPINE BESYLATE 10 MG PO TABS: 10.0000 mg | ORAL_TABLET | Freq: Every day | ORAL | 3 refills | Status: DC

## 2018-09-03 NOTE — Assessment & Plan Note (Signed)
Much improved when we changed from pravachol to crestor. No changes made today. Lab Results  Component Value Date   CHOL 182 08/28/2018   HDL 52 08/28/2018   LDLCALC 77 08/28/2018   LDLDIRECT 151.0 06/11/2018   TRIG 265 (H) 08/28/2018   CHOLHDL 3.5 08/28/2018   Lab Results  Component Value Date   ALT 51 (H) 08/28/2018   AST 45 (H) 08/28/2018   ALKPHOS 62 08/28/2018   BILITOT 0.4 08/28/2018

## 2018-09-03 NOTE — Assessment & Plan Note (Addendum)
Improved with increased dose of amlodipine 10 mg daily (advised to take this in the evening)- new Rx sent to pharmacy on file. Continue Cozaar 100 mg every morning.

## 2018-09-03 NOTE — Assessment & Plan Note (Addendum)
Improved but remains poorly controlled. >25 minutes spent in face to face time with patient, >50% spent in counselling or coordination of care discussing diabetes treatment, HTN, HLD. She feels she did better on the the non XL Metformin.  eRx sent for Metformin 1000 mg twice daily. Follow up in 3 months.

## 2018-09-03 NOTE — Progress Notes (Signed)
Virtual Visit via Video   Due to the COVID-19 pandemic, this visit was completed with telemedicine (audio/video) technology to reduce patient and provider exposure as well as to preserve personal protective equipment.   I connected with Samantha Moses by a video enabled telemedicine application and verified that I am speaking with the correct person using two identifiers. Location patient: Home Location provider: [facility], Office Persons participating in the virtual visit: FRANCISCA LANGENDERFER, Arnette Norris, MD   I discussed the limitations of evaluation and management by telemedicine and the availability of in person appointments. The patient expressed understanding and agreed to proceed.  Care Team   Patient Care Team: Lucille Passy, MD as PCP - General (Family Medicine)  Subjective:   HPI:   DM- received the following message from patient:  I have not been checking my blood sugar like I should. Since I started checking more frequently, I have realized that it is consistently high. I would like to go back to using the 1000 mg IR tablets twice daily. I don't think my body is metabolizing the ER tablets the same way as the IR.   We therefore ordered labs for her to have done at [facility] which did show her a1c is still quite high although improved from 2 months ago.  a1c is down to 8.6 from 9.5. She is currently taking Metformin 1000 mg XL daily.  Denies any episodes of hypoglycemia. Lab Results  Component Value Date   HGBA1C 8.6 (H) 08/28/2018   HLD- taking Crestor 5 mg daily.  Cholesterol has improved significantly.  Lab Results  Component Value Date   CHOL 182 08/28/2018   HDL 52 08/28/2018   LDLCALC 77 08/28/2018   LDLDIRECT 151.0 06/11/2018   TRIG 265 (H) 08/28/2018   CHOLHDL 3.5 08/28/2018   HTN- I last saw her for this on 07/24/18.  Note reviewed.  BP remained high at that time so we agreed to d/c HCTZ as she felt it never worked for her even at increased dose.  We  continued cozaar 100 mg daily and added Norvasc 5 mg daily.  She had to increase the amlodipine to 10 mg daily because she noticed her BP was running high at night.  She denies any side effects with Norvasc.  Lab Results  Component Value Date   CREATININE 0.87 08/28/2018      Review of Systems  Constitutional: Negative.   HENT: Negative.   Eyes: Negative.   Respiratory: Negative.   Cardiovascular: Negative.   Gastrointestinal: Negative.   Genitourinary: Negative.   Musculoskeletal: Negative.   Skin: Negative.   Neurological: Negative.   Endo/Heme/Allergies: Negative.   All other systems reviewed and are negative.    Patient Active Problem List   Diagnosis Date Noted  . Allergic rhinitis 08/22/2012  . Hyperlipidemia   . Diabetes mellitus without complication ([place])   . Hypertension   . Adjustment reaction with anxiety and depression   . ADD (attention deficit disorder)     Social History   Tobacco Use  . Smoking status: Never Smoker  . Smokeless tobacco: Never Used  Substance Use Topics  . Alcohol use: Yes    Alcohol/week: 0.0 standard drinks    Comment: rarely    Current Outpatient Medications:  .  albuterol (PROVENTIL HFA;VENTOLIN HFA) 108 (90 BASE) MCG/ACT inhaler, Inhale 2 puffs into the lungs every 4 (four) hours as needed for wheezing., Disp: , Rfl:  .  amLODipine (NORVASC) 5 MG tablet, Take  1 tablet (5 mg total) by mouth daily., Disp: 90 tablet, Rfl: 3 .  buPROPion (WELLBUTRIN XL) 300 MG 24 hr tablet, Take 1 tablet (300 mg total) by mouth daily., Disp: 90 tablet, Rfl: 3 .  glucose blood (TRUE METRIX BLOOD GLUCOSE TEST) test strip, Dx E11.9, Disp: 100 each, Rfl: 2 .  levocetirizine (XYZAL) 5 MG tablet, TAKE 1 TABLET(5 MG) BY MOUTH EVERY EVENING, Disp: 90 tablet, Rfl: 3 .  losartan (COZAAR) 100 MG tablet, TAKE 1 TABLET(100 MG) BY MOUTH DAILY, Disp: 90 tablet, Rfl: 3 .  metFORMIN (GLUCOPHAGE-XR) 500 MG 24 hr tablet, Take 2qam, Disp: 180 tablet, Rfl: 1 .   montelukast (SINGULAIR) 10 MG tablet, Take 1 tablet (10 mg total) by mouth at bedtime., Disp: 90 tablet, Rfl: 3 .  Multiple Vitamin (MULTIVITAMIN) tablet, Take 1 tablet by mouth daily., Disp: , Rfl:  .  Omega-3 Fatty Acids (FISH OIL TRIPLE STRENGTH) 1400 MG CAPS, Take by mouth 2 (two) times daily., Disp: , Rfl:  .  polyethylene glycol powder (GLYCOLAX/MIRALAX) powder, Take 255 g by mouth once., Disp: 850 g, Rfl: 0 .  rosuvastatin (CRESTOR) 5 MG tablet, Take 1 tablet (5 mg total) by mouth at bedtime., Disp: 90 tablet, Rfl: 3 .  TRI-SPRINTEC 0.18/0.215/0.25 MG-35 MCG tablet, TAKE 1 TABLET BY MOUTH DAILY, Disp: 84 tablet, Rfl: 2  Allergies  Allergen Reactions  . Sulfa Antibiotics Anaphylaxis  . Doxycycline     rash  . Lisinopril     cough  . Cefzil [Cefprozil] Rash    Objective:  BP 138/76   Pulse 92   Wt 223 lb 12.8 oz (101.5 kg)   LMP 08/26/2018   BMI 38.42 kg/m   VITALS: Per patient if applicable, see vitals. GENERAL: Alert, appears well and in no acute distress. HEENT: Atraumatic, conjunctiva clear, no obvious abnormalities on inspection of external nose and ears. NECK: Normal movements of the head and neck. CARDIOPULMONARY: No increased WOB. Speaking in clear sentences. I:E ratio WNL.  MS: Moves all visible extremities without noticeable abnormality. PSYCH: Pleasant and cooperative, well-groomed. Speech normal rate and rhythm. Affect is appropriate. Insight and judgement are appropriate. Attention is focused, linear, and appropriate.  NEURO: CN grossly intact. Oriented as arrived to appointment on time with no prompting. Moves both UE equally.  SKIN: No obvious lesions, wounds, erythema, or cyanosis noted on face or hands.    Assessment and Plan:   Samantha Moses was seen today for follow-up.  Diagnoses and all orders for this visit:  Essential hypertension  Hyperlipidemia, unspecified hyperlipidemia type  Diabetes mellitus without complication ([place])  Other orders -      rosuvastatin (CRESTOR) 5 MG tablet; Take 1 tablet (5 mg total) by mouth at bedtime.    Marland Kitchen COVID-19 Education: The signs and symptoms of COVID-19 were discussed with the patient and how to seek care for testing if needed. The importance of social distancing was discussed today. . Reviewed expectations re: course of current medical issues. . Discussed self-management of symptoms. . Outlined signs and symptoms indicating need for more acute intervention. . Patient verbalized understanding and all questions were answered. Marland Kitchen Health Maintenance issues including appropriate healthy diet, exercise, and smoking avoidance were discussed with patient. . See orders for this visit as documented in the electronic medical record.  Arnette Norris, MD  Records requested if needed. Time spent: 25 minutes, of which >50% was spent in obtaining information about her symptoms, reviewing her previous labs, evaluations, and treatments, counseling her about her condition (  please see the discussed topics above), and developing a plan to further investigate it; she had a number of questions which I addressed.

## 2018-09-06 ENCOUNTER — Ambulatory Visit (INDEPENDENT_AMBULATORY_CARE_PROVIDER_SITE_OTHER): Payer: BLUE CROSS/BLUE SHIELD | Admitting: Psychology

## 2018-09-06 DIAGNOSIS — F411 Generalized anxiety disorder: Secondary | ICD-10-CM

## 2018-09-20 ENCOUNTER — Ambulatory Visit: Payer: BLUE CROSS/BLUE SHIELD | Admitting: Psychology

## 2018-09-20 LAB — HM DIABETES EYE EXAM

## 2018-09-24 ENCOUNTER — Encounter: Payer: Self-pay | Admitting: Family Medicine

## 2018-09-26 ENCOUNTER — Ambulatory Visit (INDEPENDENT_AMBULATORY_CARE_PROVIDER_SITE_OTHER): Payer: BC Managed Care – PPO | Admitting: Psychology

## 2018-09-26 DIAGNOSIS — F411 Generalized anxiety disorder: Secondary | ICD-10-CM

## 2018-10-26 ENCOUNTER — Ambulatory Visit (INDEPENDENT_AMBULATORY_CARE_PROVIDER_SITE_OTHER): Payer: BC Managed Care – PPO | Admitting: Psychology

## 2018-10-26 DIAGNOSIS — F411 Generalized anxiety disorder: Secondary | ICD-10-CM | POA: Diagnosis not present

## 2018-11-22 ENCOUNTER — Ambulatory Visit (INDEPENDENT_AMBULATORY_CARE_PROVIDER_SITE_OTHER): Payer: BC Managed Care – PPO | Admitting: Psychology

## 2018-11-22 DIAGNOSIS — F411 Generalized anxiety disorder: Secondary | ICD-10-CM | POA: Diagnosis not present

## 2019-01-17 ENCOUNTER — Ambulatory Visit: Payer: BC Managed Care – PPO | Admitting: Psychology

## 2019-02-09 ENCOUNTER — Other Ambulatory Visit: Payer: Self-pay | Admitting: Family Medicine

## 2019-07-12 ENCOUNTER — Other Ambulatory Visit: Payer: Self-pay

## 2019-07-12 MED ORDER — LEVOCETIRIZINE DIHYDROCHLORIDE 5 MG PO TABS: ORAL_TABLET | ORAL | 0 refills | Status: DC

## 2019-07-26 ENCOUNTER — Other Ambulatory Visit: Payer: Self-pay

## 2019-07-26 DIAGNOSIS — Z01419 Encounter for gynecological examination (general) (routine) without abnormal findings: Secondary | ICD-10-CM

## 2019-07-26 MED ORDER — LOSARTAN POTASSIUM 100 MG PO TABS: ORAL_TABLET | ORAL | 0 refills | Status: DC

## 2019-07-31 ENCOUNTER — Telehealth: Payer: Self-pay | Admitting: General Practice

## 2019-07-31 MED ORDER — BUPROPION HCL ER (XL) 300 MG PO TB24: 300.0000 mg | ORAL_TABLET | Freq: Every day | ORAL | 3 refills | Status: DC

## 2019-07-31 NOTE — Telephone Encounter (Signed)
Patient aware that Rx sent to pharmacy  

## 2019-07-31 NOTE — Telephone Encounter (Signed)
Patient is calling and requested a refill for bupropion sent to [facility] in [place] until appointment with new provider on 5/19. Pls advise. CB is [phone]

## 2019-07-31 NOTE — Telephone Encounter (Signed)
Rx refilled.

## 2019-08-31 ENCOUNTER — Encounter: Payer: Self-pay | Admitting: Internal Medicine

## 2019-09-04 ENCOUNTER — Ambulatory Visit: Payer: 59 | Admitting: Internal Medicine

## 2019-09-17 ENCOUNTER — Other Ambulatory Visit: Payer: Self-pay

## 2019-09-17 MED ORDER — MONTELUKAST SODIUM 10 MG PO TABS: ORAL_TABLET | ORAL | 0 refills | Status: DC

## 2019-10-17 LAB — HM DIABETES EYE EXAM

## 2019-10-24 ENCOUNTER — Encounter: Payer: Self-pay | Admitting: Internal Medicine

## 2019-10-24 ENCOUNTER — Other Ambulatory Visit: Payer: Self-pay | Admitting: Internal Medicine

## 2019-10-25 ENCOUNTER — Ambulatory Visit: Payer: 59 | Admitting: Internal Medicine

## 2019-10-25 ENCOUNTER — Encounter: Payer: Self-pay | Admitting: Internal Medicine

## 2019-10-25 ENCOUNTER — Other Ambulatory Visit: Payer: Self-pay

## 2019-10-25 VITALS — BP 118/78 | HR 90 | Temp 98.3°F | Ht 63.0 in | Wt 219.0 lb

## 2019-10-25 DIAGNOSIS — Z1211 Encounter for screening for malignant neoplasm of colon: Secondary | ICD-10-CM

## 2019-10-25 DIAGNOSIS — Z1231 Encounter for screening mammogram for malignant neoplasm of breast: Secondary | ICD-10-CM

## 2019-10-25 DIAGNOSIS — E1169 Type 2 diabetes mellitus with other specified complication: Secondary | ICD-10-CM | POA: Diagnosis not present

## 2019-10-25 DIAGNOSIS — Z1159 Encounter for screening for other viral diseases: Secondary | ICD-10-CM | POA: Diagnosis not present

## 2019-10-25 DIAGNOSIS — I10 Essential (primary) hypertension: Secondary | ICD-10-CM

## 2019-10-25 DIAGNOSIS — E785 Hyperlipidemia, unspecified: Secondary | ICD-10-CM

## 2019-10-25 DIAGNOSIS — E118 Type 2 diabetes mellitus with unspecified complications: Secondary | ICD-10-CM | POA: Diagnosis not present

## 2019-10-25 MED ORDER — AMLODIPINE BESYLATE 10 MG PO TABS: 10.0000 mg | ORAL_TABLET | Freq: Every day | ORAL | 1 refills | Status: DC

## 2019-10-25 MED ORDER — LEVOCETIRIZINE DIHYDROCHLORIDE 5 MG PO TABS: ORAL_TABLET | ORAL | 0 refills | Status: DC

## 2019-10-25 MED ORDER — TRUE METRIX BLOOD GLUCOSE TEST VI STRP: ORAL_STRIP | 2 refills | Status: DC

## 2019-10-25 MED ORDER — METFORMIN HCL 1000 MG PO TABS: 1000.0000 mg | ORAL_TABLET | Freq: Two times a day (BID) | ORAL | 1 refills | Status: DC

## 2019-10-25 MED ORDER — ROSUVASTATIN CALCIUM 5 MG PO TABS: 5.0000 mg | ORAL_TABLET | Freq: Every day | ORAL | 1 refills | Status: DC

## 2019-10-25 MED ORDER — LOSARTAN POTASSIUM 100 MG PO TABS: ORAL_TABLET | ORAL | 1 refills | Status: DC

## 2019-10-25 NOTE — Progress Notes (Signed)
Date:  10/25/2019   Name:  Samantha Moses   DOB:  [birth year]   MRN:  [id number]   Chief Complaint: Establish Care (sees Cephus Shelling; needs presc. for test strips/ has not had metformin in X1 month )  Diabetes She presents for her follow-up diabetic visit. She has type 2 diabetes mellitus. The initial diagnosis of diabetes was made 25 years ago. Her disease course has been stable. Pertinent negatives for hypoglycemia include no dizziness, headaches, nervousness/anxiousness or tremors. Pertinent negatives for diabetes include no chest pain, no fatigue, no polydipsia and no polyuria. Symptoms are stable. Current diabetic treatment includes oral agent (monotherapy). Her weight is stable. She is following a diabetic diet. An ACE inhibitor/angiotensin II receptor blocker is being taken.  Hypertension This is a chronic problem. The problem is controlled. Pertinent negatives include no chest pain, headaches, palpitations or shortness of breath. Past treatments include calcium channel blockers and angiotensin blockers. The current treatment provides significant improvement.  Hyperlipidemia This is a chronic problem. The problem is controlled. Pertinent negatives include no chest pain or shortness of breath. Current antihyperlipidemic treatment includes statins. The current treatment provides significant improvement of lipids. There are no compliance problems.   Severe allergies/reactive airways disease - she takes claritin and singulair daily to control her sx.  These are year round but much worse in the spring.  She has never been diagnosed with asthma. She used immunotherapy years ago.   Lab Results  Component Value Date   CREATININE 0.87 08/28/2018   BUN 16 08/28/2018   NA 136 08/28/2018   K 3.7 08/28/2018   CL 97 08/28/2018   CO2 17 (L) 08/28/2018   Lab Results  Component Value Date   CHOL 182 08/28/2018   HDL 52 08/28/2018   LDLCALC 77 08/28/2018   LDLDIRECT 151.0 06/11/2018   TRIG 265  (H) 08/28/2018   CHOLHDL 3.5 08/28/2018   Lab Results  Component Value Date   TSH 2.97 04/25/2017   Lab Results  Component Value Date   HGBA1C 8.6 (H) 08/28/2018   Lab Results  Component Value Date   WBC 8.4 04/25/2017   HGB 14.8 04/25/2017   HCT 44.5 04/25/2017   MCV 90.1 04/25/2017   PLT 263.0 04/25/2017   Lab Results  Component Value Date   ALT 51 (H) 08/28/2018   AST 45 (H) 08/28/2018   ALKPHOS 62 08/28/2018   BILITOT 0.4 08/28/2018     Review of Systems  Constitutional: Negative for appetite change, fatigue, fever and unexpected weight change.  HENT: Negative for tinnitus and trouble swallowing.   Eyes: Negative for visual disturbance.  Respiratory: Negative for cough, chest tightness, shortness of breath and wheezing.   Cardiovascular: Negative for chest pain, palpitations and leg swelling.  Gastrointestinal: Negative for abdominal pain.  Endocrine: Negative for polydipsia and polyuria.  Genitourinary: Negative for dysuria and hematuria.  Musculoskeletal: Negative for arthralgias, gait problem and joint swelling.  Allergic/Immunologic: Positive for environmental allergies.  Neurological: Negative for dizziness, tremors, numbness and headaches.  Psychiatric/Behavioral: Negative for dysphoric mood and sleep disturbance. The patient is not nervous/anxious.     Patient Active Problem List   Diagnosis Date Noted  . Allergic rhinitis 08/22/2012  . Hyperlipidemia associated with type 2 diabetes mellitus ([place])   . Type II diabetes mellitus with complication ([place])   . Essential hypertension   . Adjustment reaction with anxiety and depression   . ADD (attention deficit disorder)     Allergies  Allergen Reactions  .  Sulfa Antibiotics Anaphylaxis  . Doxycycline     rash  . Lisinopril     cough  . Cefzil [Cefprozil] Rash    History reviewed. No pertinent surgical history.  Social History   Tobacco Use  . Smoking status: Never Smoker  . Smokeless tobacco:  Never Used  Vaping Use  . Vaping Use: Never used  Substance Use Topics  . Alcohol use: Not Currently    Alcohol/week: 0.0 standard drinks    Comment: rarely  . Drug use: No     Medication list has been reviewed and updated.  Current Meds  Medication Sig  . albuterol (PROVENTIL HFA;VENTOLIN HFA) 108 (90 BASE) MCG/ACT inhaler Inhale 2 puffs into the lungs every 4 (four) hours as needed for wheezing.  Marland Kitchen amLODipine (NORVASC) 10 MG tablet Take 1 tablet (10 mg total) by mouth daily.  Marland Kitchen buPROPion (WELLBUTRIN XL) 300 MG 24 hr tablet Take 1 tablet (300 mg total) by mouth daily.  Marland Kitchen glucose blood (TRUE METRIX BLOOD GLUCOSE TEST) test strip Dx E11.9  . levocetirizine (XYZAL) 5 MG tablet Take 1qpm (Plz sched appt with new provider)  . losartan (COZAAR) 100 MG tablet Take 1qd (Plz sched an appt with a new provider)  . metFORMIN (GLUCOPHAGE) 1000 MG tablet Take 1 tablet (1,000 mg total) by mouth 2 (two) times daily with a meal.  . montelukast (SINGULAIR) 10 MG tablet Take 1qd (sched appt with new provider for future fills)  . Multiple Vitamin (MULTIVITAMIN) tablet Take 1 tablet by mouth daily.  . Omega-3 Fatty Acids (FISH OIL TRIPLE STRENGTH) 1400 MG CAPS Take by mouth 2 (two) times daily.  . rosuvastatin (CRESTOR) 5 MG tablet Take 1 tablet (5 mg total) by mouth at bedtime.  . sertraline (ZOLOFT) 50 MG tablet Take 50 mg by mouth daily.  . traZODone (DESYREL) 50 MG tablet Take 50 mg by mouth at bedtime.    PHQ 2/9 Scores 10/25/2019 06/11/2018 12/21/2017 04/25/2017  PHQ - 2 Score 0 3 1 2   PHQ- 9 Score 1 15 7 9     GAD 7 : Generalized Anxiety Score 10/25/2019 06/11/2018 12/21/2017  Nervous, Anxious, on Edge 1 3 1   Control/stop worrying 1 2 0  Worry too much - different things 1 2 0  Trouble relaxing 0 1 0  Restless 0 1 0  Easily annoyed or irritable 0 3 0  Afraid - awful might happen 0 0 0  Total GAD 7 Score 3 12 1   Anxiety Difficulty Not difficult at all Very difficult Not difficult at all    BP  Readings from Last 3 Encounters:  10/25/19 118/78  09/03/18 138/76  06/11/18 (!) 196/110    Physical Exam Vitals and nursing note reviewed.  Constitutional:      General: She is not in acute distress.    Appearance: She is well-developed.  HENT:     Head: Normocephalic and atraumatic.  Cardiovascular:     Rate and Rhythm: Normal rate and regular rhythm.     Pulses: Normal pulses.     Heart sounds: No murmur heard.   Pulmonary:     Effort: Pulmonary effort is normal. No respiratory distress.     Breath sounds: No wheezing or rhonchi.  Musculoskeletal:        General: Normal range of motion.     Cervical back: Normal range of motion and neck supple.     Right lower leg: No edema.     Left lower leg: No edema.  Lymphadenopathy:  Cervical: No cervical adenopathy.  Skin:    General: Skin is warm and dry.     Capillary Refill: Capillary refill takes less than 2 seconds.     Findings: No rash.  Neurological:     General: No focal deficit present.     Mental Status: She is alert and oriented to person, place, and time.  Psychiatric:        Behavior: Behavior normal.        Thought Content: Thought content normal.     Wt Readings from Last 3 Encounters:  10/25/19 219 lb (99.3 kg)  09/03/18 223 lb 12.8 oz (101.5 kg)  06/11/18 223 lb 9.6 oz (101.4 kg)    BP 118/78   Pulse 90   Temp 98.3 F (36.8 C) (Oral)   Ht 5\' 3"  (1.6 m)   Wt 219 lb (99.3 kg)   SpO2 95%   BMI 38.79 kg/m   Assessment and Plan: 1. Type II diabetes mellitus with complication (HCC) Clinically stable by exam and report without s/s of hypoglycemia. DM complicated by HTN and lipids. Tolerating medications - metformin -  well without side effects or other concerns. Has been out of meds for a few weeks so BS will likely be higher - glucose blood (TRUE METRIX BLOOD GLUCOSE TEST) test strip; Dx E11.9  Dispense: 100 each; Refill: 2 - metFORMIN (GLUCOPHAGE) 1000 MG tablet; Take 1 tablet (1,000 mg total)  by mouth 2 (two) times daily with a meal.  Dispense: 180 tablet; Refill: 1 - Comprehensive metabolic panel - Hemoglobin A1c - amLODipine (NORVASC) 10 MG tablet; Take 1 tablet (10 mg total) by mouth daily.  Dispense: 90 tablet; Refill: 1  2. Essential hypertension Clinically stable exam with well controlled BP on losartan. Tolerating medications without side effects at this time. Pt to continue current regimen and low sodium diet; benefits of regular exercise as able discussed. - CBC with Differential/Platelet - losartan (COZAAR) 100 MG tablet; Take 1qd (Plz sched an appt with a new provider)  Dispense: 90 tablet; Refill: 1  3. Hyperlipidemia associated with type 2 diabetes mellitus ([place]) On appropriate high intensity statin therapy - Lipid panel - rosuvastatin (CRESTOR) 5 MG tablet; Take 1 tablet (5 mg total) by mouth at bedtime.  Dispense: 90 tablet; Refill: 1  4. Need for hepatitis C screening test - Hepatitis C antibody  5. Encounter for screening mammogram for breast cancer To be scheduled by patient at [place]; Future  6. Colon cancer screening Due for screening - Ambulatory referral to Gastroenterology   Partially dictated using Dragon software. Any errors are unintentional.  Halina Maidens, MD [facility]  10/25/2019

## 2019-10-26 LAB — COMPREHENSIVE METABOLIC PANEL
ALT: 68 IU/L — ABNORMAL HIGH (ref 0–32)
AST: 38 IU/L (ref 0–40)
Albumin/Globulin Ratio: 2.2 (ref 1.2–2.2)
Albumin: 5 g/dL — ABNORMAL HIGH (ref 3.8–4.8)
Alkaline Phosphatase: 74 IU/L (ref 48–121)
BUN/Creatinine Ratio: 22 (ref 9–23)
BUN: 22 mg/dL (ref 6–24)
Bilirubin Total: 0.4 mg/dL (ref 0.0–1.2)
CO2: 20 mmol/L (ref 20–29)
Calcium: 10.2 mg/dL (ref 8.7–10.2)
Chloride: 96 mmol/L (ref 96–106)
Creatinine, Ser: 1.01 mg/dL — ABNORMAL HIGH (ref 0.57–1.00)
GFR calc Af Amer: 75 mL/min/{1.73_m2} (ref >59)
GFR calc non Af Amer: 65 mL/min/{1.73_m2} (ref >59)
Globulin, Total: 2.3 g/dL (ref 1.5–4.5)
Glucose: 209 mg/dL — ABNORMAL HIGH (ref 65–99)
Potassium: 4.5 mmol/L (ref 3.5–5.2)
Sodium: 134 mmol/L (ref 134–144)
Total Protein: 7.3 g/dL (ref 6.0–8.5)

## 2019-10-26 LAB — CBC WITH DIFFERENTIAL/PLATELET

## 2019-10-26 LAB — LIPID PANEL
Chol/HDL Ratio: 6.3 ratio — ABNORMAL HIGH (ref 0.0–4.4)
Cholesterol, Total: 314 mg/dL — ABNORMAL HIGH (ref 100–199)
HDL: 50 mg/dL (ref >39)
LDL Chol Calc (NIH): 206 mg/dL — ABNORMAL HIGH (ref 0–99)
Triglycerides: 288 mg/dL — ABNORMAL HIGH (ref 0–149)
VLDL Cholesterol Cal: 58 mg/dL — ABNORMAL HIGH (ref 5–40)

## 2019-10-26 LAB — HEPATITIS C ANTIBODY: Hep C Virus Ab: <0.1 s/co ratio (ref 0.0–0.9)

## 2019-10-26 LAB — HEMOGLOBIN A1C

## 2019-11-06 ENCOUNTER — Telehealth: Payer: Self-pay

## 2019-11-06 ENCOUNTER — Other Ambulatory Visit: Payer: Self-pay

## 2019-11-06 ENCOUNTER — Telehealth (INDEPENDENT_AMBULATORY_CARE_PROVIDER_SITE_OTHER): Payer: Self-pay | Admitting: Gastroenterology

## 2019-11-06 DIAGNOSIS — Z1211 Encounter for screening for malignant neoplasm of colon: Secondary | ICD-10-CM

## 2019-11-06 MED ORDER — NA SULFATE-K SULFATE-MG SULF 17.5-3.13-1.6 GM/177ML PO SOLN: 1.0000 | Freq: Once | ORAL | 0 refills | Status: AC

## 2019-11-06 MED ORDER — CLENPIQ 10-3.5-12 MG-GM -GM/160ML PO SOLN
ORAL | 0 refills | Status: DC
Start: 2019-11-06 — End: 2020-03-04

## 2019-11-06 MED ORDER — PEG 3350-KCL-NA BICARB-NACL 420 G PO SOLR
ORAL | 0 refills | Status: DC
Start: 2019-11-06 — End: 2020-03-04

## 2019-11-06 NOTE — Progress Notes (Signed)
Gastroenterology Pre-Procedure Review  Request Date: Wed 11/27/19 Requesting Physician: Dr. Bonna Gains  PATIENT REVIEW QUESTIONS: The patient responded to the following health history questions as indicated:    1. Are you having any GI issues? no 2. Do you have a personal history of Polyps? no 3. Do you have a family history of Colon Cancer or Polyps? yes (maternal uncle colon polyps, paternal uncle colon cancer) 4. Diabetes Mellitus? yes (type 2 oral meds) 5. Joint replacements in the past 12 months?no 6. Major health problems in the past 3 months?no 7. Any artificial heart valves, MVP, or defibrillator?no    MEDICATIONS & ALLERGIES:    Patient reports the following regarding taking any anticoagulation/antiplatelet therapy:   Plavix, Coumadin, Eliquis, Xarelto, Lovenox, Pradaxa, Brilinta, or Effient? no Aspirin? no  Patient confirms/reports the following medications:  Current Outpatient Medications  Medication Sig Dispense Refill  . amLODipine (NORVASC) 10 MG tablet Take 1 tablet (10 mg total) by mouth daily. 90 tablet 1  . buPROPion (WELLBUTRIN XL) 300 MG 24 hr tablet Take 1 tablet (300 mg total) by mouth daily. 90 tablet 3  . glucose blood (TRUE METRIX BLOOD GLUCOSE TEST) test strip Dx E11.9 100 each 2  . levocetirizine (XYZAL) 5 MG tablet Take 1qpm (Plz sched appt with new provider) 90 tablet 0  . losartan (COZAAR) 100 MG tablet Take 1qd (Plz sched an appt with a new provider) 90 tablet 1  . metFORMIN (GLUCOPHAGE) 1000 MG tablet Take 1 tablet (1,000 mg total) by mouth 2 (two) times daily with a meal. 180 tablet 1  . montelukast (SINGULAIR) 10 MG tablet Take 1qd (sched appt with new provider for future fills) 90 tablet 0  . Multiple Vitamin (MULTIVITAMIN) tablet Take 1 tablet by mouth daily.    . Omega-3 Fatty Acids (FISH OIL TRIPLE STRENGTH) 1400 MG CAPS Take by mouth 2 (two) times daily.    . rosuvastatin (CRESTOR) 5 MG tablet Take 1 tablet (5 mg total) by mouth at bedtime. 90  tablet 1  . sertraline (ZOLOFT) 50 MG tablet Take 50 mg by mouth daily.    . traZODone (DESYREL) 50 MG tablet Take 50 mg by mouth at bedtime.    Marland Kitchen albuterol (PROVENTIL HFA;VENTOLIN HFA) 108 (90 BASE) MCG/ACT inhaler Inhale 2 puffs into the lungs every 4 (four) hours as needed for wheezing. (Patient not taking: Reported on 11/06/2019)    . Na Sulfate-K Sulfate-Mg Sulf 17.5-3.13-1.6 GM/177ML SOLN Take 1 kit by mouth once for 1 dose. 354 mL 0   No current facility-administered medications for this visit.    Patient confirms/reports the following allergies:  Allergies  Allergen Reactions  . Sulfa Antibiotics Anaphylaxis  . Doxycycline     rash  . Lisinopril     cough  . Cefzil [Cefprozil] Rash    No orders of the defined types were placed in this encounter.   AUTHORIZATION INFORMATION Primary Insurance: 1D#: Group #:  Secondary Insurance: 1D#: Group #:  SCHEDULE INFORMATION: Date: Wed 11/27/19 Time: Location:[facility]

## 2019-11-06 NOTE — Telephone Encounter (Signed)
Pt contacted the office in regards to her telephone triage for her colonoscopy.  Explained to her that I called, confirmed her phone number and it was noted incorrectly on the appt.  It is correct in her demographics.  Apologized for the missed call.  Thanks,  [place], [place]

## 2019-11-06 NOTE — Telephone Encounter (Signed)
Unable to contact patient for her 9:30am telephone nurse visit to schedule colonoscopy.  Called yesterday to confirm and got no answer.  Called this morning at 9:17am and 9:30am.  LVM for her to call office back to reschedule.  Thanks,  [place], [place]

## 2019-11-14 ENCOUNTER — Encounter: Payer: Self-pay | Admitting: Internal Medicine

## 2019-11-14 NOTE — Telephone Encounter (Signed)
Please Advise.  KP

## 2019-11-25 ENCOUNTER — Other Ambulatory Visit
Admission: RE | Admit: 2019-11-25 | Discharge: 2019-11-25 | Disposition: A | Discharge status: Home / Self Care | Payer: 59 | Source: Ambulatory Visit | Attending: Gastroenterology | Admitting: Gastroenterology

## 2019-11-25 ENCOUNTER — Other Ambulatory Visit: Payer: Self-pay

## 2019-11-25 DIAGNOSIS — Z20822 Contact with and (suspected) exposure to covid-19: Secondary | ICD-10-CM | POA: Diagnosis not present

## 2019-11-25 DIAGNOSIS — Z01812 Encounter for preprocedural laboratory examination: Secondary | ICD-10-CM | POA: Diagnosis present

## 2019-11-26 ENCOUNTER — Telehealth: Payer: Self-pay

## 2019-11-26 ENCOUNTER — Encounter: Payer: Self-pay | Admitting: Gastroenterology

## 2019-11-26 LAB — SARS CORONAVIRUS 2 (TAT 6-24 HRS): SARS Coronavirus 2: NEGATIVE

## 2019-11-26 NOTE — Telephone Encounter (Signed)
Patient's PCP was able to send the referral. Therefore, Samantha Moses was able to send her insurance company the referral. Patient will be getting her colonoscopy tomorrow. Patient is aware.

## 2019-11-26 NOTE — Telephone Encounter (Signed)
Patient had called and left a voicemail stating that she wanted her procedure to be cancelled from 11/27/2019 since her PCP did not send a referral in order for her insurance to pay for it.  I then called patient back today and had to leave her a detailed message letting her know that her procedure was cancelled since her PCP never sent a referral for her insurance to pay for it. I told her to call her PCP so they could send [place] a referral and we could reschedule her procedure.

## 2019-11-27 ENCOUNTER — Ambulatory Visit: Payer: 59 | Admitting: Certified Registered Nurse Anesthetist

## 2019-11-27 ENCOUNTER — Ambulatory Visit
Admission: RE | Admit: 2019-11-27 | Discharge: 2019-11-27 | Disposition: A | Discharge status: Home / Self Care | Payer: 59 | Attending: Gastroenterology | Admitting: Gastroenterology

## 2019-11-27 ENCOUNTER — Encounter
Admission: RE | Disposition: A | Discharge status: Home / Self Care | Payer: Self-pay | Source: Home / Self Care | Attending: Gastroenterology

## 2019-11-27 ENCOUNTER — Other Ambulatory Visit: Payer: Self-pay

## 2019-11-27 DIAGNOSIS — Z7984 Long term (current) use of oral hypoglycemic drugs: Secondary | ICD-10-CM | POA: Diagnosis not present

## 2019-11-27 DIAGNOSIS — F329 Major depressive disorder, single episode, unspecified: Secondary | ICD-10-CM | POA: Insufficient documentation

## 2019-11-27 DIAGNOSIS — K635 Polyp of colon: Secondary | ICD-10-CM

## 2019-11-27 DIAGNOSIS — Z1211 Encounter for screening for malignant neoplasm of colon: Secondary | ICD-10-CM | POA: Diagnosis not present

## 2019-11-27 DIAGNOSIS — G4730 Sleep apnea, unspecified: Secondary | ICD-10-CM | POA: Diagnosis not present

## 2019-11-27 DIAGNOSIS — D122 Benign neoplasm of ascending colon: Secondary | ICD-10-CM | POA: Diagnosis not present

## 2019-11-27 DIAGNOSIS — Z882 Allergy status to sulfonamides status: Secondary | ICD-10-CM | POA: Insufficient documentation

## 2019-11-27 DIAGNOSIS — E119 Type 2 diabetes mellitus without complications: Secondary | ICD-10-CM | POA: Insufficient documentation

## 2019-11-27 DIAGNOSIS — Z79899 Other long term (current) drug therapy: Secondary | ICD-10-CM | POA: Diagnosis not present

## 2019-11-27 DIAGNOSIS — I10 Essential (primary) hypertension: Secondary | ICD-10-CM | POA: Insufficient documentation

## 2019-11-27 DIAGNOSIS — Z888 Allergy status to other drugs, medicaments and biological substances status: Secondary | ICD-10-CM | POA: Diagnosis not present

## 2019-11-27 DIAGNOSIS — E785 Hyperlipidemia, unspecified: Secondary | ICD-10-CM | POA: Diagnosis not present

## 2019-11-27 DIAGNOSIS — K621 Rectal polyp: Secondary | ICD-10-CM | POA: Diagnosis not present

## 2019-11-27 DIAGNOSIS — Z87892 Personal history of anaphylaxis: Secondary | ICD-10-CM | POA: Insufficient documentation

## 2019-11-27 DIAGNOSIS — Z881 Allergy status to other antibiotic agents status: Secondary | ICD-10-CM | POA: Diagnosis not present

## 2019-11-27 HISTORY — PX: COLONOSCOPY WITH PROPOFOL: SHX5780

## 2019-11-27 LAB — POCT PREGNANCY, URINE: Preg Test, Ur: NEGATIVE

## 2019-11-27 LAB — GLUCOSE, CAPILLARY: Glucose-Capillary: 162 mg/dL — ABNORMAL HIGH (ref 70–99)

## 2019-11-27 SURGERY — COLONOSCOPY
Anesthesia: General

## 2019-11-27 SURGERY — COLONOSCOPY WITH PROPOFOL
Anesthesia: General

## 2019-11-27 MED ORDER — PROPOFOL 500 MG/50ML IV EMUL
INTRAVENOUS | Status: DC | PRN
  Administered 2019-11-27: 160 ug/kg/min via INTRAVENOUS

## 2019-11-27 MED ORDER — SODIUM CHLORIDE 0.9 % IV SOLN: INTRAVENOUS | Status: DC

## 2019-11-27 MED ORDER — PROPOFOL 10 MG/ML IV BOLUS
INTRAVENOUS | Status: DC | PRN
  Administered 2019-11-27: 80 mg via INTRAVENOUS

## 2019-11-27 NOTE — Anesthesia Preprocedure Evaluation (Signed)
Anesthesia Evaluation  Patient identified by MRN, date of birth, ID band Patient awake    Reviewed: Allergy & Precautions, NPO status , Patient's Chart, lab work & pertinent test results  History of Anesthesia Complications Negative for: history of anesthetic complications  Airway Mallampati: II  TM Distance: >3 FB Neck ROM: Full    Dental no notable dental hx.    Pulmonary sleep apnea (hasn't used CPAP for several years, did not tolerate using it) , neg COPD,    breath sounds clear to auscultation- rhonchi (-) wheezing      Cardiovascular hypertension, Pt. on medications (-) CAD, (-) Past MI, (-) Cardiac Stents and (-) CABG  Rhythm:Regular Rate:Normal - Systolic murmurs and - Diastolic murmurs    Neuro/Psych neg Seizures PSYCHIATRIC DISORDERS Depression negative neurological ROS     GI/Hepatic negative GI ROS, Neg liver ROS,   Endo/Other  diabetes, Oral Hypoglycemic Agents  Renal/GU negative Renal ROS     Musculoskeletal negative musculoskeletal ROS (+)   Abdominal (+) + obese,   Peds  Hematology negative hematology ROS (+)   Anesthesia Other Findings Past Medical History: No date: ADD (attention deficit disorder) No date: Depression No date: Diabetes mellitus without complication (HCC) No date: Hyperlipidemia No date: Hypertension   Reproductive/Obstetrics                             Anesthesia Physical Anesthesia Plan  ASA: III  Anesthesia Plan: General   Post-op Pain Management:    Induction: Intravenous  PONV Risk Score and Plan: 2 and Propofol infusion  Airway Management Planned: Natural Airway  Additional Equipment:   Intra-op Plan:   Post-operative Plan:   Informed Consent: I have reviewed the patients History and Physical, chart, labs and discussed the procedure including the risks, benefits and alternatives for the proposed anesthesia with the patient or  authorized representative who has indicated his/her understanding and acceptance.     Dental advisory given  Plan Discussed with: CRNA and Anesthesiologist  Anesthesia Plan Comments:         Anesthesia Quick Evaluation

## 2019-11-27 NOTE — Op Note (Signed)
[facility] Gastroenterology Patient Name: Samantha Moses Procedure Date: 11/27/2019 11:25 AM MRN: [id number] Account #: [id number] Date of Birth: [birth year] Admit Type: Outpatient Age: 49 Room: [facility] ENDO ROOM 2 Gender: Female Note Status: Finalized Procedure:             Colonoscopy Indications:           Screening for colorectal malignant neoplasm Providers:             Anita Laguna B. Bonna Gains MD, MD Referring MD:          Halina Maidens, MD (Referring MD) Medicines:             Monitored Anesthesia Care Complications:         No immediate complications. Procedure:             Pre-Anesthesia Assessment:                        - ASA Grade Assessment: II - A patient with mild                         systemic disease.                        - Prior to the procedure, a History and Physical was                         performed, and patient medications, allergies and                         sensitivities were reviewed. The patient's tolerance                         of previous anesthesia was reviewed.                        - The risks and benefits of the procedure and the                         sedation options and risks were discussed with the                         patient. All questions were answered and informed                         consent was obtained.                        - Patient identification and proposed procedure were                         verified prior to the procedure by the physician, the                         nurse, the anesthesiologist, the anesthetist and the                         technician. The procedure was verified in the                         procedure room.  After obtaining informed consent, the colonoscope was                         passed under direct vision. Throughout the procedure,                         the patient's blood pressure, pulse, and oxygen                         saturations were monitored  continuously. The                         Colonoscope was introduced through the anus and                         advanced to the the cecum, identified by appendiceal                         orifice and ileocecal valve. The colonoscopy was                         performed with ease. The patient tolerated the                         procedure well. The quality of the bowel preparation                         was good except the ascending colon was fair and the                         cecum was fair. Findings:      The perianal and digital rectal examinations were normal.      Two sessile polyps were found in the rectum and ascending colon. The       polyps were 3 to 4 mm in size. These polyps were removed with a cold       biopsy forceps. Resection and retrieval were complete.      The exam was otherwise without abnormality.      The rectum, sigmoid colon, descending colon, transverse colon, ascending       colon and cecum appeared normal.      The retroflexed view of the distal rectum and anal verge was normal and       showed no anal or rectal abnormalities. Impression:            - Two 3 to 4 mm polyps in the rectum and in the                         ascending colon, removed with a cold biopsy forceps.                         Resected and retrieved.                        - The examination was otherwise normal.                        - The rectum, sigmoid colon, descending colon,  transverse colon, ascending colon and cecum are normal.                        - The distal rectum and anal verge are normal on                         retroflexion view. Recommendation:        - Discharge patient to home (with escort).                        - Advance diet as tolerated.                        - Continue present medications.                        - Await pathology results.                        - Repeat colonoscopy in 3 years, with 2 day prep.                         - The findings and recommendations were discussed with                         the patient.                        - The findings and recommendations were discussed with                         the patient's family.                        - Return to primary care physician as previously                         scheduled. Procedure Code(s):     --- Professional ---                        [phone], Colonoscopy, flexible; with biopsy, single or                         multiple Diagnosis Code(s):     --- Professional ---                        Z12.11, Encounter for screening for malignant neoplasm                         of colon                        K62.1, Rectal polyp                        K63.5, Polyp of colon CPT copyright 2019 American Medical Association. All rights reserved. The codes documented in this report are preliminary and upon coder review may  be revised to meet current compliance requirements.  Vonda Antigua, MD Margretta Sidle B. Bonna Gains MD, MD 11/27/2019 12:08:31 PM This report has been signed electronically. Number  of Addenda: 0 Note Initiated On: 11/27/2019 11:25 AM Scope Withdrawal Time: 0 hours 20 minutes 59 seconds  Total Procedure Duration: 0 hours 27 minutes 24 seconds  Estimated Blood Loss:  Estimated blood loss: none.      [facility]

## 2019-11-27 NOTE — H&P (Signed)
Vonda Antigua, MD [address] [place], [place], [place], [address] [address], [place], [place], [place], [address] Phone: [phone]  Fax: [phone]  Primary Care Physician:  Glean Hess, MD   Pre-Procedure History & Physical: HPI:  Samantha Moses is a 49 y.o. female is here for a colonoscopy.   Past Medical History:  Diagnosis Date  . ADD (attention deficit disorder)   . Depression   . Diabetes mellitus without complication ([place])   . Hyperlipidemia   . Hypertension     Past Surgical History:  Procedure Laterality Date  . none      Prior to Admission medications   Medication Sig Start Date End Date Taking? Authorizing Provider  albuterol (PROVENTIL HFA;VENTOLIN HFA) 108 (90 BASE) MCG/ACT inhaler Inhale 2 puffs into the lungs every 4 (four) hours as needed for wheezing.    Yes [provider]  amLODipine (NORVASC) 10 MG tablet Take 1 tablet (10 mg total) by mouth daily. 10/25/19  Yes Glean Hess, MD  buPROPion (WELLBUTRIN XL) 300 MG 24 hr tablet Take 1 tablet (300 mg total) by mouth daily. 07/31/19  Yes Cirigliano, Stanton Kidney K, DO  glucose blood (TRUE METRIX BLOOD GLUCOSE TEST) test strip Dx E11.9 10/25/19  Yes Glean Hess, MD  losartan (COZAAR) 100 MG tablet Take 1qd (Plz sched an appt with a new provider) 10/25/19  Yes Glean Hess, MD  metFORMIN (GLUCOPHAGE) 1000 MG tablet Take 1 tablet (1,000 mg total) by mouth 2 (two) times daily with a meal. 10/25/19  Yes Glean Hess, MD  montelukast (SINGULAIR) 10 MG tablet Take 1qd (sched appt with new provider for future fills) 09/17/19  Yes Libby Maw, MD  Multiple Vitamin (MULTIVITAMIN) tablet Take 1 tablet by mouth daily.   Yes [provider]  Omega-3 Fatty Acids (FISH OIL TRIPLE STRENGTH) 1400 MG CAPS Take by mouth 2 (two) times daily.   Yes [provider]  polyethylene glycol-electrolytes (NULYTELY) 420 g solution Prepare according to package instructions.  Starting at 5:00 PM: Drink one 8 oz glass of mixture every 15 minutes until you finish half of the jug. Five hours prior to procedure, drink 8 oz glass of mixture every 15 minutes until it is all gone. Make sure you do not drink anything 4 hours prior to your procedure. 11/06/19  Yes Vonda Antigua B, MD  rosuvastatin (CRESTOR) 5 MG tablet Take 1 tablet (5 mg total) by mouth at bedtime. 10/25/19  Yes Glean Hess, MD  sertraline (ZOLOFT) 50 MG tablet Take 50 mg by mouth daily.   Yes [provider]  Sod Picosulfate-Mag Ox-Cit Acd (CLENPIQ) 10-3.5-12 MG-GM -GM/160ML SOLN Take 1 bottle at 5 PM followed by five 8 oz cups of water and repeat 5 hours before procedure. 11/06/19  Yes Virgel Manifold, MD  traZODone (DESYREL) 50 MG tablet Take 50 mg by mouth at bedtime.   Yes [provider]  levocetirizine (XYZAL) 5 MG tablet Take 1qpm (Plz sched appt with new provider) 10/25/19   Glean Hess, MD    Allergies as of 11/06/2019 - Review Complete 11/06/2019  Allergen Reaction Noted  . Sulfa antibiotics Anaphylaxis 08/22/2012  . Doxycycline  11/06/2012  . Lisinopril  04/25/2017  . Cefzil [cefprozil] Rash 08/22/2012    Family History  Problem Relation Age of Onset  . Hyperlipidemia Mother   . Hypertension Mother   . Hyperlipidemia Father   . Hypertension Father   . Diabetes Father   .  Cancer Maternal Aunt 40       breast CA  . Breast cancer Cousin   . Breast cancer Other   . Breast cancer Other     Social History   Socioeconomic History  . Marital status: Single    Spouse name: Not on file  . Number of children: Not on file  . Years of education: Not on file  . Highest education level: Not on file  Occupational History  . Occupation: Occupational psychologist  Tobacco Use  . Smoking status: Never Smoker  . Smokeless tobacco: Never Used  Vaping Use  . Vaping Use: Never used  Substance and Sexual Activity  . Alcohol use: Not Currently    Alcohol/week: 0.0 standard  drinks    Comment: rarely  . Drug use: No  . Sexual activity: Not Currently    Partners: Male  Other Topics Concern  . Not on file  Social History Narrative  . Not on file   Social Determinants of Health   Financial Resource Strain:   . Difficulty of Paying Living Expenses:   Food Insecurity:   . Worried About Charity fundraiser in the Last Year:   . Arboriculturist in the Last Year:   Transportation Needs:   . Film/video editor (Medical):   Marland Kitchen Lack of Transportation (Non-Medical):   Physical Activity:   . Days of Exercise per Week:   . Minutes of Exercise per Session:   Stress:   . Feeling of Stress :   Social Connections:   . Frequency of Communication with Friends and Family:   . Frequency of Social Gatherings with Friends and Family:   . Attends Religious Services:   . Active Member of Clubs or Organizations:   . Attends Archivist Meetings:   Marland Kitchen Marital Status:   Intimate Partner Violence:   . Fear of Current or Ex-Partner:   . Emotionally Abused:   Marland Kitchen Physically Abused:   . Sexually Abused:     Review of Systems: See HPI, otherwise negative ROS  Physical Exam: BP 124/72   Pulse 72   Temp (!) 96.8 F (36 C) (Temporal)   Resp 18   Ht 5\' 2"  (1.575 m)   Wt 98.9 kg   LMP  (LMP Unknown) Comment: Urine Preg. Negative  SpO2 97%   BMI 39.87 kg/m  General:   Alert,  pleasant and cooperative in NAD Head:  Normocephalic and atraumatic. Neck:  Supple; no masses or thyromegaly. Lungs:  Clear throughout to auscultation, normal respiratory effort.    Heart:  +S1, +S2, Regular rate and rhythm, No edema. Abdomen:  Soft, nontender and nondistended. Normal bowel sounds, without guarding, and without rebound.   Neurologic:  Alert and  oriented x4;  grossly normal neurologically.  Impression/Plan: Samantha Moses is here for a colonoscopy to be performed for average risk screening.  Risks, benefits, limitations, and alternatives regarding  colonoscopy have  been reviewed with the patient.  Questions have been answered.  All parties agreeable.   Virgel Manifold, MD  11/27/2019, 11:30 AM

## 2019-11-27 NOTE — Anesthesia Postprocedure Evaluation (Signed)
Anesthesia Post Note  Patient: Samantha Moses  Procedure(s) Performed: COLONOSCOPY WITH PROPOFOL (N/A )  Patient location during evaluation: Endoscopy Anesthesia Type: General Level of consciousness: awake and alert Pain management: pain level controlled Vital Signs Assessment: post-procedure vital signs reviewed and stable Respiratory status: spontaneous breathing, nonlabored ventilation, respiratory function stable and patient connected to nasal cannula oxygen Cardiovascular status: blood pressure returned to baseline and stable Postop Assessment: no apparent nausea or vomiting Anesthetic complications: no   No complications documented.   Last Vitals:  Vitals:   11/27/19 1228 11/27/19 1238  BP: 119/74 133/83  Pulse: 75 77  Resp: 14 20  Temp:    SpO2: 98% 98%    Last Pain:  Vitals:   11/27/19 1238  TempSrc:   PainSc: 0-No pain                 Precious Haws Murlin Schrieber

## 2019-11-27 NOTE — Transfer of Care (Signed)
Immediate Anesthesia Transfer of Care Note  Patient: Samantha Moses  Procedure(s) Performed: COLONOSCOPY WITH PROPOFOL (N/A )  Patient Location: PACU  Anesthesia Type:General  Level of Consciousness: awake and alert   Airway & Oxygen Therapy: Patient Spontanous Breathing and Patient connected to nasal cannula oxygen  Post-op Assessment: Report given to RN and Post -op Vital signs reviewed and stable  Post vital signs: Reviewed and stable  Last Vitals:  Vitals Value Taken Time  BP    Temp    Pulse    Resp    SpO2      Last Pain:  Vitals:   11/27/19 1038  TempSrc: Temporal  PainSc: 0-No pain         Complications: No complications documented.

## 2019-11-28 ENCOUNTER — Encounter: Payer: Self-pay | Admitting: Gastroenterology

## 2019-11-28 LAB — SURGICAL PATHOLOGY

## 2019-11-29 ENCOUNTER — Encounter: Payer: Self-pay | Admitting: Gastroenterology

## 2020-01-06 ENCOUNTER — Other Ambulatory Visit: Payer: Self-pay | Admitting: Family Medicine

## 2020-01-28 ENCOUNTER — Other Ambulatory Visit: Payer: Self-pay | Admitting: Internal Medicine

## 2020-02-10 ENCOUNTER — Other Ambulatory Visit: Payer: Self-pay | Admitting: Internal Medicine

## 2020-02-10 DIAGNOSIS — I10 Essential (primary) hypertension: Secondary | ICD-10-CM

## 2020-02-10 DIAGNOSIS — E785 Hyperlipidemia, unspecified: Secondary | ICD-10-CM

## 2020-02-10 DIAGNOSIS — E118 Type 2 diabetes mellitus with unspecified complications: Secondary | ICD-10-CM

## 2020-02-10 DIAGNOSIS — E1169 Type 2 diabetes mellitus with other specified complication: Secondary | ICD-10-CM

## 2020-02-11 ENCOUNTER — Other Ambulatory Visit: Payer: Self-pay | Admitting: Internal Medicine

## 2020-02-11 ENCOUNTER — Encounter: Payer: Self-pay | Admitting: Internal Medicine

## 2020-02-11 DIAGNOSIS — E1169 Type 2 diabetes mellitus with other specified complication: Secondary | ICD-10-CM

## 2020-02-11 DIAGNOSIS — J3089 Other allergic rhinitis: Secondary | ICD-10-CM

## 2020-02-11 DIAGNOSIS — I10 Essential (primary) hypertension: Secondary | ICD-10-CM

## 2020-02-11 DIAGNOSIS — E118 Type 2 diabetes mellitus with unspecified complications: Secondary | ICD-10-CM

## 2020-02-11 DIAGNOSIS — E785 Hyperlipidemia, unspecified: Secondary | ICD-10-CM

## 2020-02-11 MED ORDER — AMLODIPINE BESYLATE 10 MG PO TABS: 10.0000 mg | ORAL_TABLET | Freq: Every day | ORAL | 1 refills | Status: DC

## 2020-02-11 MED ORDER — METFORMIN HCL 1000 MG PO TABS: 1000.0000 mg | ORAL_TABLET | Freq: Two times a day (BID) | ORAL | 1 refills | Status: DC

## 2020-02-11 MED ORDER — LEVOCETIRIZINE DIHYDROCHLORIDE 5 MG PO TABS: ORAL_TABLET | ORAL | 0 refills | Status: DC

## 2020-02-11 MED ORDER — ROSUVASTATIN CALCIUM 5 MG PO TABS: 5.0000 mg | ORAL_TABLET | Freq: Every day | ORAL | 1 refills | Status: DC

## 2020-02-11 MED ORDER — LOSARTAN POTASSIUM 100 MG PO TABS: ORAL_TABLET | ORAL | 1 refills | Status: DC

## 2020-02-12 ENCOUNTER — Other Ambulatory Visit: Payer: Self-pay

## 2020-02-12 MED ORDER — MONTELUKAST SODIUM 10 MG PO TABS: ORAL_TABLET | ORAL | 0 refills | Status: DC

## 2020-03-04 ENCOUNTER — Ambulatory Visit (INDEPENDENT_AMBULATORY_CARE_PROVIDER_SITE_OTHER): Payer: 59 | Admitting: Internal Medicine

## 2020-03-04 ENCOUNTER — Other Ambulatory Visit: Payer: Self-pay

## 2020-03-04 ENCOUNTER — Encounter: Payer: Self-pay | Admitting: Internal Medicine

## 2020-03-04 VITALS — BP 136/85 | HR 78 | Temp 97.7°F | Ht 62.0 in | Wt 217.0 lb

## 2020-03-04 DIAGNOSIS — E118 Type 2 diabetes mellitus with unspecified complications: Secondary | ICD-10-CM

## 2020-03-04 DIAGNOSIS — F4323 Adjustment disorder with mixed anxiety and depressed mood: Secondary | ICD-10-CM

## 2020-03-04 DIAGNOSIS — R1310 Dysphagia, unspecified: Secondary | ICD-10-CM | POA: Diagnosis not present

## 2020-03-04 DIAGNOSIS — I10 Essential (primary) hypertension: Secondary | ICD-10-CM | POA: Diagnosis not present

## 2020-03-04 DIAGNOSIS — Z1231 Encounter for screening mammogram for malignant neoplasm of breast: Secondary | ICD-10-CM

## 2020-03-04 DIAGNOSIS — Z0000 Encounter for general adult medical examination without abnormal findings: Secondary | ICD-10-CM | POA: Diagnosis not present

## 2020-03-04 DIAGNOSIS — E785 Hyperlipidemia, unspecified: Secondary | ICD-10-CM

## 2020-03-04 DIAGNOSIS — E1169 Type 2 diabetes mellitus with other specified complication: Secondary | ICD-10-CM

## 2020-03-04 LAB — POCT URINALYSIS DIPSTICK
Bilirubin, UA: NEGATIVE
Blood, UA: NEGATIVE
Glucose, UA: NEGATIVE
Ketones, UA: NEGATIVE
Leukocytes, UA: NEGATIVE
Nitrite, UA: NEGATIVE
Protein, UA: NEGATIVE
Spec Grav, UA: <=1.005 — AB (ref 1.010–1.025)
Urobilinogen, UA: 0.2 E.U./dL
pH, UA: 6.5 (ref 5.0–8.0)
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
## 2020-03-05 LAB — COMPREHENSIVE METABOLIC PANEL
ALT: 49 IU/L — ABNORMAL HIGH (ref 0–32)
AST: 26 IU/L (ref 0–40)
Albumin/Globulin Ratio: 2.1 (ref 1.2–2.2)
Albumin: 4.8 g/dL (ref 3.8–4.8)
Alkaline Phosphatase: 77 IU/L (ref 44–121)
BUN/Creatinine Ratio: 17 (ref 9–23)
BUN: 16 mg/dL (ref 6–24)
Bilirubin Total: 0.3 mg/dL (ref 0.0–1.2)
CO2: 23 mmol/L (ref 20–29)
Calcium: 10 mg/dL (ref 8.7–10.2)
Chloride: 99 mmol/L (ref 96–106)
Creatinine, Ser: 0.93 mg/dL (ref 0.57–1.00)
GFR calc Af Amer: 83 mL/min/{1.73_m2} (ref >59)
GFR calc non Af Amer: 72 mL/min/{1.73_m2} (ref >59)
Globulin, Total: 2.3 g/dL (ref 1.5–4.5)
Glucose: 125 mg/dL — ABNORMAL HIGH (ref 65–99)
Potassium: 4.6 mmol/L (ref 3.5–5.2)
Sodium: 139 mmol/L (ref 134–144)
Total Protein: 7.1 g/dL (ref 6.0–8.5)

## 2020-03-05 LAB — CBC WITH DIFFERENTIAL/PLATELET
Basophils Absolute: 0.1 10*3/uL (ref 0.0–0.2)
Basos: 1 %
EOS (ABSOLUTE): 1.3 10*3/uL — ABNORMAL HIGH (ref 0.0–0.4)
Eos: 15 %
Hematocrit: 45.7 % (ref 34.0–46.6)
Hemoglobin: 15.1 g/dL (ref 11.1–15.9)
Immature Grans (Abs): 0 10*3/uL (ref 0.0–0.1)
Immature Granulocytes: 0 %
Lymphocytes Absolute: 2.9 10*3/uL (ref 0.7–3.1)
Lymphs: 35 %
MCH: 29.7 pg (ref 26.6–33.0)
MCHC: 33 g/dL (ref 31.5–35.7)
MCV: 90 fL (ref 79–97)
Monocytes Absolute: 0.4 10*3/uL (ref 0.1–0.9)
Monocytes: 5 %
Neutrophils Absolute: 3.7 10*3/uL (ref 1.4–7.0)
Neutrophils: 44 %
Platelets: 244 10*3/uL (ref 150–450)
RBC: 5.08 x10E6/uL (ref 3.77–5.28)
RDW: 12.5 % (ref 11.7–15.4)
WBC: 8.4 10*3/uL (ref 3.4–10.8)

## 2020-03-05 LAB — LIPID PANEL
Chol/HDL Ratio: 3.5 ratio (ref 0.0–4.4)
Cholesterol, Total: 171 mg/dL (ref 100–199)
HDL: 49 mg/dL (ref >39)
LDL Chol Calc (NIH): 93 mg/dL (ref 0–99)
Triglycerides: 168 mg/dL — ABNORMAL HIGH (ref 0–149)
VLDL Cholesterol Cal: 29 mg/dL (ref 5–40)

## 2020-03-05 LAB — HEMOGLOBIN A1C
Est. average glucose Bld gHb Est-mCnc: 174 mg/dL
Hgb A1c MFr Bld: 7.7 % — ABNORMAL HIGH (ref 4.8–5.6)

## 2020-03-05 LAB — TSH: TSH: 2.9 u[IU]/mL (ref 0.450–4.500)

## 2020-03-10 ENCOUNTER — Other Ambulatory Visit: Payer: Self-pay

## 2020-03-10 ENCOUNTER — Ambulatory Visit
Admission: RE | Admit: 2020-03-10 | Discharge: 2020-03-10 | Disposition: A | Discharge status: Home / Self Care | Payer: 59 | Source: Ambulatory Visit | Attending: Internal Medicine | Admitting: Internal Medicine

## 2020-03-10 DIAGNOSIS — R1310 Dysphagia, unspecified: Secondary | ICD-10-CM | POA: Insufficient documentation

## 2020-04-06 ENCOUNTER — Other Ambulatory Visit: Payer: Self-pay | Admitting: Internal Medicine

## 2020-04-06 ENCOUNTER — Other Ambulatory Visit: Payer: Self-pay | Admitting: Family Medicine

## 2020-04-06 DIAGNOSIS — Z1231 Encounter for screening mammogram for malignant neoplasm of breast: Secondary | ICD-10-CM

## 2020-04-06 NOTE — Telephone Encounter (Signed)
Please see message and advise.  Thank you. ° °

## 2020-04-21 ENCOUNTER — Telehealth: Payer: Self-pay

## 2020-04-21 NOTE — Telephone Encounter (Signed)
Called and left patient and VM informing her to call and schedule her mammogram as soon as possible because her order will be expiring soon.

## 2020-05-06 ENCOUNTER — Ambulatory Visit
Admission: RE | Admit: 2020-05-06 | Discharge: 2020-05-06 | Disposition: A | Discharge status: Home / Self Care | Payer: 59 | Source: Ambulatory Visit | Attending: Internal Medicine | Admitting: Internal Medicine

## 2020-05-06 ENCOUNTER — Other Ambulatory Visit: Payer: Self-pay

## 2020-05-06 DIAGNOSIS — Z1231 Encounter for screening mammogram for malignant neoplasm of breast: Secondary | ICD-10-CM | POA: Insufficient documentation

## 2020-06-05 ENCOUNTER — Other Ambulatory Visit: Payer: Self-pay | Admitting: Internal Medicine

## 2020-06-05 DIAGNOSIS — E785 Hyperlipidemia, unspecified: Secondary | ICD-10-CM

## 2020-06-05 DIAGNOSIS — E118 Type 2 diabetes mellitus with unspecified complications: Secondary | ICD-10-CM

## 2020-06-05 DIAGNOSIS — E1169 Type 2 diabetes mellitus with other specified complication: Secondary | ICD-10-CM

## 2020-06-05 DIAGNOSIS — I10 Essential (primary) hypertension: Secondary | ICD-10-CM

## 2020-07-07 ENCOUNTER — Other Ambulatory Visit: Payer: Self-pay

## 2020-07-07 ENCOUNTER — Ambulatory Visit: Payer: 59 | Admitting: Internal Medicine

## 2020-07-07 ENCOUNTER — Encounter: Payer: Self-pay | Admitting: Internal Medicine

## 2020-07-07 VITALS — BP 128/85 | HR 83 | Temp 97.5°F | Ht 62.0 in | Wt 211.0 lb

## 2020-07-07 DIAGNOSIS — I10 Essential (primary) hypertension: Secondary | ICD-10-CM | POA: Diagnosis not present

## 2020-07-07 DIAGNOSIS — Z9989 Dependence on other enabling machines and devices: Secondary | ICD-10-CM | POA: Insufficient documentation

## 2020-07-07 DIAGNOSIS — J3089 Other allergic rhinitis: Secondary | ICD-10-CM | POA: Insufficient documentation

## 2020-07-07 DIAGNOSIS — S86912A Strain of unspecified muscle(s) and tendon(s) at lower leg level, left leg, initial encounter: Secondary | ICD-10-CM | POA: Diagnosis not present

## 2020-07-07 DIAGNOSIS — G4733 Obstructive sleep apnea (adult) (pediatric): Secondary | ICD-10-CM | POA: Insufficient documentation

## 2020-07-07 DIAGNOSIS — E118 Type 2 diabetes mellitus with unspecified complications: Secondary | ICD-10-CM

## 2020-07-07 LAB — POCT GLYCOSYLATED HEMOGLOBIN (HGB A1C): Hemoglobin A1C: 7.1 % — AB (ref 4.0–5.6)

## 2020-07-07 NOTE — Patient Instructions (Addendum)
Resume flonase nasal spray.  Take Advil 2-3 times per day for knee pain.  If no improvement in 1 week, call to see Dr. Zigmund Daniel.

## 2020-07-07 NOTE — Progress Notes (Signed)
Date:  07/07/2020   Name:  Samantha Moses   DOB:  1971-01-01   MRN:  [id number]   Chief Complaint: Diabetes  Diabetes She presents for her follow-up diabetic visit. She has type 2 diabetes mellitus. Her disease course has been stable. Pertinent negatives for hypoglycemia include no dizziness, headaches or tremors. Pertinent negatives for diabetes include no chest pain, no fatigue, no polydipsia and no polyuria. Current diabetic treatment includes oral agent (monotherapy). She is compliant with treatment all of the time. Her weight is stable. She is following a generally healthy diet. An ACE inhibitor/angiotensin II receptor blocker is being taken. Eye exam is current.  Hypertension This is a chronic problem. The problem is controlled. Pertinent negatives include no chest pain, headaches, palpitations or shortness of breath. Past treatments include calcium channel blockers and angiotensin blockers. The current treatment provides significant improvement. There are no compliance problems.   Sinus Problem Associated symptoms include congestion. Pertinent negatives include no coughing, headaches, shortness of breath, sinus pressure or sore throat.  Knee Pain  The incident occurred more than 1 week ago. The incident occurred at home. The injury mechanism was a twisting injury. The pain is present in the left knee. The quality of the pain is described as aching. The pain is mild. The pain has been fluctuating since onset. Pertinent negatives include no muscle weakness or numbness. She reports no foreign bodies present. The symptoms are aggravated by weight bearing. She has tried nothing for the symptoms.    Lab Results  Component Value Date   CREATININE 0.93 03/04/2020   BUN 16 03/04/2020   NA 139 03/04/2020   K 4.6 03/04/2020   CL 99 03/04/2020   CO2 23 03/04/2020   Lab Results  Component Value Date   CHOL 171 03/04/2020   HDL 49 03/04/2020   LDLCALC 93 03/04/2020   LDLDIRECT 151.0  06/11/2018   TRIG 168 (H) 03/04/2020   CHOLHDL 3.5 03/04/2020   Lab Results  Component Value Date   TSH 2.900 03/04/2020   Lab Results  Component Value Date   HGBA1C 7.1 (A) 07/07/2020   Lab Results  Component Value Date   WBC 8.4 03/04/2020   HGB 15.1 03/04/2020   HCT 45.7 03/04/2020   MCV 90 03/04/2020   PLT 244 03/04/2020   Lab Results  Component Value Date   ALT 49 (H) 03/04/2020   AST 26 03/04/2020   ALKPHOS 77 03/04/2020   BILITOT 0.3 03/04/2020     Review of Systems  Constitutional: Negative for appetite change, fatigue, fever and unexpected weight change.  HENT: Positive for congestion and postnasal drip. Negative for sinus pressure, sore throat, tinnitus and trouble swallowing.   Eyes: Negative for visual disturbance.  Respiratory: Negative for cough, chest tightness and shortness of breath.   Cardiovascular: Negative for chest pain, palpitations and leg swelling.  Gastrointestinal: Negative for abdominal pain.  Endocrine: Negative for polydipsia and polyuria.  Genitourinary: Negative for dysuria and hematuria.  Musculoskeletal: Positive for arthralgias and joint swelling.  Neurological: Negative for dizziness, tremors, numbness and headaches.  Psychiatric/Behavioral: Negative for dysphoric mood.    Patient Active Problem List   Diagnosis Date Noted  . Encounter for screening colonoscopy   . Polyp of colon   . Allergic rhinitis 08/22/2012  . Hyperlipidemia associated with type 2 diabetes mellitus ([place])   . Type II diabetes mellitus with complication ([place])   . Essential hypertension   . Adjustment reaction with anxiety and depression   .  ADD (attention deficit disorder)     Allergies  Allergen Reactions  . Sulfa Antibiotics Anaphylaxis  . Doxycycline     rash  . Lisinopril     cough  . Cefzil [Cefprozil] Rash    Past Surgical History:  Procedure Laterality Date  . COLONOSCOPY WITH PROPOFOL N/A 11/27/2019   Procedure: COLONOSCOPY WITH  PROPOFOL;  Surgeon: Virgel Manifold, MD;  Location: [facility] ENDOSCOPY;  Service: Endoscopy;  Laterality: N/A;  . none      Social History   Tobacco Use  . Smoking status: Never Smoker  . Smokeless tobacco: Never Used  Vaping Use  . Vaping Use: Never used  Substance Use Topics  . Alcohol use: Not Currently    Alcohol/week: 0.0 standard drinks    Comment: rarely  . Drug use: No     Medication list has been reviewed and updated.  Current Meds  Medication Sig  . albuterol (PROVENTIL HFA;VENTOLIN HFA) 108 (90 BASE) MCG/ACT inhaler Inhale 2 puffs into the lungs every 4 (four) hours as needed for wheezing.   Marland Kitchen amLODipine (NORVASC) 10 MG tablet TAKE 1 TABLET(10 MG) BY MOUTH DAILY  . amphetamine-dextroamphetamine (ADDERALL XR) 20 MG 24 hr capsule Take 20 mg by mouth every morning.  Marland Kitchen glucose blood (TRUE METRIX BLOOD GLUCOSE TEST) test strip Dx E11.9  . levocetirizine (XYZAL) 5 MG tablet TAKE 1 TABLET BY MOUTH EVERY EVENING  . losartan (COZAAR) 100 MG tablet TAKE 1 TABLET BY MOUTH EVERY DAY  . metFORMIN (GLUCOPHAGE) 1000 MG tablet TAKE 1 TABLET(1000 MG) BY MOUTH TWICE DAILY WITH A MEAL  . montelukast (SINGULAIR) 10 MG tablet TAKE 1 TABLET BY MOUTH EVERY DAY  . Multiple Vitamin (MULTIVITAMIN) tablet Take 1 tablet by mouth daily.  . Omega-3 Fatty Acids (FISH OIL TRIPLE STRENGTH) 1400 MG CAPS Take by mouth 2 (two) times daily.  . rosuvastatin (CRESTOR) 5 MG tablet TAKE 1 TABLET(5 MG) BY MOUTH AT BEDTIME  . sertraline (ZOLOFT) 50 MG tablet Take 50 mg by mouth daily.    PHQ 2/9 Scores 07/07/2020 03/04/2020 10/25/2019 06/11/2018  PHQ - 2 Score 0 0 0 3  PHQ- 9 Score 0 0 1 15    GAD 7 : Generalized Anxiety Score 07/07/2020 03/04/2020 10/25/2019 06/11/2018  Nervous, Anxious, on Edge 0 0 1 3  Control/stop worrying 0 0 1 2  Worry too much - different things 0 0 1 2  Trouble relaxing 0 0 0 1  Restless 0 0 0 1  Easily annoyed or irritable 0 0 0 3  Afraid - awful might happen 0 0 0 0  Total GAD 7  Score 0 0 3 12  Anxiety Difficulty - Not difficult at all Not difficult at all Very difficult    BP Readings from Last 3 Encounters:  07/07/20 128/85  03/04/20 136/85  11/27/19 133/83    Physical Exam Vitals and nursing note reviewed.  Constitutional:      General: She is not in acute distress.    Appearance: She is well-developed.  HENT:     Head: Normocephalic and atraumatic.     Right Ear: Tympanic membrane and ear canal normal.     Left Ear: Tympanic membrane and ear canal normal.     Nose:     Right Sinus: No maxillary sinus tenderness or frontal sinus tenderness.     Left Sinus: No maxillary sinus tenderness or frontal sinus tenderness.     Mouth/Throat:     Pharynx: No pharyngeal swelling, oropharyngeal exudate  or posterior oropharyngeal erythema.  Cardiovascular:     Rate and Rhythm: Normal rate and regular rhythm.     Pulses: Normal pulses.     Heart sounds: No murmur heard.   Pulmonary:     Effort: Pulmonary effort is normal. No respiratory distress.     Breath sounds: Examination of the left-upper field reveals wheezing. Wheezing present.     Comments: Few expiratory wheezes noted Musculoskeletal:     Cervical back: Normal range of motion.     Right knee: Normal.     Left knee: Swelling and effusion present. No erythema, bony tenderness or crepitus. Normal range of motion. No tenderness.  Lymphadenopathy:     Cervical: No cervical adenopathy.  Skin:    General: Skin is warm and dry.     Findings: No rash.  Neurological:     Mental Status: She is alert and oriented to person, place, and time.  Psychiatric:        Mood and Affect: Mood normal.        Behavior: Behavior normal.     Wt Readings from Last 3 Encounters:  07/07/20 211 lb (95.7 kg)  03/04/20 217 lb (98.4 kg)  11/27/19 218 lb (98.9 kg)    BP 128/85   Pulse 83   Temp (!) 97.5 F (36.4 C) (Oral)   Ht 5\' 2"  (1.575 m)   Wt 211 lb (95.7 kg)   LMP  (LMP Unknown) Comment: Urine Preg. Negative   SpO2 98%   BMI 38.59 kg/m   Assessment and Plan: 1. Type II diabetes mellitus with complication (HCC) Clinically stable by exam and report without s/s of hypoglycemia. A1C improved. DM complicated by HTN. Tolerating medications well without side effects or other concerns. - POCT HgB A1C = 7.1  2. Essential hypertension Clinically stable exam with well controlled BP. Tolerating medications without side effects at this time. Pt to continue current regimen and low sodium diet; benefits of regular exercise as able discussed.  3. Environmental and seasonal allergies Resume Flonase; continue zyrtec and Singulair Use albuterol bid for wheezing Call if sx of sinusitis occur  4. Knee strain, left, initial encounter Advil tid, avoid excessive strain If no improvement in a week, call to see Dr. Zigmund Daniel  5. OSA on CPAP Not on CPap due to loss of machine Pt has not noticed much difference without treatment - will need a new sleep study if she wants to resume therapy   Partially dictated using Editor, commissioning. Any errors are unintentional.  Halina Maidens, MD [facility]  07/07/2020

## 2020-10-06 ENCOUNTER — Other Ambulatory Visit: Payer: Self-pay | Admitting: Internal Medicine

## 2020-10-21 ENCOUNTER — Encounter: Payer: Self-pay | Admitting: Internal Medicine

## 2020-10-21 ENCOUNTER — Other Ambulatory Visit: Payer: Self-pay

## 2020-10-21 ENCOUNTER — Ambulatory Visit (INDEPENDENT_AMBULATORY_CARE_PROVIDER_SITE_OTHER): Payer: 59 | Admitting: Internal Medicine

## 2020-10-21 VITALS — BP 126/78 | HR 84 | Temp 98.4°F | Ht 62.0 in | Wt 221.0 lb

## 2020-10-21 DIAGNOSIS — E118 Type 2 diabetes mellitus with unspecified complications: Secondary | ICD-10-CM | POA: Diagnosis not present

## 2020-10-21 DIAGNOSIS — E785 Hyperlipidemia, unspecified: Secondary | ICD-10-CM | POA: Diagnosis not present

## 2020-10-21 DIAGNOSIS — E1169 Type 2 diabetes mellitus with other specified complication: Secondary | ICD-10-CM | POA: Diagnosis not present

## 2020-10-21 DIAGNOSIS — I10 Essential (primary) hypertension: Secondary | ICD-10-CM

## 2020-10-21 LAB — POCT GLYCOSYLATED HEMOGLOBIN (HGB A1C): Hemoglobin A1C: 8.1 % — AB (ref 4.0–5.6)

## 2020-10-21 MED ORDER — RYBELSUS 3 MG PO TABS: 3.0000 mg | ORAL_TABLET | Freq: Every day | ORAL | 0 refills | Status: AC

## 2020-10-21 NOTE — Progress Notes (Signed)
Date:  10/21/2020   Name:  Samantha Moses   DOB:  Sep 09, 1970   MRN:  [id number]   Chief Complaint: Diabetes and Hypertension  Diabetes She presents for her follow-up diabetic visit. She has type 2 diabetes mellitus. Her disease course has been stable. Pertinent negatives for hypoglycemia include no headaches or tremors. Pertinent negatives for diabetes include no chest pain, no fatigue, no polydipsia and no polyuria. Current diabetic treatment includes oral agent (monotherapy). She is compliant with treatment all of the time. An ACE inhibitor/angiotensin II receptor blocker is being taken. Eye exam is current (but due this month).  Hypertension This is a chronic problem. The problem is controlled. Pertinent negatives include no chest pain, headaches, palpitations or shortness of breath. Past treatments include calcium channel blockers and angiotensin blockers. The current treatment provides significant improvement.   Lab Results  Component Value Date   CREATININE 0.93 03/04/2020   BUN 16 03/04/2020   NA 139 03/04/2020   K 4.6 03/04/2020   CL 99 03/04/2020   CO2 23 03/04/2020   Lab Results  Component Value Date   CHOL 171 03/04/2020   HDL 49 03/04/2020   LDLCALC 93 03/04/2020   LDLDIRECT 151.0 06/11/2018   TRIG 168 (H) 03/04/2020   CHOLHDL 3.5 03/04/2020   Lab Results  Component Value Date   TSH 2.900 03/04/2020   Lab Results  Component Value Date   HGBA1C 8.1 (A) 10/21/2020   Lab Results  Component Value Date   WBC 8.4 03/04/2020   HGB 15.1 03/04/2020   HCT 45.7 03/04/2020   MCV 90 03/04/2020   PLT 244 03/04/2020   Lab Results  Component Value Date   ALT 49 (H) 03/04/2020   AST 26 03/04/2020   ALKPHOS 77 03/04/2020   BILITOT 0.3 03/04/2020     Review of Systems  Constitutional:  Negative for appetite change, fatigue, fever and unexpected weight change.  HENT:  Negative for tinnitus and trouble swallowing.   Eyes:  Negative for visual disturbance.   Respiratory:  Negative for cough, chest tightness and shortness of breath.   Cardiovascular:  Negative for chest pain, palpitations and leg swelling.  Gastrointestinal:  Negative for abdominal pain.  Endocrine: Negative for polydipsia and polyuria.  Genitourinary:  Negative for dysuria and hematuria.  Musculoskeletal:  Negative for arthralgias.  Neurological:  Negative for tremors, numbness and headaches.  Psychiatric/Behavioral:  Negative for dysphoric mood.    Patient Active Problem List   Diagnosis Date Noted   OSA on CPAP 07/07/2020   Environmental and seasonal allergies 07/07/2020   Encounter for screening colonoscopy    Polyp of colon    Allergic rhinitis 08/22/2012   Hyperlipidemia associated with type 2 diabetes mellitus ([place])    Type II diabetes mellitus with complication ([place])    Essential hypertension    Adjustment reaction with anxiety and depression    ADD (attention deficit disorder)     Allergies  Allergen Reactions   Sulfa Antibiotics Anaphylaxis   Doxycycline     rash   Lisinopril     cough   Cefzil [Cefprozil] Rash    Past Surgical History:  Procedure Laterality Date   COLONOSCOPY WITH PROPOFOL N/A 11/27/2019   Procedure: COLONOSCOPY WITH PROPOFOL;  Surgeon: Virgel Manifold, MD;  Location: [facility] ENDOSCOPY;  Service: Endoscopy;  Laterality: N/A;   none      Social History   Tobacco Use   Smoking status: Never   Smokeless tobacco: Never  Vaping  Use   Vaping Use: Never used  Substance Use Topics   Alcohol use: Not Currently    Alcohol/week: 0.0 standard drinks    Comment: rarely   Drug use: No     Medication list has been reviewed and updated.  Current Meds  Medication Sig   albuterol (PROVENTIL HFA;VENTOLIN HFA) 108 (90 BASE) MCG/ACT inhaler Inhale 2 puffs into the lungs every 4 (four) hours as needed for wheezing.    amLODipine (NORVASC) 10 MG tablet TAKE 1 TABLET(10 MG) BY MOUTH DAILY   amphetamine-dextroamphetamine (ADDERALL XR) 20  MG 24 hr capsule Take 20 mg by mouth every morning.   FLUTICASONE PROPIONATE, NASAL, NA    glucose blood (TRUE METRIX BLOOD GLUCOSE TEST) test strip Dx E11.9   levocetirizine (XYZAL) 5 MG tablet TAKE 1 TABLET BY MOUTH EVERY EVENING   losartan (COZAAR) 100 MG tablet TAKE 1 TABLET BY MOUTH EVERY DAY   metFORMIN (GLUCOPHAGE) 1000 MG tablet TAKE 1 TABLET(1000 MG) BY MOUTH TWICE DAILY WITH A MEAL   montelukast (SINGULAIR) 10 MG tablet TAKE 1 TABLET BY MOUTH EVERY DAY   Multiple Vitamin (MULTIVITAMIN) tablet Take 1 tablet by mouth daily.   Omega-3 Fatty Acids (FISH OIL TRIPLE STRENGTH) 1400 MG CAPS Take by mouth 2 (two) times daily.   rosuvastatin (CRESTOR) 5 MG tablet TAKE 1 TABLET(5 MG) BY MOUTH AT BEDTIME   Semaglutide (RYBELSUS) 3 MG TABS Take 3 mg by mouth daily.   sertraline (ZOLOFT) 50 MG tablet Take 50 mg by mouth daily.    PHQ 2/9 Scores 10/21/2020 07/07/2020 03/04/2020 10/25/2019  PHQ - 2 Score 0 0 0 0  PHQ- 9 Score 3 0 0 1    GAD 7 : Generalized Anxiety Score 10/21/2020 07/07/2020 03/04/2020 10/25/2019  Nervous, Anxious, on Edge 0 0 0 1  Control/stop worrying 1 0 0 1  Worry too much - different things 0 0 0 1  Trouble relaxing 0 0 0 0  Restless 0 0 0 0  Easily annoyed or irritable 2 0 0 0  Afraid - awful might happen 0 0 0 0  Total GAD 7 Score 3 0 0 3  Anxiety Difficulty Not difficult at all - Not difficult at all Not difficult at all    BP Readings from Last 3 Encounters:  10/21/20 126/78  07/07/20 128/85  03/04/20 136/85    Physical Exam Vitals and nursing note reviewed.  Constitutional:      General: She is not in acute distress.    Appearance: She is well-developed.  HENT:     Head: Normocephalic and atraumatic.  Cardiovascular:     Rate and Rhythm: Normal rate and regular rhythm.     Pulses: Normal pulses.     Heart sounds: No murmur heard. Pulmonary:     Effort: Pulmonary effort is normal. No respiratory distress.     Breath sounds: No wheezing or rhonchi.   Musculoskeletal:     Cervical back: Normal range of motion.     Right lower leg: No edema.     Left lower leg: No edema.  Lymphadenopathy:     Cervical: No cervical adenopathy.  Skin:    General: Skin is warm and dry.     Findings: No rash.  Neurological:     General: No focal deficit present.     Mental Status: She is alert and oriented to person, place, and time.  Psychiatric:        Mood and Affect: Mood normal.  Behavior: Behavior normal.    Wt Readings from Last 3 Encounters:  10/21/20 221 lb (100.2 kg)  07/07/20 211 lb (95.7 kg)  03/04/20 217 lb (98.4 kg)    BP 126/78 (BP Location: Right Arm, Patient Position: Sitting, Cuff Size: Large)   Pulse 84   Temp 98.4 F (36.9 C) (Oral)   Ht 5\' 2"  (1.575 m)   Wt 221 lb (100.2 kg)   LMP  (LMP Unknown) Comment: Urine Preg. Negative  SpO2 96%   BMI 40.42 kg/m   Assessment and Plan: 1. Type II diabetes mellitus with complication (HCC) BS is poorly controlled - contributing factors including stress eating and weight gain. Continue metformin; work on diet Will add Rybelsus 3 mg. If tolerated, call for increase to 7 mg after one month - POCT HgB A1C = 8.1 up from 7.1 - Semaglutide (RYBELSUS) 3 MG TABS; Take 3 mg by mouth daily.  Dispense: 30 tablet; Refill: 0  2. Essential hypertension Clinically stable exam with well controlled BP. Tolerating medications without side effects at this time. Pt to continue current regimen and low sodium diet; benefits of regular exercise as able discussed.  3. Hyperlipidemia associated with type 2 diabetes mellitus ([place]) Tolerating statin medication without side effects at this time LDL is at goal of < 70 on current dose Continue same therapy without change at this time.   Partially dictated using Editor, commissioning. Any errors are unintentional.  Halina Maidens, MD [facility]  10/21/2020

## 2020-11-11 LAB — HM DIABETES EYE EXAM

## 2020-11-12 ENCOUNTER — Other Ambulatory Visit: Payer: Self-pay

## 2020-11-12 ENCOUNTER — Encounter: Payer: Self-pay | Admitting: Internal Medicine

## 2020-11-12 MED ORDER — FREESTYLE LIBRE 2 SENSOR MISC: 3 refills | Status: DC

## 2020-11-12 MED ORDER — FREESTYLE LIBRE 14 DAY READER DEVI: 0 refills | Status: DC

## 2020-11-12 NOTE — Telephone Encounter (Signed)
Pt response.  KP

## 2020-11-13 ENCOUNTER — Other Ambulatory Visit: Payer: Self-pay

## 2020-11-13 ENCOUNTER — Telehealth: Payer: Self-pay

## 2020-11-13 ENCOUNTER — Encounter: Payer: Self-pay | Admitting: Internal Medicine

## 2020-11-13 MED ORDER — FREESTYLE LIBRE 2 READER DEVI: 0 refills | Status: DC

## 2020-11-13 NOTE — Telephone Encounter (Unsigned)
Copied from [place] [id number]. Topic: General - Other >> Nov 13, 2020  8:05 AM Valere Dross wrote: Reason for CRM: Wells Guiles from [facility] called in on behalf of the pt about a medication that was sent over. Wells Guiles stated it needs to be a 2day instead of a 14day, and if a new prescription could be sent over. Please advise.    Continuous Blood Gluc Receiver (FREESTYLE LIBRE 14 DAY READER) DEVI

## 2020-11-13 NOTE — Telephone Encounter (Signed)
Changed reader   KP

## 2020-11-19 ENCOUNTER — Other Ambulatory Visit: Payer: Self-pay | Admitting: Internal Medicine

## 2020-11-19 DIAGNOSIS — I10 Essential (primary) hypertension: Secondary | ICD-10-CM

## 2020-11-19 DIAGNOSIS — E118 Type 2 diabetes mellitus with unspecified complications: Secondary | ICD-10-CM

## 2020-11-19 DIAGNOSIS — E1169 Type 2 diabetes mellitus with other specified complication: Secondary | ICD-10-CM

## 2020-11-19 DIAGNOSIS — E785 Hyperlipidemia, unspecified: Secondary | ICD-10-CM

## 2020-11-19 NOTE — Telephone Encounter (Signed)
Requested Prescriptions  Pending Prescriptions Disp Refills  . metFORMIN (GLUCOPHAGE) 1000 MG tablet [Pharmacy Med Name: METFORMIN 1000MG TABLETS] 180 tablet 1    Sig: TAKE 1 TABLET(1000 MG) BY MOUTH TWICE DAILY WITH A MEAL     Endocrinology:  Diabetes - Biguanides Failed - 11/19/2020  6:30 AM      Failed - HBA1C is between 0 and 7.9 and within 180 days    Hemoglobin A1C  Date Value Ref Range Status  10/21/2020 8.1 (A) 4.0 - 5.6 % Final   HbA1c POC (<> result, manual entry)  Date Value Ref Range Status  06/11/2018 9.5 4.0 - 5.6 % Final   Hgb A1c MFr Bld  Date Value Ref Range Status  03/04/2020 7.7 (H) 4.8 - 5.6 % Final    Comment:             Prediabetes: 5.7 - 6.4          Diabetes: >6.4          Glycemic control for adults with diabetes: <7.0          Passed - Cr in normal range and within 360 days    Creatinine, Ser  Date Value Ref Range Status  03/04/2020 0.93 0.57 - 1.00 mg/dL Final   Creatinine,U  Date Value Ref Range Status  06/11/2018 68.1 mg/dL Final         Passed - eGFR in normal range and within 360 days    GFR calc Af Amer  Date Value Ref Range Status  03/04/2020 83 >59 mL/min/1.73 Final    Comment:    **In accordance with recommendations from the NKF-ASN Task force,**   [facility] is in the process of updating its eGFR calculation to the   2021 CKD-EPI creatinine equation that estimates kidney function   without a race variable.    GFR calc non Af Amer  Date Value Ref Range Status  03/04/2020 72 >59 mL/min/1.73 Final   GFR  Date Value Ref Range Status  06/11/2018 60.41 >60.00 mL/min Final         Passed - Valid encounter within last 6 months    Recent Outpatient Visits          4 weeks ago Type II diabetes mellitus with complication [facility])   [facility] Glean Hess, MD   4 months ago Type II diabetes mellitus with complication [facility])   [facility] Glean Hess, MD   8 months ago Annual physical exam   [facility] Glean Hess, MD   1 year ago Type II diabetes mellitus with complication [facility])   [facility] Glean Hess, MD      Future Appointments            In 3 months Army Melia Jesse Sans, MD [facility], [place]           . rosuvastatin (CRESTOR) 5 MG tablet [Pharmacy Med Name: ROSUVASTATIN 5MG TABLETS] 90 tablet 1    Sig: TAKE 1 TABLET(5 MG) BY MOUTH AT BEDTIME     Cardiovascular:  Antilipid - Statins Failed - 11/19/2020  6:30 AM      Failed - Triglycerides in normal range and within 360 days    Triglycerides  Date Value Ref Range Status  03/04/2020 168 (H) 0 - 149 mg/dL Final         Passed - Total Cholesterol in normal range and within 360 days    Cholesterol, Total  Date Value Ref Range Status  03/04/2020 171 100 - 199 mg/dL Final         Passed - LDL in normal range and within 360 days    LDL Chol Calc (NIH)  Date Value Ref Range Status  03/04/2020 93 0 - 99 mg/dL Final   Direct LDL  Date Value Ref Range Status  06/11/2018 151.0 mg/dL Final    Comment:    Optimal:  <100 mg/dLNear or Above Optimal:  100-129 mg/dLBorderline High:  130-159 mg/dLHigh:  160-189 mg/dLVery High:  >190 mg/dL         Passed - HDL in normal range and within 360 days    HDL  Date Value Ref Range Status  03/04/2020 49 >39 mg/dL Final         Passed - Patient is not pregnant      Passed - Valid encounter within last 12 months    Recent Outpatient Visits          4 weeks ago Type II diabetes mellitus with complication [facility])   [facility] Glean Hess, MD   4 months ago Type II diabetes mellitus with complication [facility])   [facility] Glean Hess, MD   8 months ago Annual physical exam   [facility] Glean Hess, MD   1 year ago Type II diabetes mellitus with complication [facility])   [facility] Glean Hess, MD      Future Appointments            In 3 months Army Melia Jesse Sans, MD [facility],  [facility]           . losartan (COZAAR) 100 MG tablet [Pharmacy Med Name: LOSARTAN 100MG TABLETS] 90 tablet 1    Sig: TAKE 1 TABLET BY MOUTH EVERY DAY     Cardiovascular:  Angiotensin Receptor Blockers Failed - 11/19/2020  6:30 AM      Failed - Cr in normal range and within 180 days    Creatinine, Ser  Date Value Ref Range Status  03/04/2020 0.93 0.57 - 1.00 mg/dL Final   Creatinine,U  Date Value Ref Range Status  06/11/2018 68.1 mg/dL Final         Failed - K in normal range and within 180 days    Potassium  Date Value Ref Range Status  03/04/2020 4.6 3.5 - 5.2 mmol/L Final         Passed - Patient is not pregnant      Passed - Last BP in normal range    BP Readings from Last 1 Encounters:  10/21/20 126/78         Passed - Valid encounter within last 6 months    Recent Outpatient Visits          4 weeks ago Type II diabetes mellitus with complication [facility])   [facility] Glean Hess, MD   4 months ago Type II diabetes mellitus with complication [facility])   [facility] Glean Hess, MD   8 months ago Annual physical exam   [facility] Glean Hess, MD   1 year ago Type II diabetes mellitus with complication [facility])   [facility] Glean Hess, MD      Future Appointments            In 3 months Army Melia Jesse Sans, MD [facility], [facility]

## 2020-12-02 ENCOUNTER — Encounter: Payer: Self-pay | Admitting: Internal Medicine

## 2021-01-04 ENCOUNTER — Other Ambulatory Visit: Payer: Self-pay

## 2021-01-04 ENCOUNTER — Emergency Department: Payer: 59

## 2021-01-04 ENCOUNTER — Emergency Department
Admission: EM | Admit: 2021-01-04 | Discharge: 2021-01-04 | Disposition: A | Discharge status: Home / Self Care | Payer: 59 | Attending: Emergency Medicine | Admitting: Emergency Medicine

## 2021-01-04 DIAGNOSIS — I10 Essential (primary) hypertension: Secondary | ICD-10-CM | POA: Diagnosis not present

## 2021-01-04 DIAGNOSIS — R002 Palpitations: Secondary | ICD-10-CM | POA: Diagnosis not present

## 2021-01-04 DIAGNOSIS — Z79899 Other long term (current) drug therapy: Secondary | ICD-10-CM | POA: Diagnosis not present

## 2021-01-04 DIAGNOSIS — R202 Paresthesia of skin: Secondary | ICD-10-CM | POA: Diagnosis not present

## 2021-01-04 DIAGNOSIS — M62838 Other muscle spasm: Secondary | ICD-10-CM

## 2021-01-04 DIAGNOSIS — Z7984 Long term (current) use of oral hypoglycemic drugs: Secondary | ICD-10-CM | POA: Insufficient documentation

## 2021-01-04 DIAGNOSIS — E119 Type 2 diabetes mellitus without complications: Secondary | ICD-10-CM | POA: Insufficient documentation

## 2021-01-04 DIAGNOSIS — M546 Pain in thoracic spine: Secondary | ICD-10-CM | POA: Insufficient documentation

## 2021-01-04 DIAGNOSIS — R0789 Other chest pain: Secondary | ICD-10-CM

## 2021-01-04 LAB — CBC
HCT: 41.9 % (ref 36.0–46.0)
Hemoglobin: 14.7 g/dL (ref 12.0–15.0)
MCH: 31 pg (ref 26.0–34.0)
MCHC: 35.1 g/dL (ref 30.0–36.0)
MCV: 88.4 fL (ref 80.0–100.0)
Platelets: 229 10*3/uL (ref 150–400)
RBC: 4.74 MIL/uL (ref 3.87–5.11)
RDW: 12.1 % (ref 11.5–15.5)
WBC: 8.1 10*3/uL (ref 4.0–10.5)
nRBC: 0 % (ref 0.0–0.2)

## 2021-01-04 LAB — BASIC METABOLIC PANEL
Anion gap: 10 (ref 5–15)
BUN: 23 mg/dL — ABNORMAL HIGH (ref 6–20)
CO2: 25 mmol/L (ref 22–32)
Calcium: 9.7 mg/dL (ref 8.9–10.3)
Chloride: 99 mmol/L (ref 98–111)
Creatinine, Ser: 0.89 mg/dL (ref 0.44–1.00)
GFR, Estimated: >60 mL/min (ref >60)
Glucose, Bld: 126 mg/dL — ABNORMAL HIGH (ref 70–99)
Potassium: 4 mmol/L (ref 3.5–5.1)
Sodium: 134 mmol/L — ABNORMAL LOW (ref 135–145)

## 2021-01-04 LAB — TROPONIN I (HIGH SENSITIVITY)
Troponin I (High Sensitivity): <2 ng/L (ref <18)
Troponin I (High Sensitivity): <2 ng/L (ref <18)

## 2021-01-04 MED ORDER — LIDOCAINE 5 % EX PTCH
1.0000 | MEDICATED_PATCH | Freq: Once | CUTANEOUS | Status: DC
  Administered 2021-01-04: 1 via TRANSDERMAL
  Filled 2021-01-04: qty 1

## 2021-01-04 MED ORDER — LIDOCAINE 5 % EX PTCH: 1.0000 | MEDICATED_PATCH | Freq: Two times a day (BID) | CUTANEOUS | 0 refills | Status: DC

## 2021-01-04 MED ORDER — ACETAMINOPHEN 500 MG PO TABS
1000.0000 mg | ORAL_TABLET | Freq: Once | ORAL | Status: AC
  Administered 2021-01-04: 1000 mg via ORAL
  Filled 2021-01-04: qty 2

## 2021-01-04 NOTE — ED Triage Notes (Signed)
Pt reports centralized cp radiating to left that started last night with tingling in right arm . Pt reports right arm numbness down to fingers that started again a few hours ago but reports has been off and on all day.  Denies cp at this time.  Denies n/v/shob

## 2021-01-04 NOTE — ED Notes (Signed)
Called lab about missing trop result. State a machine went down; state they fixed it and the trop should result soon. Pt notified.

## 2021-01-04 NOTE — ED Provider Notes (Signed)
-----------------------------------------   9:21 PM on 01/04/2021 ----------------------------------------- Patient's repeat troponin is negative.  Patient denies any chest pain or shortness of breath.  Given the patient's reassuring work-up I believe she is safe for discharge home we will refer to cardiology for further work-up and treatment.  I discussed my typical chest pain return precautions.   Harvest Dark, MD 01/04/21 2121

## 2021-01-04 NOTE — Discharge Instructions (Addendum)
Return to the ED with any recurrent chest pain, strokelike symptoms or passing out.  Use Tylenol for pain and fevers.  Up to 1000 mg per dose, up to 4 times per day.  Do not take more than 4000 mg of Tylenol/acetaminophen within 24 hours..  Use naproxen/Aleve for anti-inflammatory pain relief. Use up to '500mg'$  every 12 hours. Do not take more frequently than this. Do not use other NSAIDs (ibuprofen, Advil) while taking this medication. It is safe to take Tylenol with this.   Please use lidocaine patches and your site of pain.  Apply 1 patch at a time, leave on for 12 hours, then remove for 12 hours.  12 hours on, 12 hours off.  Do not apply more than 1 patch at a time.

## 2021-01-04 NOTE — ED Provider Notes (Signed)
[facility] Emergency Department Provider Note ____________________________________________   Event Date/Time   First MD Initiated Contact with Patient 01/04/21 1900     (approximate)  I have reviewed the triage vital signs and the nursing notes.  HISTORY  Chief Complaint Chest Pain   HPI Samantha Moses is a 50 y.o. femalewho presents to the ED for evaluation of chest pain.   Chart review indicates metabolic syndrome without cardiac history.  Patient presents to the ED for activation of right arm paresthesias with associated chest pain palpitations.  She was at work today when she was developing right arm paresthesias and shooting sensation from her shoulder down towards her hand, this occurred on 2 occasions, and on the second occasion she developed associated anxiety and concern for her heart, and after this concern she developed chest palpitations and sharp pain.  Sharp pain lasted a few minutes before self resolving and has not recurred.  Denies any chest pain right now.  Reports continued tingling sensation to her right hand.  Denies any falls or trauma to her back, any neck pain.  Does report an aching pain to her right-sided paraspinal thoracic back and relates that this could be due to her work or doing some remodeling at her house.  But denies any discrete trauma.  Denies any falls, lower back pain, urinary or stool retention, fever or saddle paresthesias.  Denies recent illnesses, shortness of breath, cough.  Past Medical History:  Diagnosis Date   ADD (attention deficit disorder)    Depression    Diabetes mellitus without complication ([place])    Hyperlipidemia    Hypertension     Patient Active Problem List   Diagnosis Date Noted   OSA on CPAP 07/07/2020   Environmental and seasonal allergies 07/07/2020   Encounter for screening colonoscopy    Polyp of colon    Allergic rhinitis 08/22/2012   Hyperlipidemia associated with type 2 diabetes  mellitus ([place])    Type II diabetes mellitus with complication ([place])    Essential hypertension    Adjustment reaction with anxiety and depression    ADD (attention deficit disorder)     Past Surgical History:  Procedure Laterality Date   COLONOSCOPY WITH PROPOFOL N/A 11/27/2019   Procedure: COLONOSCOPY WITH PROPOFOL;  Surgeon: Virgel Manifold, MD;  Location: [facility] ENDOSCOPY;  Service: Endoscopy;  Laterality: N/A;   none      Prior to Admission medications   Medication Sig Start Date End Date Taking? Authorizing Provider  lidocaine (LIDODERM) 5 % Place 1 patch onto the skin every 12 (twelve) hours. Remove & Discard patch within 12 hours or as directed by MD 01/04/21 01/04/22 Yes Vladimir Crofts, MD  albuterol (PROVENTIL HFA;VENTOLIN HFA) 108 (90 BASE) MCG/ACT inhaler Inhale 2 puffs into the lungs every 4 (four) hours as needed for wheezing.     [provider]  amLODipine (NORVASC) 10 MG tablet TAKE 1 TABLET(10 MG) BY MOUTH DAILY 06/05/20   Glean Hess, MD  amphetamine-dextroamphetamine (ADDERALL XR) 20 MG 24 hr capsule Take 20 mg by mouth every morning. 01/15/20   [provider]  Continuous Blood Gluc Receiver (FREESTYLE LIBRE 2 READER) DEVI Use to test blood sugar twice a day 11/13/20   Glean Hess, MD  Continuous Blood Gluc Sensor (FREESTYLE LIBRE 2 SENSOR) MISC Use to test blood sugar twice daily 11/12/20   Glean Hess, MD  FLUTICASONE PROPIONATE, NASAL, NA  08/16/20   [provider]  glucose blood (  TRUE METRIX BLOOD GLUCOSE TEST) test strip Dx E11.9 10/25/19   Glean Hess, MD  levocetirizine (XYZAL) 5 MG tablet TAKE 1 TABLET BY MOUTH EVERY EVENING 02/11/20   Glean Hess, MD  losartan (COZAAR) 100 MG tablet TAKE 1 TABLET BY MOUTH EVERY DAY 11/19/20   Glean Hess, MD  metFORMIN (GLUCOPHAGE) 1000 MG tablet TAKE 1 TABLET(1000 MG) BY MOUTH TWICE DAILY WITH A MEAL 11/19/20   Glean Hess, MD  montelukast (SINGULAIR) 10 MG tablet TAKE 1  TABLET BY MOUTH EVERY DAY 10/06/20   Glean Hess, MD  Multiple Vitamin (MULTIVITAMIN) tablet Take 1 tablet by mouth daily.    [provider]  Omega-3 Fatty Acids (FISH OIL TRIPLE STRENGTH) 1400 MG CAPS Take by mouth 2 (two) times daily.    [provider]  rosuvastatin (CRESTOR) 5 MG tablet TAKE 1 TABLET(5 MG) BY MOUTH AT BEDTIME 11/19/20   Glean Hess, MD  sertraline (ZOLOFT) 50 MG tablet Take 50 mg by mouth daily.    [provider]    Allergies Sulfa antibiotics, Doxycycline, Lisinopril, and Cefzil [cefprozil]  Family History  Problem Relation Age of Onset   Hyperlipidemia Mother    Hypertension Mother    Hyperlipidemia Father    Hypertension Father    Diabetes Father    Cancer Maternal Aunt 25       breast CA   Breast cancer Cousin    Breast cancer Other    Breast cancer Other     Social History Social History   Tobacco Use   Smoking status: Never   Smokeless tobacco: Never  Vaping Use   Vaping Use: Never used  Substance Use Topics   Alcohol use: Not Currently    Alcohol/week: 0.0 standard drinks    Comment: rarely   Drug use: No    Review of Systems  Constitutional: No fever/chills Eyes: No visual changes. ENT: No sore throat. Cardiovascular: Positive chest pain and palpitations. Respiratory: Denies shortness of breath. Gastrointestinal: No abdominal pain.  No nausea, no vomiting.  No diarrhea.  No constipation. Genitourinary: Negative for dysuria. Musculoskeletal: Negative for back pain. Skin: Negative for rash. Neurological: Negative for headaches, focal weakness or numbness. Positive for right arm paresthesias. ____________________________________________   PHYSICAL EXAM:  VITAL SIGNS: Vitals:   01/04/21 1720 01/04/21 1910  BP: 137/89 (!) 144/95  Pulse: 90 78  Resp: 18 17  Temp: 98.4 F (36.9 C)   SpO2: 98% 97%    Constitutional: Alert and oriented. Well appearing and in no acute distress. Eyes:  Conjunctivae are normal. PERRL. EOMI. Head: Atraumatic. Nose: No congestion/rhinnorhea. Mouth/Throat: Mucous membranes are moist.  Oropharynx non-erythematous. Neck: No stridor. No cervical spine tenderness to palpation. Cardiovascular: Normal rate, regular rhythm. Grossly normal heart sounds.  Good peripheral circulation. Respiratory: Normal respiratory effort.  No retractions. Lungs CTAB. Gastrointestinal: Soft , nondistended, nontender to palpation. No CVA tenderness. Musculoskeletal: No lower extremity tenderness nor edema.  No joint effusions. No signs of acute trauma. Right-sided paraspinal thoracic muscular tenderness to palpation.  No overlying skin changes or signs of trauma. No spinal step-offs or bony tenderness throughout the thoracic or cervical spine. Right arm appears normal to the left without any swelling, signs of trauma or deformity.  Distally neurovascularly intact with strong and symmetric radial pulses Neurologic:  Normal speech and language. No gross focal neurologic deficits are appreciated. No gait instability noted. Cranial nerves II through XII intact 5/5 strength and sensation in all 4 extremities Ambulatory  with a normal gait without distress Skin:  Skin is warm, dry and intact. No rash noted. Psychiatric: Mood and affect are normal. Speech and behavior are normal. ____________________________________________   LABS (all labs ordered are listed, but only abnormal results are displayed)  Labs Reviewed  BASIC METABOLIC PANEL - Abnormal; Notable for the following components:      Result Value   Sodium 134 (*)    Glucose, Bld 126 (*)    BUN 23 (*)    All other components within normal limits  CBC  POC URINE PREG, ED  TROPONIN I (HIGH SENSITIVITY)  TROPONIN I (HIGH SENSITIVITY)   ____________________________________________  12 Lead EKG  Sinus rhythm with a rate of 83 bpm.  Normal axis and intervals.  No evidence of acute ischemia.  Low  voltage. ____________________________________________  RADIOLOGY  ED MD interpretation: 2 view CXR reviewed by me without evidence of acute cardiopulmonary pathology.  Official radiology report(s): DG Chest 2 View  Result Date: 01/04/2021 CLINICAL DATA:  Chest pain. EXAM: CHEST - 2 VIEW COMPARISON:  None. FINDINGS: The heart size and mediastinal contours are within normal limits. Both lungs are clear. The visualized skeletal structures are unremarkable. IMPRESSION: No active cardiopulmonary disease. Electronically Signed   By: Virgina Norfolk M.D.   On: 01/04/2021 18:19    ____________________________________________   PROCEDURES and INTERVENTIONS  Procedure(s) performed (including Critical Care):  Procedures  Medications  lidocaine (LIDODERM) 5 % 1 patch (has no administration in time range)  acetaminophen (TYLENOL) tablet 1,000 mg (has no administration in time range)    ____________________________________________   MDM / ED COURSE   50 year old woman presents to the ED with paresthesias of the right arm, likely due to atraumatic thoracic back spasms.  Chest pain is atypical and possibly due to anxiety.  No evidence of ACS, PTX or electrolyte derangements.  No signs of trauma and no indication for spinal imaging.  We will provide Tylenol and lidocaine patch to address the muscular spasm in the back, likely causing her paresthesias.  We will pursue a second troponin and anticipate outpatient management if this is not significantly elevated.     ____________________________________________   FINAL CLINICAL IMPRESSION(S) / ED DIAGNOSES  Final diagnoses:  Paresthesia of right arm  Muscle spasm  Other chest pain     ED Discharge Orders          Ordered    lidocaine (LIDODERM) 5 %  Every 12 hours        01/04/21 1926             Timber Marshman Tamala Julian   Note:  This document was prepared using Dragon voice recognition software and may include unintentional dictation  errors.    Vladimir Crofts, MD 01/04/21 [phone]

## 2021-01-05 ENCOUNTER — Other Ambulatory Visit: Payer: Self-pay | Admitting: Internal Medicine

## 2021-01-05 ENCOUNTER — Encounter: Payer: Self-pay | Admitting: Internal Medicine

## 2021-01-05 DIAGNOSIS — R0789 Other chest pain: Secondary | ICD-10-CM

## 2021-01-11 ENCOUNTER — Encounter: Payer: Self-pay | Admitting: Internal Medicine

## 2021-01-18 ENCOUNTER — Ambulatory Visit (INDEPENDENT_AMBULATORY_CARE_PROVIDER_SITE_OTHER): Payer: 59 | Admitting: Internal Medicine

## 2021-01-18 ENCOUNTER — Other Ambulatory Visit: Payer: Self-pay

## 2021-01-18 ENCOUNTER — Encounter: Payer: Self-pay | Admitting: Internal Medicine

## 2021-01-18 VITALS — BP 126/84 | HR 87 | Temp 98.1°F | Ht 63.0 in | Wt 213.0 lb

## 2021-01-18 DIAGNOSIS — M778 Other enthesopathies, not elsewhere classified: Secondary | ICD-10-CM | POA: Diagnosis not present

## 2021-01-18 DIAGNOSIS — G5601 Carpal tunnel syndrome, right upper limb: Secondary | ICD-10-CM | POA: Insufficient documentation

## 2021-01-18 DIAGNOSIS — E118 Type 2 diabetes mellitus with unspecified complications: Secondary | ICD-10-CM

## 2021-01-18 MED ORDER — MELOXICAM 15 MG PO TABS
15.0000 mg | ORAL_TABLET | Freq: Every day | ORAL | 0 refills | Status: DC
Start: 2021-01-18 — End: 2021-02-12

## 2021-01-18 MED ORDER — DAPAGLIFLOZIN PROPANEDIOL 10 MG PO TABS: 10.0000 mg | ORAL_TABLET | Freq: Every day | ORAL | 0 refills | Status: DC

## 2021-01-18 MED ORDER — CYCLOBENZAPRINE HCL 10 MG PO TABS
10.0000 mg | ORAL_TABLET | Freq: Every day | ORAL | 1 refills | Status: DC
Start: 2021-01-18 — End: 2021-02-12

## 2021-01-18 NOTE — Progress Notes (Signed)
Date:  01/18/2021   Name:  Samantha Moses   DOB:  29-Apr-1970   MRN:  [id number]   Chief Complaint: Shoulder Pain (X3 weeks right shoulder pain when raising arm up , hand going numb/tingling for over a week, ) She installed closet organizers in her remodeled home several weeks ago which required a lot of twisting, bending, reaching and use of a screwdriver.  Shoulder Pain  The pain is present in the right shoulder and neck. This is a recurrent problem. The current episode started more than 1 month ago. The problem occurs daily. The quality of the pain is described as aching. The pain is mild. Associated symptoms include numbness (in fingers of right hand). Pertinent negatives include no fever. She has tried NSAIDS for the symptoms.  Wrist Pain  The pain is present in the right wrist. This is a new problem. The current episode started more than 1 month ago. The problem occurs daily. The problem has been unchanged. Associated symptoms include numbness (in fingers of right hand). Pertinent negatives include no fever.  Diabetes She presents for her follow-up diabetic visit. She has type 2 diabetes mellitus. Her disease course has been improving (very slowly -). There are no hypoglycemic associated symptoms. Pertinent negatives for hypoglycemia include no nervousness/anxiousness. Associated symptoms include chest pain (seeing cardiology soon). Pertinent negatives for diabetes include no fatigue and no weakness. Current diabetic treatment includes oral agent (monotherapy). She is compliant with treatment all of the time. Her weight is decreasing steadily. Her home blood glucose trend is decreasing steadily. Her overall blood glucose range is 140-180 mg/dl.   Lab Results  Component Value Date   CREATININE 0.89 01/04/2021   BUN 23 (H) 01/04/2021   NA 134 (L) 01/04/2021   K 4.0 01/04/2021   CL 99 01/04/2021   CO2 25 01/04/2021   Lab Results  Component Value Date   CHOL 171 03/04/2020   HDL 49  03/04/2020   LDLCALC 93 03/04/2020   LDLDIRECT 151.0 06/11/2018   TRIG 168 (H) 03/04/2020   CHOLHDL 3.5 03/04/2020   Lab Results  Component Value Date   TSH 2.900 03/04/2020   Lab Results  Component Value Date   HGBA1C 8.1 (A) 10/21/2020   Lab Results  Component Value Date   WBC 8.1 01/04/2021   HGB 14.7 01/04/2021   HCT 41.9 01/04/2021   MCV 88.4 01/04/2021   PLT 229 01/04/2021   Lab Results  Component Value Date   ALT 49 (H) 03/04/2020   AST 26 03/04/2020   ALKPHOS 77 03/04/2020   BILITOT 0.3 03/04/2020     Review of Systems  Constitutional:  Positive for unexpected weight change (working on diet to improve DM). Negative for chills, fatigue and fever.  Respiratory:  Negative for cough, chest tightness and shortness of breath.   Cardiovascular:  Positive for chest pain (seeing cardiology soon). Negative for palpitations and leg swelling.  Musculoskeletal:  Positive for arthralgias (right shoulder and neck). Negative for gait problem and joint swelling.  Neurological:  Positive for numbness (in fingers of right hand). Negative for weakness.  Psychiatric/Behavioral:  Negative for dysphoric mood and sleep disturbance. The patient is not nervous/anxious.    Patient Active Problem List   Diagnosis Date Noted   OSA on CPAP 07/07/2020   Environmental and seasonal allergies 07/07/2020   Encounter for screening colonoscopy    Polyp of colon    Allergic rhinitis 08/22/2012   Hyperlipidemia associated with type 2 diabetes mellitus (  [place])    Type II diabetes mellitus with complication ([place])    Essential hypertension    Adjustment reaction with anxiety and depression    ADD (attention deficit disorder)     Allergies  Allergen Reactions   Sulfa Antibiotics Anaphylaxis   Doxycycline     rash   Lisinopril     cough   Cefzil [Cefprozil] Rash    Past Surgical History:  Procedure Laterality Date   COLONOSCOPY WITH PROPOFOL N/A 11/27/2019   Procedure: COLONOSCOPY WITH  PROPOFOL;  Surgeon: Virgel Manifold, MD;  Location: [facility] ENDOSCOPY;  Service: Endoscopy;  Laterality: N/A;   none      Social History   Tobacco Use   Smoking status: Never   Smokeless tobacco: Never  Vaping Use   Vaping Use: Never used  Substance Use Topics   Alcohol use: Not Currently    Alcohol/week: 0.0 standard drinks    Comment: rarely   Drug use: No     Medication list has been reviewed and updated.  Current Meds  Medication Sig   albuterol (PROVENTIL HFA;VENTOLIN HFA) 108 (90 BASE) MCG/ACT inhaler Inhale 2 puffs into the lungs every 4 (four) hours as needed for wheezing.    amLODipine (NORVASC) 10 MG tablet TAKE 1 TABLET(10 MG) BY MOUTH DAILY   Continuous Blood Gluc Sensor (FREESTYLE LIBRE 2 SENSOR) MISC Use to test blood sugar twice daily   FLUTICASONE PROPIONATE, NASAL, NA    glucose blood (TRUE METRIX BLOOD GLUCOSE TEST) test strip Dx E11.9   IBUPROFEN PO Take by mouth as needed.   levocetirizine (XYZAL) 5 MG tablet TAKE 1 TABLET BY MOUTH EVERY EVENING   losartan (COZAAR) 100 MG tablet TAKE 1 TABLET BY MOUTH EVERY DAY   metFORMIN (GLUCOPHAGE) 1000 MG tablet TAKE 1 TABLET(1000 MG) BY MOUTH TWICE DAILY WITH A MEAL   montelukast (SINGULAIR) 10 MG tablet TAKE 1 TABLET BY MOUTH EVERY DAY   Multiple Vitamin (MULTIVITAMIN) tablet Take 1 tablet by mouth daily.   Omega-3 Fatty Acids (FISH OIL TRIPLE STRENGTH) 1400 MG CAPS Take by mouth 2 (two) times daily.   rosuvastatin (CRESTOR) 5 MG tablet TAKE 1 TABLET(5 MG) BY MOUTH AT BEDTIME   sertraline (ZOLOFT) 50 MG tablet Take 50 mg by mouth daily.   VYVANSE 30 MG capsule Take 30 mg by mouth every morning.    PHQ 2/9 Scores 01/18/2021 10/21/2020 07/07/2020 03/04/2020  PHQ - 2 Score 0 0 0 0  PHQ- 9 Score 1 3 0 0    GAD 7 : Generalized Anxiety Score 01/18/2021 10/21/2020 07/07/2020 03/04/2020  Nervous, Anxious, on Edge 0 0 0 0  Control/stop worrying 0 1 0 0  Worry too much - different things 0 0 0 0  Trouble relaxing 0 0 0 0   Restless 0 0 0 0  Easily annoyed or irritable 1 2 0 0  Afraid - awful might happen 0 0 0 0  Total GAD 7 Score 1 3 0 0  Anxiety Difficulty - Not difficult at all - Not difficult at all    BP Readings from Last 3 Encounters:  01/18/21 126/84  01/04/21 (!) 144/95  10/21/20 126/78    Physical Exam Constitutional:      Appearance: Normal appearance.  Neck:     Vascular: No carotid bruit.  Cardiovascular:     Rate and Rhythm: Normal rate and regular rhythm.     Pulses: Normal pulses.  Pulmonary:     Effort: Pulmonary effort is normal.  Breath sounds: Normal breath sounds.  Musculoskeletal:     Right shoulder: No tenderness or bony tenderness. Normal range of motion. Normal strength.     Left shoulder: Normal strength.     Right wrist: No tenderness or crepitus. Normal range of motion. Normal pulse.     Left wrist: Normal. No crepitus. Normal pulse.     Cervical back: Normal range of motion. Spasms and tenderness (on right) present.     Comments: + Tinel's and PHalen's on right  Lymphadenopathy:     Cervical: No cervical adenopathy.  Skin:    General: Skin is warm and dry.  Neurological:     Mental Status: She is alert.    Wt Readings from Last 3 Encounters:  01/18/21 213 lb (96.6 kg)  01/04/21 215 lb (97.5 kg)  10/21/20 221 lb (100.2 kg)    BP 126/84   Pulse 87   Temp 98.1 F (36.7 C) (Oral)   Ht 5\' 3"  (1.6 m)   Wt 213 lb (96.6 kg)   LMP 01/17/2020 (Approximate)   SpO2 95%   BMI 37.73 kg/m   Assessment and Plan: 1. Carpal tunnel syndrome of right wrist Recommend wrist splint to be worn while sleeping and at home during stressful activities Take Mobic daily If no improvement in 3-4 weeks, will refer to Ortho Hand specialist - meloxicam (MOBIC) 15 MG tablet; Take 1 tablet (15 mg total) by mouth daily.  Dispense: 30 tablet; Refill: 0  2. Shoulder tendonitis, right Use heat or ice and take Mobic Flexeril at bedtime - cyclobenzaprine (FLEXERIL) 10 MG  tablet; Take 1 tablet (10 mg total) by mouth at bedtime.  Dispense: 30 tablet; Refill: 1  3. Type II diabetes mellitus with complication (HCC) Continue metformin and diet Will add Farxiga - dapagliflozin propanediol (FARXIGA) 10 MG TABS tablet; Take 1 tablet (10 mg total) by mouth daily before breakfast.  Dispense: 90 tablet; Refill: 0   Partially dictated using Editor, commissioning. Any errors are unintentional.  Halina Maidens, MD [facility]  01/18/2021

## 2021-01-18 NOTE — Patient Instructions (Signed)
Cock up wrist splint - wear while sleeping

## 2021-02-11 ENCOUNTER — Ambulatory Visit: Payer: 59 | Admitting: Cardiology

## 2021-02-12 ENCOUNTER — Ambulatory Visit: Payer: 59 | Admitting: Cardiology

## 2021-02-12 ENCOUNTER — Encounter: Payer: Self-pay | Admitting: Cardiology

## 2021-02-12 ENCOUNTER — Other Ambulatory Visit: Payer: Self-pay

## 2021-02-12 ENCOUNTER — Encounter: Payer: Self-pay | Admitting: Internal Medicine

## 2021-02-12 VITALS — BP 122/70 | HR 90 | Ht 63.0 in | Wt 216.0 lb

## 2021-02-12 DIAGNOSIS — I10 Essential (primary) hypertension: Secondary | ICD-10-CM | POA: Diagnosis not present

## 2021-02-12 DIAGNOSIS — E7800 Pure hypercholesterolemia, unspecified: Secondary | ICD-10-CM | POA: Diagnosis not present

## 2021-02-12 DIAGNOSIS — R072 Precordial pain: Secondary | ICD-10-CM

## 2021-02-12 NOTE — Patient Instructions (Signed)
Medication Instructions:   Your physician recommends that you continue on your current medications as directed. Please refer to the Current Medication list given to you today.  *If you need a refill on your cardiac medications before your next appointment, please call your pharmacy*   Lab Work:  None Ordered  If you have labs (blood work) drawn today and your tests are completely normal, you will receive your results only by: [place] (if you have MyChart) OR A paper copy in the mail If you have any lab test that is abnormal or we need to change your treatment, we will call you to review the results.   Testing/Procedures:  Echocardiogram   Please return to [facility] on ______________ at _______________ AM/PM for an Echocardiogram. Your physician has requested that you have an echocardiogram. Echocardiography is a painless test that uses sound waves to create images of your heart. It provides your doctor with information about the size and shape of your heart and how well your heart's chambers and valves are working. This procedure takes approximately one hour. There are no restrictions for this procedure. Please note; depending on visual quality an IV may need to be placed.   Follow-Up: At [facility], you and your health needs are our priority.  As part of our continuing mission to provide you with exceptional heart care, we have created designated Provider Care Teams.  These Care Teams include your primary Cardiologist (physician) and Advanced Practice Providers (APPs -  Physician Assistants and Nurse Practitioners) who all work together to provide you with the care you need, when you need it.  We recommend signing up for the patient portal called "MyChart".  Sign up information is provided on this After Visit Summary.  MyChart is used to connect with patients for Virtual Visits (Telemedicine).  Patients are able to view lab/test results, encounter notes,  upcoming appointments, etc.  Non-urgent messages can be sent to your provider as well.   To learn more about what you can do with MyChart, go to [email or web address].    Your next appointment:   2 month(s)  The format for your next appointment:   In Person  Provider:   You may see Dr. Garen Lah or one of the following Advanced Practice Providers on your designated Care Team:   Murray Hodgkins, NP Christell Faith, PA-C Marrianne Mood, PA-C Cadence [place], [place]

## 2021-02-12 NOTE — Progress Notes (Signed)
Cardiology Office Note:    Date:  02/12/2021   ID:  Samantha Moses, DOB 1971/04/16, MRN [id number]  PCP:  Glean Hess, MD   [facility] Providers Cardiologist:  None     Referring MD: Glean Hess, MD   Chief Complaint  Patient presents with   New Patient (Initial Visit)    Referred by PCP for chest pain. Meds reviewed verbally with patient.    Samantha Moses is a 50 y.o. female who is being seen today for the evaluation of chest pain at the request of Glean Hess, MD.   History of Present Illness:    Samantha Moses is a 50 y.o. female with a hx of hypertension, hyperlipidemia, diabetes, ADHD who presents due to chest pain.  Patient was moving boxes around her home about a month ago when she had right-sided chest discomfort and right arm numbness.  Followed up with PCP, symptoms of chest discomfort due to musculoskeletal, possibly from a pulled muscle.  Was later on diagnosed with carpal tunnel syndrome, currently wearing wrist brace at night.  Denies chest pain or shortness of breath with ordinary exertion.  States her father had a history of CAD/stents in his 26s, father is a smoker.  She has exercise-induced asthma, GERD shortness of breath when she overexerts herself.  Past Medical History:  Diagnosis Date   ADD (attention deficit disorder)    Depression    Diabetes mellitus without complication ([place])    Hyperlipidemia    Hypertension     Past Surgical History:  Procedure Laterality Date   COLONOSCOPY WITH PROPOFOL N/A 11/27/2019   Procedure: COLONOSCOPY WITH PROPOFOL;  Surgeon: Virgel Manifold, MD;  Location: [facility] ENDOSCOPY;  Service: Endoscopy;  Laterality: N/A;   none      Current Medications: Current Meds  Medication Sig   albuterol (PROVENTIL HFA;VENTOLIN HFA) 108 (90 BASE) MCG/ACT inhaler Inhale 2 puffs into the lungs every 4 (four) hours as needed for wheezing.    amLODipine (NORVASC) 10 MG tablet TAKE 1 TABLET(10 MG) BY MOUTH DAILY    dapagliflozin propanediol (FARXIGA) 10 MG TABS tablet Take 1 tablet (10 mg total) by mouth daily before breakfast.   FLUTICASONE PROPIONATE, NASAL, NA    glucose blood (TRUE METRIX BLOOD GLUCOSE TEST) test strip Dx E11.9   IBUPROFEN PO Take by mouth as needed.   levocetirizine (XYZAL) 5 MG tablet TAKE 1 TABLET BY MOUTH EVERY EVENING   losartan (COZAAR) 100 MG tablet TAKE 1 TABLET BY MOUTH EVERY DAY   metFORMIN (GLUCOPHAGE) 1000 MG tablet TAKE 1 TABLET(1000 MG) BY MOUTH TWICE DAILY WITH A MEAL   montelukast (SINGULAIR) 10 MG tablet TAKE 1 TABLET BY MOUTH EVERY DAY   Multiple Vitamin (MULTIVITAMIN) tablet Take 1 tablet by mouth daily.   Omega-3 Fatty Acids (FISH OIL TRIPLE STRENGTH) 1400 MG CAPS Take by mouth 2 (two) times daily.   rosuvastatin (CRESTOR) 5 MG tablet TAKE 1 TABLET(5 MG) BY MOUTH AT BEDTIME   sertraline (ZOLOFT) 50 MG tablet Take 50 mg by mouth daily.   VYVANSE 30 MG capsule Take 30 mg by mouth every morning.     Allergies:   Sulfa antibiotics, Doxycycline, Lisinopril, and Cefzil [cefprozil]   Social History   Socioeconomic History   Marital status: Single    Spouse name: Not on file   Number of children: Not on file   Years of education: Not on file   Highest education level: Not on file  Occupational History  Occupation: Occupational psychologist  Tobacco Use   Smoking status: Never   Smokeless tobacco: Never  Vaping Use   Vaping Use: Never used  Substance and Sexual Activity   Alcohol use: Not Currently    Alcohol/week: 0.0 standard drinks    Comment: rarely   Drug use: No   Sexual activity: Not Currently    Partners: Male  Other Topics Concern   Not on file  Social History Narrative   Not on file   Social Determinants of Health   Financial Resource Strain: Not on file  Food Insecurity: Not on file  Transportation Needs: Not on file  Physical Activity: Not on file  Stress: Not on file  Social Connections: Not on file     Family History: The patient's family  history includes Breast cancer in her cousin and other family members; Cancer (age of onset: 20) in her maternal aunt; Diabetes in her father; Hyperlipidemia in her father and mother; Hypertension in her father and mother.  ROS:   Please see the history of present illness.     All other systems reviewed and are negative.  EKGs/Labs/Other Studies Reviewed:    The following studies were reviewed today:   EKG:  EKG is  ordered today.  The ekg ordered today demonstrates normal sinus rhythm  Recent Labs: 03/04/2020: ALT 49; TSH 2.900 01/04/2021: BUN 23; Creatinine, Ser 0.89; Hemoglobin 14.7; Platelets 229; Potassium 4.0; Sodium 134  Recent Lipid Panel    Component Value Date/Time   CHOL 171 03/04/2020 1002   TRIG 168 (H) 03/04/2020 1002   HDL 49 03/04/2020 1002   CHOLHDL 3.5 03/04/2020 1002   CHOLHDL 5 06/11/2018 0827   VLDL 63.6 (H) 06/11/2018 0827   LDLCALC 93 03/04/2020 1002   LDLDIRECT 151.0 06/11/2018 0827     Risk Assessment/Calculations:         Physical Exam:    VS:  BP 122/70 (BP Location: Left Arm, Patient Position: Sitting, Cuff Size: Normal)   Pulse 90   Ht 5\' 3"  (1.6 m)   Wt 216 lb (98 kg)   LMP 01/17/2020 (Approximate)   SpO2 97%   BMI 38.26 kg/m     Wt Readings from Last 3 Encounters:  02/12/21 216 lb (98 kg)  01/18/21 213 lb (96.6 kg)  01/04/21 215 lb (97.5 kg)     GEN:  Well nourished, well developed in no acute distress HEENT: Normal NECK: No JVD; No carotid bruits LYMPHATICS: No lymphadenopathy CARDIAC: RRR, no murmurs, rubs, gallops RESPIRATORY:  Clear to auscultation without rales, wheezing or rhonchi  ABDOMEN: Soft, non-tender, non-distended MUSCULOSKELETAL:  No edema; No deformity  SKIN: Warm and dry NEUROLOGIC:  Alert and oriented x 3 PSYCHIATRIC:  Normal affect   ASSESSMENT:    1. Precordial pain   2. Primary hypertension   3. Pure hypercholesterolemia    PLAN:    In order of problems listed above:  Chest pain, likely  musculoskeletal in etiology.  Obtain echocardiogram to evaluate any structural abnormalities.  She has risk factors for cardiac disease.  If no gross structural abnormalities are noted, plan to continue watching patient closely.  Symptoms not consistent with angina. Hypertension, BP controlled.  Continue losartan, Norvasc. Hyperlipidemia, continue Crestor.  follow up after echocardiogram.    Medication Adjustments/Labs and Tests Ordered: Current medicines are reviewed at length with the patient today.  Concerns regarding medicines are outlined above.  Orders Placed This Encounter  Procedures   EKG 12-Lead   ECHOCARDIOGRAM COMPLETE  No orders of the defined types were placed in this encounter.   Patient Instructions  Medication Instructions:   Your physician recommends that you continue on your current medications as directed. Please refer to the Current Medication list given to you today.  *If you need a refill on your cardiac medications before your next appointment, please call your pharmacy*   Lab Work:  None Ordered  If you have labs (blood work) drawn today and your tests are completely normal, you will receive your results only by: [place] (if you have MyChart) OR A paper copy in the mail If you have any lab test that is abnormal or we need to change your treatment, we will call you to review the results.   Testing/Procedures:  Echocardiogram   Please return to [facility] on ______________ at _______________ AM/PM for an Echocardiogram. Your physician has requested that you have an echocardiogram. Echocardiography is a painless test that uses sound waves to create images of your heart. It provides your doctor with information about the size and shape of your heart and how well your heart's chambers and valves are working. This procedure takes approximately one hour. There are no restrictions for this procedure. Please note; depending on visual  quality an IV may need to be placed.   Follow-Up: At [facility], you and your health needs are our priority.  As part of our continuing mission to provide you with exceptional heart care, we have created designated Provider Care Teams.  These Care Teams include your primary Cardiologist (physician) and Advanced Practice Providers (APPs -  Physician Assistants and Nurse Practitioners) who all work together to provide you with the care you need, when you need it.  We recommend signing up for the patient portal called "MyChart".  Sign up information is provided on this After Visit Summary.  MyChart is used to connect with patients for Virtual Visits (Telemedicine).  Patients are able to view lab/test results, encounter notes, upcoming appointments, etc.  Non-urgent messages can be sent to your provider as well.   To learn more about what you can do with MyChart, go to [email or web address].    Your next appointment:   2 month(s)  The format for your next appointment:   In Person  Provider:   You may see Dr. Garen Lah or one of the following Advanced Practice Providers on your designated Care Team:   Murray Hodgkins, NP Christell Faith, PA-C Marrianne Mood, PA-C Cadence [place], [place]    Signed, Kate Sable, MD  02/12/2021 2:49 PM    [place]

## 2021-02-17 ENCOUNTER — Other Ambulatory Visit: Payer: Self-pay | Admitting: Internal Medicine

## 2021-02-17 DIAGNOSIS — E118 Type 2 diabetes mellitus with unspecified complications: Secondary | ICD-10-CM

## 2021-02-17 NOTE — Telephone Encounter (Signed)
Requested Prescriptions  Pending Prescriptions Disp Refills  . amLODipine (NORVASC) 10 MG tablet [Pharmacy Med Name: AMLODIPINE BESYLATE 10MG  TABLETS] 90 tablet 0    Sig: TAKE 1 TABLET(10 MG) BY MOUTH DAILY     Cardiovascular:  Calcium Channel Blockers Passed - 02/17/2021  6:29 AM      Passed - Last BP in normal range    BP Readings from Last 1 Encounters:  02/12/21 122/70         Passed - Valid encounter within last 6 months    Recent Outpatient Visits          1 month ago Carpal tunnel syndrome of right wrist   [facility] Glean Hess, MD   3 months ago Type II diabetes mellitus with complication [facility])   [facility] Glean Hess, MD   7 months ago Type II diabetes mellitus with complication [facility])   [facility] Glean Hess, MD   11 months ago Annual physical exam   [facility] Glean Hess, MD   1 year ago Type II diabetes mellitus with complication [facility])   [facility] Glean Hess, MD      Future Appointments            In 3 weeks [place] Melia Jesse Sans, MD [place], [place]   In 1 month [place], Aaron Edelman, MD [facility], [place]

## 2021-03-10 ENCOUNTER — Ambulatory Visit (INDEPENDENT_AMBULATORY_CARE_PROVIDER_SITE_OTHER): Payer: 59 | Admitting: Internal Medicine

## 2021-03-10 ENCOUNTER — Encounter: Payer: Self-pay | Admitting: Internal Medicine

## 2021-03-10 ENCOUNTER — Other Ambulatory Visit: Payer: Self-pay

## 2021-03-10 VITALS — BP 110/78 | HR 81 | Ht 63.0 in | Wt 216.0 lb

## 2021-03-10 DIAGNOSIS — Z1231 Encounter for screening mammogram for malignant neoplasm of breast: Secondary | ICD-10-CM | POA: Diagnosis not present

## 2021-03-10 DIAGNOSIS — E118 Type 2 diabetes mellitus with unspecified complications: Secondary | ICD-10-CM

## 2021-03-10 DIAGNOSIS — E785 Hyperlipidemia, unspecified: Secondary | ICD-10-CM | POA: Diagnosis not present

## 2021-03-10 DIAGNOSIS — E1169 Type 2 diabetes mellitus with other specified complication: Secondary | ICD-10-CM

## 2021-03-10 DIAGNOSIS — Z0000 Encounter for general adult medical examination without abnormal findings: Secondary | ICD-10-CM | POA: Diagnosis not present

## 2021-03-10 DIAGNOSIS — I10 Essential (primary) hypertension: Secondary | ICD-10-CM

## 2021-03-10 LAB — POCT URINALYSIS DIPSTICK
Bilirubin, UA: NEGATIVE
Blood, UA: NEGATIVE
Glucose, UA: POSITIVE — AB
Ketones, UA: NEGATIVE
Leukocytes, UA: NEGATIVE
Nitrite, UA: NEGATIVE
Protein, UA: NEGATIVE
Spec Grav, UA: 1.01 (ref 1.010–1.025)
Urobilinogen, UA: 0.2 E.U./dL
pH, UA: 6 (ref 5.0–8.0)

## 2021-03-10 LAB — POCT UA - MICROALBUMIN: Microalbumin Ur, POC: NEGATIVE mg/L

## 2021-03-10 NOTE — Progress Notes (Signed)
Date:  03/10/2021   Name:  Samantha Moses   DOB:  1970-12-24   MRN:  [id number]   Chief Complaint: Annual Exam [name] is a 50 y.o. female who presents today for her Complete Annual Exam. She feels well. She reports exercising - none. She reports she is sleeping fairly well. Breast complaints - none.  Mammogram: 04/2020 @ [facility] DEXA: none Pap smear: 04/2017 neg with co-testing Colonoscopy: 11/2019  Immunization History  Administered Date(s) Administered   Influenza, Quadrivalent, Recombinant, Inj, Pf 02/24/2017   Influenza,inj,Quad PF,6+ Mos 01/17/2018, 01/05/2019   Influenza-Unspecified 02/16/2015, 01/17/2018, 01/02/2020, 01/31/2021   Moderna Sars-Covid-2 Vaccination 05/02/2019, 05/30/2019, 02/09/2020, 11/03/2020   Pneumococcal Polysaccharide-23 06/13/2014   Tdap 11/01/2012   Zoster Recombinat (Shingrix) 10/02/2020, 01/31/2021    Hypertension This is a chronic problem. The problem is controlled. Pertinent negatives include no chest pain, headaches, palpitations or shortness of breath. Past treatments include calcium channel blockers and angiotensin blockers. The current treatment provides significant improvement. There is no history of kidney disease, CAD/MI or CVA.  Diabetes She presents for her follow-up diabetic visit. She has type 2 diabetes mellitus. Her disease course has been stable (rybelsus added last visit but not tolerated). Pertinent negatives for hypoglycemia include no dizziness, headaches, nervousness/anxiousness or tremors. Pertinent negatives for diabetes include no chest pain, no fatigue, no polydipsia and no polyuria. Pertinent negatives for diabetic complications include no CVA. Current diabetic treatment includes oral agent (dual therapy) (farxiga and metformin). Her weight is stable. An ACE inhibitor/angiotensin II receptor blocker is being taken. Eye exam is current.  Hyperlipidemia This is a chronic problem. The problem is controlled. Pertinent  negatives include no chest pain or shortness of breath. Current antihyperlipidemic treatment includes statins and herbal therapy. The current treatment provides significant improvement of lipids. Risk factors for coronary artery disease include diabetes mellitus.   Lab Results  Component Value Date   CREATININE 0.89 01/04/2021   BUN 23 (H) 01/04/2021   NA 134 (L) 01/04/2021   K 4.0 01/04/2021   CL 99 01/04/2021   CO2 25 01/04/2021   Lab Results  Component Value Date   CHOL 171 03/04/2020   HDL 49 03/04/2020   LDLCALC 93 03/04/2020   LDLDIRECT 151.0 06/11/2018   TRIG 168 (H) 03/04/2020   CHOLHDL 3.5 03/04/2020   Lab Results  Component Value Date   TSH 2.900 03/04/2020   Lab Results  Component Value Date   HGBA1C 8.1 (A) 10/21/2020   Lab Results  Component Value Date   WBC 8.1 01/04/2021   HGB 14.7 01/04/2021   HCT 41.9 01/04/2021   MCV 88.4 01/04/2021   PLT 229 01/04/2021   Lab Results  Component Value Date   ALT 49 (H) 03/04/2020   AST 26 03/04/2020   ALKPHOS 77 03/04/2020   BILITOT 0.3 03/04/2020   No components found for: VITD  Review of Systems  Constitutional:  Negative for chills, fatigue and fever.  HENT:  Negative for congestion, hearing loss, tinnitus, trouble swallowing and voice change.   Eyes:  Negative for visual disturbance.  Respiratory:  Negative for cough, chest tightness, shortness of breath and wheezing.   Cardiovascular:  Negative for chest pain, palpitations and leg swelling.  Gastrointestinal:  Negative for abdominal pain, constipation, diarrhea and vomiting.  Endocrine: Negative for polydipsia and polyuria.  Genitourinary:  Negative for dysuria, frequency, genital sores, vaginal bleeding and vaginal discharge.  Musculoskeletal:  Negative for arthralgias, gait problem and joint swelling.  Skin:  Negative for color change and rash.  Neurological:  Negative for dizziness, tremors, light-headedness and headaches.  Hematological:  Negative for  adenopathy. Does not bruise/bleed easily.  Psychiatric/Behavioral:  Negative for dysphoric mood and sleep disturbance. The patient is not nervous/anxious.    Patient Active Problem List   Diagnosis Date Noted   Carpal tunnel syndrome of right wrist 01/18/2021   Shoulder tendonitis, right 01/18/2021   OSA on CPAP 07/07/2020   Environmental and seasonal allergies 07/07/2020   Encounter for screening colonoscopy    Polyp of colon    Allergic rhinitis 08/22/2012   Hyperlipidemia associated with type 2 diabetes mellitus ([place])    Type II diabetes mellitus with complication ([place])    Essential hypertension    Adjustment reaction with anxiety and depression    ADD (attention deficit disorder)     Allergies  Allergen Reactions   Sulfa Antibiotics Anaphylaxis   Doxycycline     rash   Lisinopril     cough   Cefzil [Cefprozil] Rash    Past Surgical History:  Procedure Laterality Date   COLONOSCOPY WITH PROPOFOL N/A 11/27/2019   Procedure: COLONOSCOPY WITH PROPOFOL;  Surgeon: Virgel Manifold, MD;  Location: [facility] ENDOSCOPY;  Service: Endoscopy;  Laterality: N/A;   none      Social History   Tobacco Use   Smoking status: Never   Smokeless tobacco: Never  Vaping Use   Vaping Use: Never used  Substance Use Topics   Alcohol use: Not Currently    Alcohol/week: 0.0 standard drinks    Comment: rarely   Drug use: No     Medication list has been reviewed and updated.  Current Meds  Medication Sig   albuterol (PROVENTIL HFA;VENTOLIN HFA) 108 (90 BASE) MCG/ACT inhaler Inhale 2 puffs into the lungs every 4 (four) hours as needed for wheezing.    amLODipine (NORVASC) 10 MG tablet TAKE 1 TABLET(10 MG) BY MOUTH DAILY   dapagliflozin propanediol (FARXIGA) 10 MG TABS tablet Take 1 tablet (10 mg total) by mouth daily before breakfast.   FLUTICASONE PROPIONATE, NASAL, NA    glucose blood (TRUE METRIX BLOOD GLUCOSE TEST) test strip Dx E11.9   levocetirizine (XYZAL) 5 MG tablet TAKE 1  TABLET BY MOUTH EVERY EVENING   losartan (COZAAR) 100 MG tablet TAKE 1 TABLET BY MOUTH EVERY DAY   metFORMIN (GLUCOPHAGE) 1000 MG tablet TAKE 1 TABLET(1000 MG) BY MOUTH TWICE DAILY WITH A MEAL   montelukast (SINGULAIR) 10 MG tablet TAKE 1 TABLET BY MOUTH EVERY DAY   Multiple Vitamin (MULTIVITAMIN) tablet Take 1 tablet by mouth daily.   Omega-3 Fatty Acids (FISH OIL TRIPLE STRENGTH) 1400 MG CAPS Take by mouth 2 (two) times daily.   rosuvastatin (CRESTOR) 5 MG tablet TAKE 1 TABLET(5 MG) BY MOUTH AT BEDTIME   sertraline (ZOLOFT) 50 MG tablet Take 50 mg by mouth daily.   VYVANSE 30 MG capsule Take 30 mg by mouth every morning.    PHQ 2/9 Scores 03/10/2021 01/18/2021 10/21/2020 07/07/2020  PHQ - 2 Score 0 0 0 0  PHQ- 9 Score 3 1 3  0    GAD 7 : Generalized Anxiety Score 03/10/2021 01/18/2021 10/21/2020 07/07/2020  Nervous, Anxious, on Edge 0 0 0 0  Control/stop worrying 0 0 1 0  Worry too much - different things 0 0 0 0  Trouble relaxing 0 0 0 0  Restless 0 0 0 0  Easily annoyed or irritable 0 1 2 0  Afraid - awful might happen 0  0 0 0  Total GAD 7 Score 0 1 3 0  Anxiety Difficulty Not difficult at all - Not difficult at all -    BP Readings from Last 3 Encounters:  03/10/21 110/78  02/12/21 122/70  01/18/21 126/84    Physical Exam Vitals and nursing note reviewed.  Constitutional:      General: She is not in acute distress.    Appearance: She is well-developed.  HENT:     Head: Normocephalic and atraumatic.     Right Ear: Tympanic membrane and ear canal normal.     Left Ear: Tympanic membrane and ear canal normal.     Nose:     Right Sinus: No maxillary sinus tenderness.     Left Sinus: No maxillary sinus tenderness.  Eyes:     General: No scleral icterus.       Right eye: No discharge.        Left eye: No discharge.     Conjunctiva/sclera: Conjunctivae normal.  Neck:     Thyroid: No thyromegaly.     Vascular: No carotid bruit.  Cardiovascular:     Rate and Rhythm: Normal  rate and regular rhythm.     Pulses: Normal pulses.     Heart sounds: Normal heart sounds.  Pulmonary:     Effort: Pulmonary effort is normal. No respiratory distress.     Breath sounds: No wheezing.  Chest:  Breasts:    Right: No mass, nipple discharge, skin change or tenderness.     Left: No mass, nipple discharge, skin change or tenderness.  Abdominal:     General: Bowel sounds are normal.     Palpations: Abdomen is soft.     Tenderness: There is no abdominal tenderness.  Musculoskeletal:     Cervical back: Normal range of motion. No erythema.     Right lower leg: No edema.     Left lower leg: No edema.  Lymphadenopathy:     Cervical: No cervical adenopathy.  Skin:    General: Skin is warm and dry.     Findings: No rash.  Neurological:     Mental Status: She is alert and oriented to person, place, and time.     Cranial Nerves: No cranial nerve deficit.     Sensory: No sensory deficit.     Deep Tendon Reflexes: Reflexes are normal and symmetric.  Psychiatric:        Attention and Perception: Attention normal.        Mood and Affect: Mood normal.    Wt Readings from Last 3 Encounters:  03/10/21 216 lb (98 kg)  02/12/21 216 lb (98 kg)  01/18/21 213 lb (96.6 kg)    BP 110/78   Pulse 81   Ht 5\' 3"  (1.6 m)   Wt 216 lb (98 kg)   LMP 01/17/2020 (Approximate)   SpO2 97%   BMI 38.26 kg/m   Assessment and Plan: 1. Annual physical exam Exam is normal except for weight. Encourage regular exercise and appropriate dietary changes. Up to date on screenings and immunizations.  2. Encounter for screening mammogram for breast cancer Schedule in January - MM 3D SCREEN BREAST BILATERAL  3. Essential hypertension Clinically stable exam with well controlled BP. Tolerating medications without side effects at this time. Pt to continue current regimen and low sodium diet; benefits of regular exercise as able discussed. - CBC with Differential/Platelet - POCT urinalysis  dipstick - TSH  4. Type II diabetes mellitus with complication (HCC) Clinically  stable by exam and report without s/s of hypoglycemia. DM complicated by hypertension and dyslipidemia. Tolerating medications well without side effects or other concerns. Could not tolerate Rybelsus even at low dose. Consider adding glipizide or basal insulin if worsening. - Comprehensive metabolic panel - Hemoglobin A1c - POCT urinalysis dipstick - POCT UA - Microalbumin  5. Hyperlipidemia associated with type 2 diabetes mellitus ([place]) Tolerating statin medication without side effects at this time LDL is not at goal of < 70 on current dose - may increase to 10 mg if indicated. Continue same therapy without change at this time. - Lipid panel   Partially dictated using Editor, commissioning. Any errors are unintentional.  Halina Maidens, MD [facility]  03/10/2021

## 2021-03-11 LAB — HEMOGLOBIN A1C
Est. average glucose Bld gHb Est-mCnc: 200 mg/dL
Hgb A1c MFr Bld: 8.6 % — ABNORMAL HIGH (ref 4.8–5.6)

## 2021-03-11 LAB — LIPID PANEL
Chol/HDL Ratio: 4.8 ratio — ABNORMAL HIGH (ref 0.0–4.4)
Cholesterol, Total: 203 mg/dL — ABNORMAL HIGH (ref 100–199)
HDL: 42 mg/dL (ref >39)
LDL Chol Calc (NIH): 109 mg/dL — ABNORMAL HIGH (ref 0–99)
Triglycerides: 302 mg/dL — ABNORMAL HIGH (ref 0–149)
VLDL Cholesterol Cal: 52 mg/dL — ABNORMAL HIGH (ref 5–40)

## 2021-03-11 LAB — CBC WITH DIFFERENTIAL/PLATELET
Basophils Absolute: 0.1 10*3/uL (ref 0.0–0.2)
Basos: 1 %
EOS (ABSOLUTE): 0.8 10*3/uL — ABNORMAL HIGH (ref 0.0–0.4)
Eos: 10 %
Hematocrit: 42.5 % (ref 34.0–46.6)
Hemoglobin: 14.2 g/dL (ref 11.1–15.9)
Immature Grans (Abs): 0 10*3/uL (ref 0.0–0.1)
Immature Granulocytes: 0 %
Lymphocytes Absolute: 2.4 10*3/uL (ref 0.7–3.1)
Lymphs: 29 %
MCH: 29.5 pg (ref 26.6–33.0)
MCHC: 33.4 g/dL (ref 31.5–35.7)
MCV: 88 fL (ref 79–97)
Monocytes Absolute: 0.6 10*3/uL (ref 0.1–0.9)
Monocytes: 7 %
Neutrophils Absolute: 4.2 10*3/uL (ref 1.4–7.0)
Neutrophils: 53 %
Platelets: 230 10*3/uL (ref 150–450)
RBC: 4.81 x10E6/uL (ref 3.77–5.28)
RDW: 12.6 % (ref 11.7–15.4)
WBC: 8.1 10*3/uL (ref 3.4–10.8)

## 2021-03-11 LAB — COMPREHENSIVE METABOLIC PANEL
ALT: 47 IU/L — ABNORMAL HIGH (ref 0–32)
AST: 22 IU/L (ref 0–40)
Albumin/Globulin Ratio: 2.3 — ABNORMAL HIGH (ref 1.2–2.2)
Albumin: 4.6 g/dL (ref 3.8–4.9)
Alkaline Phosphatase: 75 IU/L (ref 44–121)
BUN/Creatinine Ratio: 30 — ABNORMAL HIGH (ref 9–23)
BUN: 26 mg/dL — ABNORMAL HIGH (ref 6–24)
Bilirubin Total: <0.2 mg/dL (ref 0.0–1.2)
CO2: 22 mmol/L (ref 20–29)
Calcium: 9.3 mg/dL (ref 8.7–10.2)
Chloride: 98 mmol/L (ref 96–106)
Creatinine, Ser: 0.87 mg/dL (ref 0.57–1.00)
Globulin, Total: 2 g/dL (ref 1.5–4.5)
Glucose: 200 mg/dL — ABNORMAL HIGH (ref 70–99)
Potassium: 4.5 mmol/L (ref 3.5–5.2)
Sodium: 134 mmol/L (ref 134–144)
Total Protein: 6.6 g/dL (ref 6.0–8.5)
eGFR: 81 mL/min/{1.73_m2} (ref >59)

## 2021-03-11 LAB — TSH: TSH: 1.9 u[IU]/mL (ref 0.450–4.500)

## 2021-03-16 ENCOUNTER — Other Ambulatory Visit: Payer: Self-pay

## 2021-03-16 ENCOUNTER — Other Ambulatory Visit: Payer: Self-pay | Admitting: Internal Medicine

## 2021-03-16 ENCOUNTER — Encounter: Payer: Self-pay | Admitting: Internal Medicine

## 2021-03-16 ENCOUNTER — Ambulatory Visit (INDEPENDENT_AMBULATORY_CARE_PROVIDER_SITE_OTHER): Payer: 59

## 2021-03-16 DIAGNOSIS — E1169 Type 2 diabetes mellitus with other specified complication: Secondary | ICD-10-CM

## 2021-03-16 DIAGNOSIS — R072 Precordial pain: Secondary | ICD-10-CM

## 2021-03-16 DIAGNOSIS — E785 Hyperlipidemia, unspecified: Secondary | ICD-10-CM

## 2021-03-16 DIAGNOSIS — E118 Type 2 diabetes mellitus with unspecified complications: Secondary | ICD-10-CM

## 2021-03-16 LAB — ECHOCARDIOGRAM COMPLETE
AR max vel: 1.93 cm2
AV Area VTI: 2.14 cm2
AV Area mean vel: 2.23 cm2
AV Mean grad: 4 mmHg
AV Peak grad: 7.1 mmHg
Ao pk vel: 1.33 m/s
Area-P 1/2: 2.73 cm2
Calc EF: 54.8 %
S' Lateral: 2.5 cm
Single Plane A2C EF: 57 %
Single Plane A4C EF: 54.7 %

## 2021-03-16 MED ORDER — PEN NEEDLES 32G X 5 MM MISC: 1.0000 | Freq: Every day | 3 refills | Status: DC

## 2021-03-16 MED ORDER — ROSUVASTATIN CALCIUM 10 MG PO TABS: 10.0000 mg | ORAL_TABLET | Freq: Every day | ORAL | 1 refills | Status: DC

## 2021-03-16 MED ORDER — INSULIN GLARGINE 100 UNIT/ML SOLOSTAR PEN: 10.0000 [IU] | PEN_INJECTOR | Freq: Every morning | SUBCUTANEOUS | 1 refills | Status: DC

## 2021-03-31 ENCOUNTER — Telehealth: Payer: Self-pay

## 2021-03-31 NOTE — Telephone Encounter (Signed)
Called pt left VM as a reminder to call and schedule mammogram. ° °PEC nurse may give results to patient if they return call to clinic, a CRM has been created. ° °KP °

## 2021-04-04 ENCOUNTER — Other Ambulatory Visit: Payer: Self-pay | Admitting: Internal Medicine

## 2021-04-05 NOTE — Telephone Encounter (Signed)
Requested Prescriptions  Pending Prescriptions Disp Refills   montelukast (SINGULAIR) 10 MG tablet [Pharmacy Med Name: MONTELUKAST 10MG  TABLETS] 90 tablet 1    Sig: TAKE 1 TABLET BY MOUTH EVERY DAY     There is no refill protocol information for this order

## 2021-04-13 ENCOUNTER — Encounter: Payer: Self-pay | Admitting: Cardiology

## 2021-04-13 ENCOUNTER — Other Ambulatory Visit: Payer: Self-pay

## 2021-04-13 ENCOUNTER — Ambulatory Visit: Payer: 59 | Admitting: Cardiology

## 2021-04-13 VITALS — BP 118/60 | HR 93 | Ht 63.0 in | Wt 219.5 lb

## 2021-04-13 DIAGNOSIS — R072 Precordial pain: Secondary | ICD-10-CM | POA: Diagnosis not present

## 2021-04-13 DIAGNOSIS — I10 Essential (primary) hypertension: Secondary | ICD-10-CM

## 2021-04-13 DIAGNOSIS — E7800 Pure hypercholesterolemia, unspecified: Secondary | ICD-10-CM | POA: Diagnosis not present

## 2021-04-13 NOTE — Progress Notes (Signed)
Cardiology Office Note:    Date:  04/13/2021   ID:  Samantha Moses, DOB [birth year], MRN [id number]  PCP:  Glean Hess, MD   [place] Providers Cardiologist:  None     Referring MD: Glean Hess, MD   Chief Complaint  Patient presents with   Other    2 month f/u no complaints today. Meds reviewed verbally with pt.     History of Present Illness:    Samantha Moses is a 50 y.o. female with a hx of hypertension, hyperlipidemia, diabetes, ADHD who presents for follow-up.  She was last seen due to chest pain.  Symptoms of cord in the setting of moving boxes.  Chest discomfort was right-sided.  She denies any symptoms with exertion, symptoms have since resolved.  She wanted to get checked out due to family history of CAD in her father around age 80.  Father was also a smoker.  She otherwise feels well, has no concerns at this time.   Past Medical History:  Diagnosis Date   ADD (attention deficit disorder)    Depression    Diabetes mellitus without complication ([place])    Hyperlipidemia    Hypertension     Past Surgical History:  Procedure Laterality Date   COLONOSCOPY WITH PROPOFOL N/A 11/27/2019   Procedure: COLONOSCOPY WITH PROPOFOL;  Surgeon: Virgel Manifold, MD;  Location: [facility] ENDOSCOPY;  Service: Endoscopy;  Laterality: N/A;   none      Current Medications: Current Meds  Medication Sig   albuterol (PROVENTIL HFA;VENTOLIN HFA) 108 (90 BASE) MCG/ACT inhaler Inhale 2 puffs into the lungs every 4 (four) hours as needed for wheezing.    amLODipine (NORVASC) 10 MG tablet TAKE 1 TABLET(10 MG) BY MOUTH DAILY   dapagliflozin propanediol (FARXIGA) 10 MG TABS tablet Take 1 tablet (10 mg total) by mouth daily before breakfast.   FLUTICASONE PROPIONATE, NASAL, NA as needed.   glucose blood (TRUE METRIX BLOOD GLUCOSE TEST) test strip Dx E11.9   insulin glargine (LANTUS) 100 UNIT/ML Solostar Pen Inject 10 Units into the skin in the morning.   Insulin Pen Needle  (PEN NEEDLES) 32G X 5 MM MISC 1 each by Does not apply route daily at 6 (six) AM.   levocetirizine (XYZAL) 5 MG tablet TAKE 1 TABLET BY MOUTH EVERY EVENING   losartan (COZAAR) 100 MG tablet TAKE 1 TABLET BY MOUTH EVERY DAY   metFORMIN (GLUCOPHAGE) 1000 MG tablet TAKE 1 TABLET(1000 MG) BY MOUTH TWICE DAILY WITH A MEAL   montelukast (SINGULAIR) 10 MG tablet TAKE 1 TABLET BY MOUTH EVERY DAY   Multiple Vitamin (MULTIVITAMIN) tablet Take 1 tablet by mouth daily.   Omega-3 Fatty Acids (FISH OIL TRIPLE STRENGTH) 1400 MG CAPS Take by mouth 2 (two) times daily.   rosuvastatin (CRESTOR) 10 MG tablet Take 1 tablet (10 mg total) by mouth daily.   sertraline (ZOLOFT) 50 MG tablet Take 50 mg by mouth daily.   VYVANSE 30 MG capsule Take 30 mg by mouth every morning.     Allergies:   Sulfa antibiotics, Doxycycline, Lisinopril, and Cefzil [cefprozil]   Social History   Socioeconomic History   Marital status: Single    Spouse name: Not on file   Number of children: Not on file   Years of education: Not on file   Highest education level: Not on file  Occupational History   Occupation: pharmacy tech  Tobacco Use   Smoking status: Never   Smokeless tobacco: Never  Vaping  Use   Vaping Use: Never used  Substance and Sexual Activity   Alcohol use: Not Currently    Alcohol/week: 0.0 standard drinks    Comment: rarely   Drug use: No   Sexual activity: Not Currently    Partners: Male  Other Topics Concern   Not on file  Social History Narrative   Not on file   Social Determinants of Health   Financial Resource Strain: Not on file  Food Insecurity: Not on file  Transportation Needs: Not on file  Physical Activity: Not on file  Stress: Not on file  Social Connections: Not on file     Family History: The patient's family history includes Breast cancer in her cousin and other family members; Cancer (age of onset: 54) in her maternal aunt; Diabetes in her father; Hyperlipidemia in her father and  mother; Hypertension in her father and mother.  ROS:   Please see the history of present illness.     All other systems reviewed and are negative.  EKGs/Labs/Other Studies Reviewed:    The following studies were reviewed today:   EKG:  EKG not ordered today.   Recent Labs: 03/10/2021: ALT 47; BUN 26; Creatinine, Ser 0.87; Hemoglobin 14.2; Platelets 230; Potassium 4.5; Sodium 134; TSH 1.900  Recent Lipid Panel    Component Value Date/Time   CHOL 203 (H) 03/10/2021 0922   TRIG 302 (H) 03/10/2021 0922   HDL 42 03/10/2021 0922   CHOLHDL 4.8 (H) 03/10/2021 0922   CHOLHDL 5 06/11/2018 0827   VLDL 63.6 (H) 06/11/2018 0827   LDLCALC 109 (H) 03/10/2021 0922   LDLDIRECT 151.0 06/11/2018 0827     Risk Assessment/Calculations:         Physical Exam:    VS:  BP 118/60 (BP Location: Left Arm, Patient Position: Sitting, Cuff Size: Normal)    Pulse 93    Ht 5\' 3"  (1.6 m)    Wt 219 lb 8 oz (99.6 kg)    LMP 01/17/2020 (Approximate)    SpO2 97%    BMI 38.88 kg/m     Wt Readings from Last 3 Encounters:  04/13/21 219 lb 8 oz (99.6 kg)  03/10/21 216 lb (98 kg)  02/12/21 216 lb (98 kg)     GEN:  Well nourished, well developed in no acute distress HEENT: Normal NECK: No JVD; No carotid bruits CARDIAC: RRR, no murmurs, rubs, gallops RESPIRATORY:  Clear to auscultation without rales, wheezing or rhonchi  ABDOMEN: Soft, non-tender, non-distended MUSCULOSKELETAL:  No edema; No deformity  SKIN: Warm and dry NEUROLOGIC:  Alert and oriented x 3 PSYCHIATRIC:  Normal affect   ASSESSMENT:    1. Precordial pain   2. Primary hypertension   3. Pure hypercholesterolemia     PLAN:    In order of problems listed above:  Chest pain, likely musculoskeletal in etiology.  Echocardiogram showed normal systolic function, EF 60 to 65%, no findings to suggest cardiac etiology of chest pain. Hypertension, BP controlled.  Continue losartan, Norvasc. Hyperlipidemia, continue Crestor.  follow up as  needed    Medication Adjustments/Labs and Tests Ordered: Current medicines are reviewed at length with the patient today.  Concerns regarding medicines are outlined above.  No orders of the defined types were placed in this encounter.   No orders of the defined types were placed in this encounter.    Patient Instructions  Medication Instructions:  Your physician recommends that you continue on your current medications as directed. Please refer to the Current  Medication list given to you today.   *If you need a refill on your cardiac medications before your next appointment, please call your pharmacy*   Lab Work: None ordered  If you have labs (blood work) drawn today and your tests are completely normal, you will receive your results only by: [place] (if you have MyChart) OR A paper copy in the mail If you have any lab test that is abnormal or we need to change your treatment, we will call you to review the results.   Testing/Procedures: None ordered   Follow-Up: At [facility], you and your health needs are our priority.  As part of our continuing mission to provide you with exceptional heart care, we have created designated Provider Care Teams.  These Care Teams include your primary Cardiologist (physician) and Advanced Practice Providers (APPs -  Physician Assistants and Nurse Practitioners) who all work together to provide you with the care you need, when you need it.  We recommend signing up for the patient portal called "MyChart".  Sign up information is provided on this After Visit Summary.  MyChart is used to connect with patients for Virtual Visits (Telemedicine).  Patients are able to view lab/test results, encounter notes, upcoming appointments, etc.  Non-urgent messages can be sent to your provider as well.   To learn more about what you can do with MyChart, go to [email or web address].    Your next appointment:   As needed  The format for your next  appointment:   In Person  Provider:   You may see Kate Sable, MD or one of the following Advanced Practice Providers on your designated Care Team:   Murray Hodgkins, NP Christell Faith, PA-C Cadence Kathlen Mody, PA-C  :1}    Other Instructions N/A    Signed, Kate Sable, MD  04/13/2021 4:36 PM    [place]

## 2021-04-13 NOTE — Patient Instructions (Signed)
Medication Instructions:  Your physician recommends that you continue on your current medications as directed. Please refer to the Current Medication list given to you today.   *If you need a refill on your cardiac medications before your next appointment, please call your pharmacy*   Lab Work: None ordered  If you have labs (blood work) drawn today and your tests are completely normal, you will receive your results only by: [place] (if you have MyChart) OR A paper copy in the mail If you have any lab test that is abnormal or we need to change your treatment, we will call you to review the results.   Testing/Procedures: None ordered   Follow-Up: At [facility], you and your health needs are our priority.  As part of our continuing mission to provide you with exceptional heart care, we have created designated Provider Care Teams.  These Care Teams include your primary Cardiologist (physician) and Advanced Practice Providers (APPs -  Physician Assistants and Nurse Practitioners) who all work together to provide you with the care you need, when you need it.  We recommend signing up for the patient portal called "MyChart".  Sign up information is provided on this After Visit Summary.  MyChart is used to connect with patients for Virtual Visits (Telemedicine).  Patients are able to view lab/test results, encounter notes, upcoming appointments, etc.  Non-urgent messages can be sent to your provider as well.   To learn more about what you can do with MyChart, go to [email or web address].    Your next appointment:   As needed  The format for your next appointment:   In Person  Provider:   You may see Kate Sable, MD or one of the following Advanced Practice Providers on your designated Care Team:   Murray Hodgkins, NP Christell Faith, PA-C Cadence Kathlen Mody, PA-C  :1}    Other Instructions N/A

## 2021-04-16 ENCOUNTER — Other Ambulatory Visit: Payer: Self-pay | Admitting: Internal Medicine

## 2021-04-16 DIAGNOSIS — E118 Type 2 diabetes mellitus with unspecified complications: Secondary | ICD-10-CM

## 2021-04-16 NOTE — Telephone Encounter (Signed)
Requested Prescriptions  Pending Prescriptions Disp Refills   FARXIGA 10 MG TABS tablet [Pharmacy Med Name: FARXIGA 10MG TABLETS] 90 tablet 0    Sig: TAKE 1 TABLET(10 MG) BY MOUTH DAILY BEFORE AND BREAKFAST     Endocrinology:  Diabetes - SGLT2 Inhibitors Failed - 04/16/2021  1:12 PM      Failed - LDL in normal range and within 360 days    LDL Chol Calc (NIH)  Date Value Ref Range Status  03/10/2021 109 (H) 0 - 99 mg/dL Final   Direct LDL  Date Value Ref Range Status  06/11/2018 151.0 mg/dL Final    Comment:    Optimal:  <100 mg/dLNear or Above Optimal:  100-129 mg/dLBorderline High:  130-159 mg/dLHigh:  160-189 mg/dLVery High:  >190 mg/dL         Failed - HBA1C is between 0 and 7.9 and within 180 days    HbA1c POC (<> result, manual entry)  Date Value Ref Range Status  06/11/2018 9.5 4.0 - 5.6 % Final   Hgb A1c MFr Bld  Date Value Ref Range Status  03/10/2021 8.6 (H) 4.8 - 5.6 % Final    Comment:             Prediabetes: 5.7 - 6.4          Diabetes: >6.4          Glycemic control for adults with diabetes: <7.0          Passed - Cr in normal range and within 360 days    Creatinine, Ser  Date Value Ref Range Status  03/10/2021 0.87 0.57 - 1.00 mg/dL Final   Creatinine,U  Date Value Ref Range Status  06/11/2018 68.1 mg/dL Final         Passed - eGFR in normal range and within 360 days    GFR calc Af Amer  Date Value Ref Range Status  03/04/2020 83 >59 mL/min/1.73 Final    Comment:    **In accordance with recommendations from the NKF-ASN Task force,**   [facility] is in the process of updating its eGFR calculation to the   2021 CKD-EPI creatinine equation that estimates kidney function   without a race variable.    GFR, Estimated  Date Value Ref Range Status  01/04/2021 >60 >60 mL/min Final    Comment:    (NOTE) Calculated using the CKD-EPI Creatinine Equation (2021)    GFR  Date Value Ref Range Status  06/11/2018 60.41 >60.00 mL/min Final   eGFR  Date  Value Ref Range Status  03/10/2021 81 >59 mL/min/1.73 Final         Passed - Valid encounter within last 6 months    Recent Outpatient Visits          1 month ago Annual physical exam   [facility] Glean Hess, [facility]   2 months ago Carpal tunnel syndrome of right wrist   [facility] Glean Hess, MD   5 months ago Type II diabetes mellitus with complication [place])   [facility] Glean Hess, MD   9 months ago Type II diabetes mellitus with complication [facility])   [facility] Glean Hess, MD   1 year ago Annual physical exam   [facility] Glean Hess, MD      Future Appointments            In 2 months Army Melia Jesse Sans, MD [facility], [place]   In  11 months Glean Hess, MD [place]

## 2021-05-18 ENCOUNTER — Other Ambulatory Visit: Payer: Self-pay | Admitting: Internal Medicine

## 2021-05-18 DIAGNOSIS — E1169 Type 2 diabetes mellitus with other specified complication: Secondary | ICD-10-CM

## 2021-05-18 DIAGNOSIS — E118 Type 2 diabetes mellitus with unspecified complications: Secondary | ICD-10-CM

## 2021-05-18 DIAGNOSIS — I10 Essential (primary) hypertension: Secondary | ICD-10-CM

## 2021-05-18 DIAGNOSIS — E785 Hyperlipidemia, unspecified: Secondary | ICD-10-CM

## 2021-05-18 NOTE — Telephone Encounter (Signed)
Requested Prescriptions  Pending Prescriptions Disp Refills   rosuvastatin (CRESTOR) 5 MG tablet [Pharmacy Med Name: ROSUVASTATIN 5MG TABLETS] 90 tablet 1    Sig: TAKE 1 TABLET(5 MG) BY MOUTH AT BEDTIME     Cardiovascular:  Antilipid - Statins Failed - 05/18/2021  6:30 AM      Failed - Total Cholesterol in normal range and within 360 days    Cholesterol, Total  Date Value Ref Range Status  03/10/2021 203 (H) 100 - 199 mg/dL Final         Failed - LDL in normal range and within 360 days    LDL Chol Calc (NIH)  Date Value Ref Range Status  03/10/2021 109 (H) 0 - 99 mg/dL Final   Direct LDL  Date Value Ref Range Status  06/11/2018 151.0 mg/dL Final    Comment:    Optimal:  <100 mg/dLNear or Above Optimal:  100-129 mg/dLBorderline High:  130-159 mg/dLHigh:  160-189 mg/dLVery High:  >190 mg/dL         Failed - Triglycerides in normal range and within 360 days    Triglycerides  Date Value Ref Range Status  03/10/2021 302 (H) 0 - 149 mg/dL Final         Passed - HDL in normal range and within 360 days    HDL  Date Value Ref Range Status  03/10/2021 42 >39 mg/dL Final         Passed - Patient is not pregnant      Passed - Valid encounter within last 12 months    Recent Outpatient Visits          2 months ago Annual physical exam   [facility] Glean Hess, MD   4 months ago Carpal tunnel syndrome of right wrist   [facility] Glean Hess, MD   6 months ago Type II diabetes mellitus with complication [facility])   [facility] Glean Hess, MD   10 months ago Type II diabetes mellitus with complication [facility])   [facility] Glean Hess, MD   1 year ago Annual physical exam   [facility] Glean Hess, MD      Future Appointments            In 1 month Army Melia Jesse Sans, MD [facility], [place]   In 10 months Glean Hess, MD [facility]            metFORMIN (GLUCOPHAGE)  1000 MG tablet [Pharmacy Med Name: METFORMIN 1000MG TABLETS] 180 tablet 1    Sig: TAKE 1 TABLET(1000 MG) BY MOUTH TWICE DAILY WITH A MEAL     Endocrinology:  Diabetes - Biguanides Failed - 05/18/2021  6:30 AM      Failed - HBA1C is between 0 and 7.9 and within 180 days    HbA1c POC (<> result, manual entry)  Date Value Ref Range Status  06/11/2018 9.5 4.0 - 5.6 % Final   Hgb A1c MFr Bld  Date Value Ref Range Status  03/10/2021 8.6 (H) 4.8 - 5.6 % Final    Comment:             Prediabetes: 5.7 - 6.4          Diabetes: >6.4          Glycemic control for adults with diabetes: <7.0          Passed - Cr in normal range and within 360  days    Creatinine, Ser  Date Value Ref Range Status  03/10/2021 0.87 0.57 - 1.00 mg/dL Final   Creatinine,U  Date Value Ref Range Status  06/11/2018 68.1 mg/dL Final         Passed - eGFR in normal range and within 360 days    GFR calc Af Amer  Date Value Ref Range Status  03/04/2020 83 >59 mL/min/1.73 Final    Comment:    **In accordance with recommendations from the NKF-ASN Task force,**   [facility] is in the process of updating its eGFR calculation to the   2021 CKD-EPI creatinine equation that estimates kidney function   without a race variable.    GFR, Estimated  Date Value Ref Range Status  01/04/2021 >60 >60 mL/min Final    Comment:    (NOTE) Calculated using the CKD-EPI Creatinine Equation (2021)    GFR  Date Value Ref Range Status  06/11/2018 60.41 >60.00 mL/min Final   eGFR  Date Value Ref Range Status  03/10/2021 81 >59 mL/min/1.73 Final         Passed - Valid encounter within last 6 months    Recent Outpatient Visits          2 months ago Annual physical exam   [facility] Glean Hess, MD   4 months ago Carpal tunnel syndrome of right wrist   [facility] Glean Hess, MD   6 months ago Type II diabetes mellitus with complication [facility])   [facility] Glean Hess, MD   10  months ago Type II diabetes mellitus with complication [facility])   [facility] Glean Hess, MD   1 year ago Annual physical exam   [facility] Glean Hess, MD      Future Appointments            In 1 month Army Melia Jesse Sans, MD [facility], [place]   In 10 months Glean Hess, MD [facility]            amLODipine ([place]) 10 MG tablet [Pharmacy Med Name: AMLODIPINE BESYLATE 10MG TABLETS] 90 tablet 0    Sig: TAKE 1 TABLET(10 MG) BY MOUTH DAILY     Cardiovascular:  Calcium Channel Blockers Passed - 05/18/2021  6:30 AM      Passed - Last BP in normal range    BP Readings from Last 1 Encounters:  04/13/21 118/60         Passed - Valid encounter within last 6 months    Recent Outpatient Visits          2 months ago Annual physical exam   [facility] Glean Hess, MD   4 months ago Carpal tunnel syndrome of right wrist   [facility] Glean Hess, MD   6 months ago Type II diabetes mellitus with complication [facility])   [facility] Glean Hess, MD   10 months ago Type II diabetes mellitus with complication [facility])   [facility] Glean Hess, MD   1 year ago Annual physical exam   [facility] Glean Hess, MD      Future Appointments            In 1 month Army Melia Jesse Sans, MD [facility], [place]   In 10 months Army Melia Jesse Sans, MD [facility], [facility]  losartan (COZAAR) 100 MG tablet [Pharmacy Med Name: LOSARTAN 100MG TABLETS] 90 tablet 1    Sig: TAKE 1 TABLET BY MOUTH EVERY DAY     Cardiovascular:  Angiotensin Receptor Blockers Passed - 05/18/2021  6:30 AM      Passed - Cr in normal range and within 180 days    Creatinine, Ser  Date Value Ref Range Status  03/10/2021 0.87 0.57 - 1.00 mg/dL Final   Creatinine,U  Date Value Ref Range Status  06/11/2018 68.1 mg/dL Final         Passed - K in normal range and within  180 days    Potassium  Date Value Ref Range Status  03/10/2021 4.5 3.5 - 5.2 mmol/L Final         Passed - Patient is not pregnant      Passed - Last BP in normal range    BP Readings from Last 1 Encounters:  04/13/21 118/60         Passed - Valid encounter within last 6 months    Recent Outpatient Visits          2 months ago Annual physical exam   [facility] Glean Hess, MD   4 months ago Carpal tunnel syndrome of right wrist   [facility] Glean Hess, MD   6 months ago Type II diabetes mellitus with complication [facility])   [facility] Glean Hess, MD   10 months ago Type II diabetes mellitus with complication [facility])   [facility] Glean Hess, MD   1 year ago Annual physical exam   [facility] Glean Hess, MD      Future Appointments            In 1 month Army Melia Jesse Sans, MD [facility], [place]   In 10 months Army Melia Jesse Sans, MD [facility], [facility]

## 2021-05-18 NOTE — Telephone Encounter (Signed)
Requested medications are due for refill today.  no  Requested medications are on the active medications list.  no  Last refill. 11/19/2020  Future visit scheduled.   yes  Notes to clinic.  Dosage changed to 10 MG 03/16/2021.    Requested Prescriptions  Pending Prescriptions Disp Refills   rosuvastatin (CRESTOR) 5 MG tablet [Pharmacy Med Name: ROSUVASTATIN 5MG TABLETS] 90 tablet 1    Sig: TAKE 1 TABLET(5 MG) BY MOUTH AT BEDTIME     Cardiovascular:  Antilipid - Statins Failed - 05/18/2021  6:30 AM      Failed - Total Cholesterol in normal range and within 360 days    Cholesterol, Total  Date Value Ref Range Status  03/10/2021 203 (H) 100 - 199 mg/dL Final          Failed - LDL in normal range and within 360 days    LDL Chol Calc (NIH)  Date Value Ref Range Status  03/10/2021 109 (H) 0 - 99 mg/dL Final   Direct LDL  Date Value Ref Range Status  06/11/2018 151.0 mg/dL Final    Comment:    Optimal:  <100 mg/dLNear or Above Optimal:  100-129 mg/dLBorderline High:  130-159 mg/dLHigh:  160-189 mg/dLVery High:  >190 mg/dL          Failed - Triglycerides in normal range and within 360 days    Triglycerides  Date Value Ref Range Status  03/10/2021 302 (H) 0 - 149 mg/dL Final          Passed - HDL in normal range and within 360 days    HDL  Date Value Ref Range Status  03/10/2021 42 >39 mg/dL Final          Passed - Patient is not pregnant      Passed - Valid encounter within last 12 months    Recent Outpatient Visits           2 months ago Annual physical exam   [facility], [facility]   4 months ago Carpal tunnel syndrome of right wrist   [facility] Glean [facility], [facility]   6 months ago Type II diabetes mellitus with complication [facility])   [facility]   10 months ago Type II diabetes mellitus with complication [facility])   [facility] Glean [facility], [facility]   1 year ago Annual physical exam    [facility] Glean Hess, MD       Future Appointments             In 1 month Glean Hess, MD [facility]   In 10 months Glean Hess, MD [facility]            Signed Prescriptions Disp Refills   metFORMIN (GLUCOPHAGE) 1000 MG tablet 180 tablet 1    Sig: TAKE 1 TABLET(1000 MG) BY MOUTH TWICE DAILY WITH A MEAL     Endocrinology:  Diabetes - Biguanides Failed - 05/18/2021  6:30 AM      Failed - HBA1C is between 0 and 7.9 and within 180 days    HbA1c POC (<> result, manual entry)  Date Value Ref Range Status  06/11/2018 9.5 4.0 - 5.6 % Final   Hgb A1c MFr Bld  Date Value Ref Range Status  03/10/2021 8.6 (H) 4.8 - 5.6 % Final    Comment:  Prediabetes: 5.7 - 6.4          Diabetes: >6.4          Glycemic control for adults with diabetes: <7.0           Passed - Cr in normal range and within 360 days    Creatinine, Ser  Date Value Ref Range Status  03/10/2021 0.87 0.57 - 1.00 mg/dL Final   Creatinine,U  Date Value Ref Range Status  06/11/2018 68.1 mg/dL Final          Passed - eGFR in normal range and within 360 days    GFR calc Af Amer  Date Value Ref Range Status  03/04/2020 83 >59 mL/min/1.73 Final    Comment:    **In accordance with recommendations from the NKF-ASN Task force,**   [facility] is in the process of updating its eGFR calculation to the   2021 CKD-EPI creatinine equation that estimates kidney function   without a race variable.    GFR, Estimated  Date Value Ref Range Status  01/04/2021 >60 >60 mL/min Final    Comment:    (NOTE) Calculated using the CKD-EPI Creatinine Equation (2021)    GFR  Date Value Ref Range Status  06/11/2018 60.41 >60.00 mL/min Final   eGFR  Date Value Ref Range Status  03/10/2021 81 >59 mL/min/1.73 Final          Passed - Valid encounter within last 6 months    Recent Outpatient Visits           2 months ago Annual physical exam   [facility] Glean [facility], [facility]   4 months ago Carpal tunnel syndrome of right wrist   [facility] Glean [facility], MD   6 months ago Type II diabetes mellitus with complication [facility])   [facility], [facility]   10 months ago Type II diabetes mellitus with complication [facility])   [facility] Glean Hess, MD   1 year ago Annual physical exam   [facility] Glean Hess, MD       Future Appointments             In 1 month Army Melia Jesse Sans, MD [facility], [place]   In 10 months Glean Hess, MD [facility]             amLODipine (NORVASC) 10 MG tablet 90 tablet 0    Sig: TAKE 1 TABLET(10 MG) BY MOUTH DAILY     Cardiovascular:  Calcium Channel Blockers Passed - 05/18/2021  6:30 AM      Passed - Last BP in normal range    BP Readings from Last 1 Encounters:  04/13/21 118/60          Passed - Valid encounter within last 6 months    Recent Outpatient Visits           2 months ago Annual physical exam   [facility] Gle[facility], [facility]   4 months ago Carpal tunnel syndrome of right wrist   [facility] Glean Hess, MD   6 months ago Type II diabetes mellitus with complication [facility])   [facility], [facility]   10 months ago Type II diabetes mellitus with complication [facility])   [facility] Glean Hess, [facility]   1 year ago Annual physical exam   [facility] Glean Hess, MD  Future Appointments             In 1 month Army Melia, Jesse Sans, MD [facility], [place]   In 10 months Glean Hess, MD [facility]             losartan (COZAAR) 100 MG tablet 90 tablet 1    Sig: TAKE 1 TABLET BY MOUTH EVERY DAY     Cardiovascular:  Angiotensin Receptor Blockers Passed - 05/18/2021  6:30 AM      Passed - Cr in normal range and within 180 days    Creatinine, Ser  Date Value Ref Range  Status  03/10/2021 0.87 0.57 - 1.00 mg/dL Final   Creatinine,U  Date Value Ref Range Status  06/11/2018 68.1 mg/dL Final          Passed - K in normal range and within 180 days    Potassium  Date Value Ref Range Status  03/10/2021 4.5 3.5 - 5.2 mmol/L Final          Passed - Patient is not pregnant      Passed - Last BP in normal range    BP Readings from Last 1 Encounters:  04/13/21 118/60          Passed - Valid encounter within last 6 months    Recent Outpatient Visits           2 months ago Annual physical exam   [facility], [facility]   4 months ago Carpal tunnel syndrome of right wrist   [facility] Glean Hess, MD   6 months ago Type II diabetes mellitus with complication [facility])   [facility], [facility]   10 months ago Type II diabetes mellitus with complication [facility])   [facility] Glean Hess, MD   1 year ago Annual physical exam   [facility] Glean Hess, MD       Future Appointments             In 1 month Army Melia Jesse Sans, MD [facility], [place]   In 10 months Army Melia Jesse Sans, MD [facility], [facility]

## 2021-06-10 ENCOUNTER — Other Ambulatory Visit: Payer: Self-pay

## 2021-06-10 ENCOUNTER — Ambulatory Visit
Admission: RE | Admit: 2021-06-10 | Discharge: 2021-06-10 | Disposition: A | Discharge status: Home / Self Care | Payer: 59 | Source: Ambulatory Visit | Attending: Internal Medicine | Admitting: Internal Medicine

## 2021-06-10 DIAGNOSIS — Z1231 Encounter for screening mammogram for malignant neoplasm of breast: Secondary | ICD-10-CM | POA: Insufficient documentation

## 2021-07-09 ENCOUNTER — Other Ambulatory Visit: Payer: Self-pay

## 2021-07-09 ENCOUNTER — Encounter: Payer: Self-pay | Admitting: Internal Medicine

## 2021-07-09 ENCOUNTER — Ambulatory Visit (INDEPENDENT_AMBULATORY_CARE_PROVIDER_SITE_OTHER): Payer: 59 | Admitting: Internal Medicine

## 2021-07-09 VITALS — BP 120/74 | HR 95 | Ht 63.0 in | Wt 220.0 lb

## 2021-07-09 DIAGNOSIS — E785 Hyperlipidemia, unspecified: Secondary | ICD-10-CM

## 2021-07-09 DIAGNOSIS — E118 Type 2 diabetes mellitus with unspecified complications: Secondary | ICD-10-CM | POA: Diagnosis not present

## 2021-07-09 DIAGNOSIS — E1169 Type 2 diabetes mellitus with other specified complication: Secondary | ICD-10-CM

## 2021-07-09 DIAGNOSIS — I10 Essential (primary) hypertension: Secondary | ICD-10-CM

## 2021-07-09 NOTE — Progress Notes (Signed)
? ? ?Date:  07/09/2021  ? ?Name:  Samantha Moses   DOB:  1971-02-12   MRN:  [id number] ? ? ?Chief Complaint: Diabetes and Hypertension ? ?Hypertension ?This is a chronic problem. The problem is controlled. Pertinent negatives include no chest pain, headaches, palpitations or shortness of breath. Past treatments include calcium channel blockers and angiotensin blockers.  ?Diabetes ?She presents for her follow-up diabetic visit. She has type 2 diabetes mellitus. Her disease course has been stable. Pertinent negatives for hypoglycemia include no headaches or tremors. Pertinent negatives for diabetes include no chest pain, no fatigue, no polydipsia and no polyuria. Current diabetic treatment includes oral agent (dual therapy) (metformin, farxiga; insulin added last visit). She is compliant with treatment all of the time (but is stress eating and binge eating - possibly worse since stopping Vyvanse). An ACE inhibitor/angiotensin II receptor blocker is being taken.  ?Hyperlipidemia ?This is a chronic problem. The problem is uncontrolled. Pertinent negatives include no chest pain or shortness of breath. Current antihyperlipidemic treatment includes statins (statin dose increased last visit).  ? ?Lab Results  ?Component Value Date  ? NA 134 03/10/2021  ? K 4.5 03/10/2021  ? CO2 22 03/10/2021  ? GLUCOSE 200 (H) 03/10/2021  ? BUN 26 (H) 03/10/2021  ? CREATININE 0.87 03/10/2021  ? CALCIUM 9.3 03/10/2021  ? EGFR 81 03/10/2021  ? GFRNONAA >60 01/04/2021  ? ?Lab Results  ?Component Value Date  ? CHOL 203 (H) 03/10/2021  ? HDL 42 03/10/2021  ? LDLCALC 109 (H) 03/10/2021  ? LDLDIRECT 151.0 06/11/2018  ? TRIG 302 (H) 03/10/2021  ? CHOLHDL 4.8 (H) 03/10/2021  ? ?Lab Results  ?Component Value Date  ? TSH 1.900 03/10/2021  ? ?Lab Results  ?Component Value Date  ? HGBA1C 8.6 (H) 03/10/2021  ? ?Lab Results  ?Component Value Date  ? WBC 8.1 03/10/2021  ? HGB 14.2 03/10/2021  ? HCT 42.5 03/10/2021  ? MCV 88 03/10/2021  ? PLT 230 03/10/2021   ? ?Lab Results  ?Component Value Date  ? ALT 47 (H) 03/10/2021  ? AST 22 03/10/2021  ? ALKPHOS 75 03/10/2021  ? BILITOT <0.2 03/10/2021  ? ?No results found for: 25OHVITD2, [place], VD25OH  ? ?Review of Systems  ?Constitutional:  Negative for appetite change, fatigue, fever and unexpected weight change.  ?HENT:  Negative for tinnitus and trouble swallowing.   ?Eyes:  Negative for visual disturbance.  ?Respiratory:  Negative for cough, chest tightness and shortness of breath.   ?Cardiovascular:  Negative for chest pain, palpitations and leg swelling.  ?Gastrointestinal:  Negative for abdominal pain.  ?Endocrine: Negative for polydipsia and polyuria.  ?Genitourinary:  Negative for dysuria and hematuria.  ?Musculoskeletal:  Negative for arthralgias.  ?Neurological:  Negative for tremors, numbness and headaches.  ?Psychiatric/Behavioral:  Negative for dysphoric mood.   ? ?Patient Active Problem List  ? Diagnosis Date Noted  ? Carpal tunnel syndrome of right wrist 01/18/2021  ? Shoulder tendonitis, right 01/18/2021  ? OSA on CPAP 07/07/2020  ? Environmental and seasonal allergies 07/07/2020  ? Encounter for screening colonoscopy   ? Polyp of colon   ? Allergic rhinitis 08/22/2012  ? Hyperlipidemia associated with type 2 diabetes mellitus ([place])   ? Type II diabetes mellitus with complication (HCC)   ? Essential hypertension   ? Adjustment reaction with anxiety and depression   ? ADD (attention deficit disorder)   ? ? ?Allergies  ?Allergen Reactions  ? Sulfa Antibiotics Anaphylaxis  ? Doxycycline   ?  rash  ? Lisinopril   ?  cough  ? Cefzil [Cefprozil] Rash  ? ? ?Past Surgical History:  ?Procedure Laterality Date  ? COLONOSCOPY WITH PROPOFOL N/A 11/27/2019  ? Procedure: COLONOSCOPY WITH PROPOFOL;  Surgeon: Virgel Manifold, MD;  Location: [facility] ENDOSCOPY;  Service: Endoscopy;  Laterality: N/A;  ? none    ? ? ?Social History  ? ?Tobacco Use  ? Smoking status: Never  ? Smokeless tobacco: Never  ?Vaping Use  ? Vaping  Use: Never used  ?Substance Use Topics  ? Alcohol use: Not Currently  ?  Alcohol/week: 0.0 standard drinks  ?  Comment: rarely  ? Drug use: No  ? ? ? ?Medication list has been reviewed and updated. ? ?Current Meds  ?Medication Sig  ? albuterol (PROVENTIL HFA;VENTOLIN HFA) 108 (90 BASE) MCG/ACT inhaler Inhale 2 puffs into the lungs every 4 (four) hours as needed for wheezing.   ? amLODipine (NORVASC) 10 MG tablet TAKE 1 TABLET(10 MG) BY MOUTH DAILY  ? BD PEN NEEDLE NANO 2ND GEN 32G X 4 MM MISC USE DAILY AT 6AM  ? Continuous Blood Gluc Sensor (FREESTYLE LIBRE 2 SENSOR) MISC USE TO TEST BLOOD SUGAR TWICE DAILY  ? FARXIGA 10 MG TABS tablet TAKE 1 TABLET(10 MG) BY MOUTH DAILY BEFORE AND BREAKFAST  ? FLUTICASONE PROPIONATE, NASAL, NA as needed.  ? glucose blood (TRUE METRIX BLOOD GLUCOSE TEST) test strip Dx E11.9  ? insulin glargine (LANTUS) 100 UNIT/ML Solostar Pen Inject 10 Units into the skin in the morning.  ? Insulin Pen Needle (PEN NEEDLES) 32G X 5 MM MISC 1 each by Does not apply route daily at 6 (six) AM.  ? losartan (COZAAR) 100 MG tablet TAKE 1 TABLET BY MOUTH EVERY DAY  ? metFORMIN (GLUCOPHAGE) 1000 MG tablet TAKE 1 TABLET(1000 MG) BY MOUTH TWICE DAILY WITH A MEAL  ? montelukast (SINGULAIR) 10 MG tablet TAKE 1 TABLET BY MOUTH EVERY DAY  ? Multiple Vitamin (MULTIVITAMIN) tablet Take 1 tablet by mouth daily.  ? Omega-3 Fatty Acids (FISH OIL TRIPLE STRENGTH) 1400 MG CAPS Take by mouth 2 (two) times daily.  ? rosuvastatin (CRESTOR) 10 MG tablet Take 1 tablet (10 mg total) by mouth daily.  ? sertraline (ZOLOFT) 50 MG tablet Take 50 mg by mouth daily.  ? traZODone (DESYREL) 50 MG tablet Take 50-100 mg by mouth at bedtime as needed.  ? ? ? ?  07/09/2021  ?  1:33 PM 03/10/2021  ?  8:46 AM 01/18/2021  ?  1:42 PM 10/21/2020  ?  1:23 PM  ?PHQ 2/9 Scores  ?PHQ - 2 Score 2 0 0 0  ?PHQ- 9 Score '9 3 1 3  ' ? ? ? ?  07/09/2021  ?  1:34 PM 03/10/2021  ?  8:46 AM 01/18/2021  ?  1:43 PM 10/21/2020  ?  1:24 PM  ?GAD 7 : Generalized Anxiety  Score  ?Nervous, Anxious, on Edge 2 0 0 0  ?Control/stop worrying 0 0 0 1  ?Worry too much - different things 1 0 0 0  ?Trouble relaxing 1 0 0 0  ?Restless 0 0 0 0  ?Easily annoyed or irritable 2 0 1 2  ?Afraid - awful might happen 1 0 0 0  ?Total GAD 7 Score 7 0 1 3  ?Anxiety Difficulty Somewhat difficult Not difficult at all  Not difficult at all  ? ? ?BP Readings from Last 3 Encounters:  ?07/09/21 120/74  ?04/13/21 118/60  ?03/10/21 110/78  ? ? ?  Physical Exam ?Vitals and nursing note reviewed.  ?Constitutional:   ?   General: She is not in acute distress. ?   Appearance: She is well-developed.  ?HENT:  ?   Head: Normocephalic and atraumatic.  ?Neck:  ?   Vascular: No carotid bruit.  ?Cardiovascular:  ?   Rate and Rhythm: Normal rate and regular rhythm.  ?   Pulses: Normal pulses.  ?   Heart sounds: No murmur heard. ?Pulmonary:  ?   Effort: Pulmonary effort is normal. No respiratory distress.  ?   Breath sounds: No wheezing or rhonchi.  ?Musculoskeletal:  ?   Right lower leg: No edema.  ?   Left lower leg: No edema.  ?Lymphadenopathy:  ?   Cervical: No cervical adenopathy.  ?Skin: ?   General: Skin is warm and dry.  ?   Capillary Refill: Capillary refill takes less than 2 seconds.  ?   Findings: No rash.  ?Neurological:  ?   General: No focal deficit present.  ?   Mental Status: She is alert and oriented to person, place, and time.  ?Psychiatric:     ?   Mood and Affect: Mood normal.     ?   Behavior: Behavior normal.  ? ? ?Wt Readings from Last 3 Encounters:  ?07/09/21 220 lb (99.8 kg)  ?04/13/21 219 lb 8 oz (99.6 kg)  ?03/10/21 216 lb (98 kg)  ? ? ?BP 120/74   Pulse 95   Ht '5\' 3"'  (1.6 m)   Wt 220 lb (99.8 kg)   LMP 01/17/2020 (Approximate)   SpO2 97%   BMI 38.97 kg/m?  ? ?Assessment and Plan: ?1. Essential hypertension ?Clinically stable exam with well controlled BP. ?Tolerating medications without side effects at this time. ?Pt to continue current regimen and low sodium diet; benefits of regular exercise  as able discussed. ? ?2. Type II diabetes mellitus with complication (HCC) ?She has not been checking her BS because the Free Style does not stay on. ?She is taking the insulin 10 units without any low readings.

## 2021-07-11 LAB — HEMOGLOBIN A1C
Est. average glucose Bld gHb Est-mCnc: 209 mg/dL
Hgb A1c MFr Bld: 8.9 % — ABNORMAL HIGH (ref 4.8–5.6)

## 2021-07-11 LAB — COMPREHENSIVE METABOLIC PANEL
ALT: 47 IU/L — ABNORMAL HIGH (ref 0–32)
AST: 29 IU/L (ref 0–40)
Albumin/Globulin Ratio: 2 (ref 1.2–2.2)
Albumin: 4.7 g/dL (ref 3.8–4.9)
Alkaline Phosphatase: 82 IU/L (ref 44–121)
BUN/Creatinine Ratio: 22 (ref 9–23)
BUN: 20 mg/dL (ref 6–24)
Bilirubin Total: 0.3 mg/dL (ref 0.0–1.2)
CO2: 25 mmol/L (ref 20–29)
Calcium: 9.6 mg/dL (ref 8.7–10.2)
Chloride: 99 mmol/L (ref 96–106)
Creatinine, Ser: 0.91 mg/dL (ref 0.57–1.00)
Globulin, Total: 2.4 g/dL (ref 1.5–4.5)
Glucose: 143 mg/dL — ABNORMAL HIGH (ref 70–99)
Potassium: 4.5 mmol/L (ref 3.5–5.2)
Sodium: 139 mmol/L (ref 134–144)
Total Protein: 7.1 g/dL (ref 6.0–8.5)
eGFR: 76 mL/min/{1.73_m2} (ref >59)

## 2021-07-11 LAB — MICROALBUMIN / CREATININE URINE RATIO
Creatinine, Urine: 57.8 mg/dL
Microalb/Creat Ratio: <5 mg/g creat (ref 0–29)
Microalbumin, Urine: <3 ug/mL

## 2021-07-11 LAB — LIPID PANEL
Chol/HDL Ratio: 3.5 ratio (ref 0.0–4.4)
Cholesterol, Total: 180 mg/dL (ref 100–199)
HDL: 52 mg/dL (ref >39)
LDL Chol Calc (NIH): 84 mg/dL (ref 0–99)
Triglycerides: 269 mg/dL — ABNORMAL HIGH (ref 0–149)
VLDL Cholesterol Cal: 44 mg/dL — ABNORMAL HIGH (ref 5–40)

## 2021-07-21 ENCOUNTER — Other Ambulatory Visit: Payer: Self-pay | Admitting: Internal Medicine

## 2021-07-21 DIAGNOSIS — E118 Type 2 diabetes mellitus with unspecified complications: Secondary | ICD-10-CM

## 2021-07-22 ENCOUNTER — Other Ambulatory Visit: Payer: Self-pay | Admitting: Internal Medicine

## 2021-07-22 DIAGNOSIS — E118 Type 2 diabetes mellitus with unspecified complications: Secondary | ICD-10-CM

## 2021-07-22 MED ORDER — DAPAGLIFLOZIN PROPANEDIOL 10 MG PO TABS: 10.0000 mg | ORAL_TABLET | Freq: Every day | ORAL | 0 refills | Status: DC

## 2021-07-22 NOTE — Telephone Encounter (Signed)
Med was previously refilled today 07/22/21 ? ?Requested Prescriptions  ?Refused Prescriptions Disp Refills  ?? FARXIGA 10 MG TABS tablet [Pharmacy Med Name: FARXIGA 10MG TABLETS] 90 tablet 0  ?  Sig: TAKE 1 TABLET(10 MG) BY MOUTH DAILY BEFORE AND BREAKFAST  ?  ? Endocrinology:  Diabetes - SGLT2 Inhibitors Failed - 07/21/2021 11:07 AM  ?  ?  Failed - HBA1C is between 0 and 7.9 and within 180 days  ?  HbA1c POC (<> result, manual entry)  ?Date Value Ref Range Status  ?06/11/2018 9.5 4.0 - 5.6 % Final  ? ?Hgb A1c MFr Bld  ?Date Value Ref Range Status  ?07/09/2021 8.9 (H) 4.8 - 5.6 % Final  ?  Comment:  ?           Prediabetes: 5.7 - 6.4 ?         Diabetes: >6.4 ?         Glycemic control for adults with diabetes: <7.0 ?  ?   ?  ?  Passed - Cr in normal range and within 360 days  ?  Creatinine, Ser  ?Date Value Ref Range Status  ?07/09/2021 0.91 0.57 - 1.00 mg/dL Final  ? ?Creatinine,U  ?Date Value Ref Range Status  ?06/11/2018 68.1 mg/dL Final  ?   ?  ?  Passed - eGFR in normal range and within 360 days  ?  GFR calc Af Amer  ?Date Value Ref Range Status  ?03/04/2020 83 >59 mL/min/1.73 Final  ?  Comment:  ?  **In accordance with recommendations from the NKF-ASN Task force,** ?  [facility] is in the process of updating its eGFR calculation to the ?  2021 CKD-EPI creatinine equation that estimates kidney function ?  without a race variable. ?  ? ?GFR, Estimated  ?Date Value Ref Range Status  ?01/04/2021 >60 >60 mL/min Final  ?  Comment:  ?  (NOTE) ?Calculated using the CKD-EPI Creatinine Equation (2021) ?  ? ?GFR  ?Date Value Ref Range Status  ?06/11/2018 60.41 >60.00 mL/min Final  ? ?eGFR  ?Date Value Ref Range Status  ?07/09/2021 76 >59 mL/min/1.73 Final  ?   ?  ?  Passed - Valid encounter within last 6 months  ?  Recent Outpatient Visits   ?      ? 1 week ago Essential hypertension  ? [facility] Glean [facility], [facility]  ? 4 months ago Annual physical exam  ? [facility] Glean [facility], [facility]  ? 6 months ago  Carpal tunnel syndrome of right wrist  ? [facility] Glean Hess, MD  ? 9 months ago Type II diabetes mellitus with complication [facility])  ? [facility] Glean Hess, MD  ? 1 year ago Type II diabetes mellitus with complication [facility])  ? [facility] Glean Hess, MD  ?  ?  ?Future Appointments   ?        ? In 3 months Army Melia Jesse Sans, MD [facility], [place]  ? In 7 months Army Melia Jesse Sans, MD [facility], [place]  ?  ? ?  ?  ?  ? ?Medication refilled today 07/22/21 ?

## 2021-07-26 ENCOUNTER — Ambulatory Visit
Admission: EM | Admit: 2021-07-26 | Discharge: 2021-07-26 | Disposition: A | Discharge status: Home / Self Care | Payer: 59 | Attending: Urgent Care | Admitting: Urgent Care

## 2021-07-26 ENCOUNTER — Ambulatory Visit (INDEPENDENT_AMBULATORY_CARE_PROVIDER_SITE_OTHER): Payer: 59

## 2021-07-26 DIAGNOSIS — M1711 Unilateral primary osteoarthritis, right knee: Secondary | ICD-10-CM | POA: Diagnosis not present

## 2021-07-26 DIAGNOSIS — M25561 Pain in right knee: Secondary | ICD-10-CM | POA: Diagnosis not present

## 2021-07-26 DIAGNOSIS — M2391 Unspecified internal derangement of right knee: Secondary | ICD-10-CM

## 2021-07-26 MED ORDER — MELOXICAM 7.5 MG PO TABS: 7.5000 mg | ORAL_TABLET | Freq: Every day | ORAL | 0 refills | Status: AC

## 2021-07-26 NOTE — Discharge Instructions (Addendum)
You xray does not show any bony abnormalities apart from mild osteoarthritis. ?Given the location of your pain however, we cannot rule out soft tissue / ligamentous injury. ?Please start taking the meloxicam once daily. ?Since the knee immobilizer here did not fit, it would be recommended to get one at a DME supply store, or to go to [facility] tomorrow for a proper fitting device. ? ?Otherwise, Please follow up with ortho in one week if symptoms persist. ?

## 2021-07-26 NOTE — ED Triage Notes (Signed)
Patient presents to Urgent Care with complaints of right knee injury that happened today. Pt states she was walking down steps and heard a "pop" sound. Pt states unable to bend knee.  ?

## 2021-07-28 NOTE — ED Provider Notes (Signed)
?[facility] ? ? ? ?CSN: [id number] ?Arrival date & time: 07/26/21  2000 ? ? ?  ? ?History   ?Chief Complaint ?Chief Complaint  ?Patient presents with  ? Knee Pain  ? ? ?HPI ?Samantha Moses is a 51 y.o. female.  ? ?Pleasant 51yo female presents this evening due to complaints of R knee pain. Early this morning, she was stepping off a step outside and somehow twisted her knee, falling down. She reports hearing a pop, with instant pain. She works at [employer] and was on her feet, standing for 12 hours today. She reports severe pain primarily to the anterior portion of her knee, but worse on the lateral aspect. She reports swelling but no bruising. Any amount of bending causes severe pain. She is able to bear weight on it. She did not try any treatments today. ? ? ?Knee Pain ? ?Past Medical History:  ?Diagnosis Date  ? ADD (attention deficit disorder)   ? Depression   ? Diabetes mellitus without complication ([place])   ? Hyperlipidemia   ? Hypertension   ? ? ?Patient Active Problem List  ? Diagnosis Date Noted  ? Carpal tunnel syndrome of right wrist 01/18/2021  ? Shoulder tendonitis, right 01/18/2021  ? OSA on CPAP 07/07/2020  ? Environmental and seasonal allergies 07/07/2020  ? Encounter for screening colonoscopy   ? Polyp of colon   ? Allergic rhinitis 08/22/2012  ? Hyperlipidemia associated with type 2 diabetes mellitus ([place])   ? Type II diabetes mellitus with complication (HCC)   ? Essential hypertension   ? Adjustment reaction with anxiety and depression   ? ADD (attention deficit disorder)   ? ? ?Past Surgical History:  ?Procedure Laterality Date  ? COLONOSCOPY WITH PROPOFOL N/A 11/27/2019  ? Procedure: COLONOSCOPY WITH PROPOFOL;  Surgeon: Virgel Manifold, MD;  Location: [facility] ENDOSCOPY;  Service: Endoscopy;  Laterality: N/A;  ? none    ? ? ?OB History   ?No obstetric history on file. ?  ? ? ? ?Home Medications   ? ?Prior to Admission medications   ?Medication Sig Start Date End Date Taking? Authorizing  Provider  ?meloxicam (MOBIC) 7.5 MG tablet Take 1 tablet (7.5 mg total) by mouth daily. 07/26/21 08/25/21 Yes Clent Damore L, PA  ?albuterol (PROVENTIL HFA;VENTOLIN HFA) 108 (90 BASE) MCG/ACT inhaler Inhale 2 puffs into the lungs every 4 (four) hours as needed for wheezing.     [provider]  ?amLODipine (NORVASC) 10 MG tablet TAKE 1 TABLET(10 MG) BY MOUTH DAILY 05/18/21   Glean Hess, MD  ?BD PEN NEEDLE NANO 2ND GEN 32G X 4 MM MISC USE DAILY AT 6AM 06/04/21   [provider]  ?Continuous Blood Gluc Sensor (FREESTYLE LIBRE 2 SENSOR) MISC USE TO TEST BLOOD SUGAR TWICE DAILY 04/20/21   [provider]  ?dapagliflozin propanediol (FARXIGA) 10 MG TABS tablet Take 1 tablet (10 mg total) by mouth daily. 07/22/21   Glean Hess, MD  ?FLUTICASONE PROPIONATE, NASAL, NA as needed. 08/16/20   [provider]  ?glucose blood (TRUE METRIX BLOOD GLUCOSE TEST) test strip Dx E11.9 10/25/19   Glean Hess, MD  ?insulin glargine (LANTUS) 100 UNIT/ML Solostar Pen Inject 10 Units into the skin in the morning. 03/16/21   Glean Hess, MD  ?Insulin Pen Needle (PEN NEEDLES) 32G X 5 MM MISC 1 each by Does not apply route daily at 6 (six) AM. 03/16/21   Glean Hess, MD  ?losartan (COZAAR) 100 MG  tablet TAKE 1 TABLET BY MOUTH EVERY DAY 05/18/21   Glean Hess, MD  ?metFORMIN (GLUCOPHAGE) 1000 MG tablet TAKE 1 TABLET(1000 MG) BY MOUTH TWICE DAILY WITH A MEAL 05/18/21   Glean Hess, MD  ?montelukast (SINGULAIR) 10 MG tablet TAKE 1 TABLET BY MOUTH EVERY DAY 04/05/21   Glean Hess, MD  ?Multiple Vitamin (MULTIVITAMIN) tablet Take 1 tablet by mouth daily.    [provider]  ?Omega-3 Fatty Acids (FISH OIL TRIPLE STRENGTH) 1400 MG CAPS Take by mouth 2 (two) times daily.    [provider]  ?rosuvastatin (CRESTOR) 10 MG tablet Take 1 tablet (10 mg total) by mouth daily. 03/16/21   Glean Hess, MD  ?sertraline (ZOLOFT) 50 MG tablet Take 50 mg by mouth  daily.    [provider]  ?traZODone (DESYREL) 50 MG tablet Take 50-100 mg by mouth at bedtime as needed. 05/25/21   [provider]  ? ? ?Family History ?Family History  ?Problem Relation Age of Onset  ? Hyperlipidemia Mother   ? Hypertension Mother   ? Hyperlipidemia Father   ? Hypertension Father   ? Diabetes Father   ? Cancer Maternal Aunt 40  ?     breast CA  ? Breast cancer Cousin   ? Breast cancer Other   ? Breast cancer Other   ? ? ?Social History ?Social History  ? ?Tobacco Use  ? Smoking status: Never  ? Smokeless tobacco: Never  ?Vaping Use  ? Vaping Use: Never used  ?Substance Use Topics  ? Alcohol use: Not Currently  ?  Alcohol/week: 0.0 standard drinks  ?  Comment: rarely  ? Drug use: No  ? ? ? ?Allergies   ?Sulfa antibiotics, Doxycycline, Lisinopril, and Cefzil [cefprozil] ? ? ?Review of Systems ?Review of Systems ?As per HPI ? ?Physical Exam ?Triage Vital Signs ?ED Triage Vitals  ?Enc Vitals Group  ?   BP --   ?   Pulse Rate 07/26/21 2025 83  ?   Resp 07/26/21 2025 16  ?   Temp 07/26/21 2025 98.3 ?F (36.8 ?C)  ?   Temp Source 07/26/21 2025 Oral  ?   SpO2 07/26/21 2025 96 %  ?   Weight --   ?   Height --   ?   Head Circumference --   ?   Peak Flow --   ?   Pain Score 07/26/21 2024 7  ?   Pain Loc --   ?   Pain Edu? --   ?   Excl. in [place]? --   ? ?No data found. ? ?Updated Vital Signs ?Pulse 83   Temp 98.3 ?F (36.8 ?C) (Oral)   Resp 16   LMP 01/17/2020 (Approximate)   SpO2 96%  ? ?Visual Acuity ?Right Eye Distance:   ?Left Eye Distance:   ?Bilateral Distance:   ? ?Right Eye Near:   ?Left Eye Near:    ?Bilateral Near:    ? ?Physical Exam ?Vitals and nursing note reviewed. Exam conducted with a chaperone present.  ?Constitutional:   ?   General: She is not in acute distress. ?   Appearance: Normal appearance. She is obese. She is not ill-appearing, toxic-appearing or diaphoretic.  ?   Comments: Uncomfortable with any ROM of R knee, otherwise NAD  ?HENT:  ?   Head: Normocephalic and  atraumatic.  ?Musculoskeletal:  ?   Right knee: Effusion (minimal), bony tenderness and crepitus present. Decreased range of motion. Tenderness  present over the lateral joint line, MCL and LCL. No LCL laxity, MCL laxity, ACL laxity or PCL laxity.  ?   Instability Tests: Anterior drawer test negative. Posterior drawer test negative. Anterior Lachman test negative.  ?   Left knee: Normal. No swelling, effusion, bony tenderness or crepitus. Normal range of motion. No tenderness. No lateral joint line, MCL or LCL tenderness. No LCL laxity, MCL laxity, ACL laxity or PCL laxity. ?   Right lower leg: Normal. No swelling, deformity, tenderness or bony tenderness. No edema.  ?   Left lower leg: Normal. No swelling. No edema.  ?   Right ankle: Normal.  ?   Left ankle: Normal.  ?Neurological:  ?   Mental Status: She is alert.  ? ? ? ?UC Treatments / Results  ?Labs ?(all labs ordered are listed, but only abnormal results are displayed) ?Labs Reviewed - No data to display ? ?EKG ? ? ?Radiology ?DG Knee Complete 4 Views Right ? ?Result Date: 07/26/2021 ?CLINICAL DATA:  Injury pain EXAM: RIGHT KNEE - COMPLETE 4+ VIEW COMPARISON:  None. FINDINGS: No fracture or malalignment. Mild tricompartment arthritis of the knee. No sizable effusion. IMPRESSION: No acute osseous abnormality.  Mild arthritis Electronically Signed   By: Donavan Foil M.D.   On: 07/26/2021 20:54   ? ?Procedures ?Procedures (including critical care time) ? ?Medications Ordered in UC ?Medications - No data to display ? ?Initial Impression / Assessment and Plan / UC Course  ?I have reviewed the triage vital signs and the nursing notes. ? ?Pertinent labs & imaging results that were available during my care of the patient were reviewed by me and considered in my medical decision making (see chart for details). ? ?  ? ?Internal derangement of R knee - xray unremarkable for acute injury. Given description and findings, concern for possible meniscus injury. Tried to place  patient in a knee stabilizer, however she refused and stated it was too long and uncomfortable. Hinged knee brace unlikely to be helpful given her degree of pain with ROM. Will start mobic daily, referral placed t

## 2021-08-11 ENCOUNTER — Encounter: Payer: Self-pay | Admitting: Internal Medicine

## 2021-08-11 ENCOUNTER — Other Ambulatory Visit: Payer: Self-pay | Admitting: Internal Medicine

## 2021-08-11 DIAGNOSIS — E118 Type 2 diabetes mellitus with unspecified complications: Secondary | ICD-10-CM

## 2021-08-11 MED ORDER — INSULIN GLARGINE 100 UNIT/ML SOLOSTAR PEN: 20.0000 [IU] | PEN_INJECTOR | Freq: Every day | SUBCUTANEOUS | 0 refills | Status: DC

## 2021-08-16 ENCOUNTER — Other Ambulatory Visit: Payer: Self-pay | Admitting: Internal Medicine

## 2021-08-16 DIAGNOSIS — E118 Type 2 diabetes mellitus with unspecified complications: Secondary | ICD-10-CM

## 2021-08-21 ENCOUNTER — Encounter: Payer: Self-pay | Admitting: Internal Medicine

## 2021-08-25 ENCOUNTER — Ambulatory Visit: Payer: 59 | Admitting: Internal Medicine

## 2021-09-05 ENCOUNTER — Other Ambulatory Visit: Payer: Self-pay | Admitting: Internal Medicine

## 2021-09-05 DIAGNOSIS — E1169 Type 2 diabetes mellitus with other specified complication: Secondary | ICD-10-CM

## 2021-09-07 NOTE — Telephone Encounter (Signed)
Requested Prescriptions  Pending Prescriptions Disp Refills  . rosuvastatin (CRESTOR) 10 MG tablet [Pharmacy Med Name: ROSUVASTATIN '10MG'$  TABLETS] 90 tablet 2    Sig: TAKE 1 TABLET(10 MG) BY MOUTH DAILY     Cardiovascular:  Antilipid - Statins 2 Failed - 09/05/2021  7:33 AM      Failed - Lipid Panel in normal range within the last 12 months    Cholesterol, Total  Date Value Ref Range Status  07/09/2021 180 100 - 199 mg/dL Final   LDL Chol Calc (NIH)  Date Value Ref Range Status  07/09/2021 84 0 - 99 mg/dL Final   Direct LDL  Date Value Ref Range Status  06/11/2018 151.0 mg/dL Final    Comment:    Optimal:  <100 mg/dLNear or Above Optimal:  100-129 mg/dLBorderline High:  130-159 mg/dLHigh:  160-189 mg/dLVery High:  >190 mg/dL   HDL  Date Value Ref Range Status  07/09/2021 52 >39 mg/dL Final   Triglycerides  Date Value Ref Range Status  07/09/2021 269 (H) 0 - 149 mg/dL Final         Passed - Cr in normal range and within 360 days    Creatinine, Ser  Date Value Ref Range Status  07/09/2021 0.91 0.57 - 1.00 mg/dL Final   Creatinine,U  Date Value Ref Range Status  06/11/2018 68.1 mg/dL Final         Passed - Patient is not pregnant      Passed - Valid encounter within last 12 months    Recent Outpatient Visits          2 months ago Essential hypertension   [place], Laura H, MD   6 months ago Annual physical exam   [facility] Glean Hess, MD   7 months ago Carpal tunnel syndrome of right wrist   [facility] Glean Hess, MD   10 months ago Type II diabetes mellitus with complication [facility])   [facility] Glean Hess, MD   1 year ago Type II diabetes mellitus with complication [facility])   [facility] Glean Hess, MD      Future Appointments            In 2 months Army Melia Jesse Sans, MD [facility], [place]   In 6 months Army Melia, Jesse Sans, [facility], [facility]

## 2021-10-01 ENCOUNTER — Other Ambulatory Visit: Payer: Self-pay | Admitting: Internal Medicine

## 2021-10-01 NOTE — Telephone Encounter (Signed)
Requested Prescriptions  Pending Prescriptions Disp Refills  . montelukast (SINGULAIR) 10 MG tablet [Pharmacy Med Name: MONTELUKAST '10MG'$  TABLETS] 90 tablet 1    Sig: TAKE 1 TABLET BY MOUTH EVERY DAY     Pulmonology:  Leukotriene Inhibitors Passed - 10/01/2021  9:58 AM      Passed - Valid encounter within last 12 months    Recent Outpatient Visits          2 months ago Essential hypertension   [facility] Glean Hess, MD   6 months ago Annual physical exam   [facility] Glean Hess, MD   8 months ago Carpal tunnel syndrome of right wrist   [facility] Glean Hess, MD   11 months ago Type II diabetes mellitus with complication [facility])   [facility] Glean Hess, MD   1 year ago Type II diabetes mellitus with complication [facility])   [facility] Glean Hess, MD      Future Appointments            In 1 month Army Melia Jesse Sans, MD [facility], [place]   In 5 months Army Melia Jesse Sans, MD [facility], [facility]

## 2021-10-21 ENCOUNTER — Other Ambulatory Visit: Payer: Self-pay | Admitting: Internal Medicine

## 2021-10-21 DIAGNOSIS — E118 Type 2 diabetes mellitus with unspecified complications: Secondary | ICD-10-CM

## 2021-10-21 NOTE — Telephone Encounter (Signed)
Requested Prescriptions  Pending Prescriptions Disp Refills  . FARXIGA 10 MG TABS tablet [Pharmacy Med Name: FARXIGA 10MG TABLETS] 90 tablet 0    Sig: TAKE 1 TABLET(10 MG) BY MOUTH DAILY     Endocrinology:  Diabetes - SGLT2 Inhibitors Failed - 10/21/2021 10:20 AM      Failed - HBA1C is between 0 and 7.9 and within 180 days    HbA1c POC (<> result, manual entry)  Date Value Ref Range Status  06/11/2018 9.5 4.0 - 5.6 % Final   Hgb A1c MFr Bld  Date Value Ref Range Status  07/09/2021 8.9 (H) 4.8 - 5.6 % Final    Comment:             Prediabetes: 5.7 - 6.4          Diabetes: >6.4          Glycemic control for adults with diabetes: <7.0          Passed - Cr in normal range and within 360 days    Creatinine, Ser  Date Value Ref Range Status  07/09/2021 0.91 0.57 - 1.00 mg/dL Final   Creatinine,U  Date Value Ref Range Status  06/11/2018 68.1 mg/dL Final         Passed - eGFR in normal range and within 360 days    GFR calc Af Amer  Date Value Ref Range Status  03/04/2020 83 >59 mL/min/1.73 Final    Comment:    **In accordance with recommendations from the NKF-ASN Task force,**   [facility] is in the process of updating its eGFR calculation to the   2021 CKD-EPI creatinine equation that estimates kidney function   without a race variable.    GFR, Estimated  Date Value Ref Range Status  01/04/2021 >60 >60 mL/min Final    Comment:    (NOTE) Calculated using the CKD-EPI Creatinine Equation (2021)    GFR  Date Value Ref Range Status  06/11/2018 60.41 >60.00 mL/min Final   eGFR  Date Value Ref Range Status  07/09/2021 76 >59 mL/min/1.73 Final         Passed - Valid encounter within last 6 months    Recent Outpatient Visits          3 months ago Essential hypertension   [facility] Glean Hess, MD   7 months ago Annual physical exam   [facility] Gle[facility], [facility]   9 months ago Carpal tunnel syndrome of right wrist   [facility]  Glean Hess, MD   1 year ago Type II diabetes mellitus with complication [facility])   [facility] Glean Hess, MD   1 year ago Type II diabetes mellitus with complication [facility])   [facility] Glean Hess, MD      Future Appointments            In 2 weeks Army Melia Jesse Sans, MD [facility], [place]   In 4 months Army Melia Jesse Sans, MD [facility], [facility]

## 2021-10-27 ENCOUNTER — Other Ambulatory Visit: Payer: Self-pay | Admitting: Internal Medicine

## 2021-10-27 DIAGNOSIS — E118 Type 2 diabetes mellitus with unspecified complications: Secondary | ICD-10-CM

## 2021-10-28 NOTE — Telephone Encounter (Signed)
Requested Prescriptions  Pending Prescriptions Disp Refills  . LANTUS SOLOSTAR 100 UNIT/ML Solostar Pen [Pharmacy Med Name: LANTUS SOLOSTAR PEN INJ 3ML] 15 mL 0    Sig: INJECT 20 UNITS UNDER THE SKIN DAILY     Endocrinology:  Diabetes - Insulins Failed - 10/27/2021  7:07 PM      Failed - HBA1C is between 0 and 7.9 and within 180 days    HbA1c POC (<> result, manual entry)  Date Value Ref Range Status  06/11/2018 9.5 4.0 - 5.6 % Final   Hgb A1c MFr Bld  Date Value Ref Range Status  07/09/2021 8.9 (H) 4.8 - 5.6 % Final    Comment:             Prediabetes: 5.7 - 6.4          Diabetes: >6.4          Glycemic control for adults with diabetes: <7.0          Passed - Valid encounter within last 6 months    Recent Outpatient Visits          3 months ago Essential hypertension   [facility] Glean Hess, MD   7 months ago Annual physical exam   [facility] Glean Hess, MD   9 months ago Carpal tunnel syndrome of right wrist   [facility] Glean Hess, MD   1 year ago Type II diabetes mellitus with complication [facility])   [facility] Glean Hess, MD   1 year ago Type II diabetes mellitus with complication [facility])   [facility] Glean Hess, MD      Future Appointments            In 1 week Army Melia Jesse Sans, MD [facility], [place]   In 4 months Army Melia, Jesse Sans, MD [facility], [facility]

## 2021-11-10 ENCOUNTER — Encounter: Payer: Self-pay | Admitting: Internal Medicine

## 2021-11-10 ENCOUNTER — Ambulatory Visit (INDEPENDENT_AMBULATORY_CARE_PROVIDER_SITE_OTHER): Payer: 59 | Admitting: Internal Medicine

## 2021-11-10 VITALS — BP 116/70 | HR 83 | Ht 63.0 in | Wt 197.0 lb

## 2021-11-10 DIAGNOSIS — I10 Essential (primary) hypertension: Secondary | ICD-10-CM

## 2021-11-10 DIAGNOSIS — E118 Type 2 diabetes mellitus with unspecified complications: Secondary | ICD-10-CM | POA: Diagnosis not present

## 2021-11-10 LAB — POCT GLYCOSYLATED HEMOGLOBIN (HGB A1C): Hemoglobin A1C: 5.8 % — AB (ref 4.0–5.6)

## 2021-11-10 NOTE — Progress Notes (Signed)
Date:  11/10/2021   Name:  Samantha Moses   DOB:  Feb 19, 1971   MRN:  [id number]   Chief Complaint: Diabetes and Hypertension Eye exam due. 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  She uses herbal drops and is following a low carb diet.  Diabetes She presents for her follow-up diabetic visit. She has type 2 diabetes mellitus. Her disease course has been improving. Pertinent negatives for hypoglycemia include no headaches or tremors. Pertinent negatives for diabetes include no chest pain, no fatigue, no polydipsia and no polyuria. Current diabetic treatments: metformin, Farxiga, Lantus - lantus dose increased last visit. She is compliant with treatment all of the time.  Hypertension This is a chronic problem. The problem is controlled. Pertinent negatives include no chest pain, headaches, palpitations or shortness of breath.    Lab Results  Component Value Date   NA 139 07/09/2021   K 4.5 07/09/2021   CO2 25 07/09/2021   GLUCOSE 143 (H) 07/09/2021   BUN 20 07/09/2021   CREATININE 0.91 07/09/2021   CALCIUM 9.6 07/09/2021   EGFR 76 07/09/2021   GFRNONAA >60 01/04/2021   Lab Results  Component Value Date   CHOL 180 07/09/2021   HDL 52 07/09/2021   LDLCALC 84 07/09/2021   LDLDIRECT 151.0 06/11/2018   TRIG 269 (H) 07/09/2021   CHOLHDL 3.5 07/09/2021   Lab Results  Component Value Date   TSH 1.900 03/10/2021   Lab Results  Component Value Date   HGBA1C 8.9 (H) 07/09/2021   Lab Results  Component Value Date   WBC 8.1 03/10/2021   HGB 14.2 03/10/2021   HCT 42.5 03/10/2021   MCV 88 03/10/2021   PLT 230 03/10/2021   Lab Results  Component Value Date   ALT 47 (H) 07/09/2021   AST 29 07/09/2021   ALKPHOS 82 07/09/2021   BILITOT 0.3 07/09/2021   No results found for: "25OHVITD2", "25OHVITD3", "VD25OH"   Review of Systems  Constitutional:  Negative for appetite change, fatigue, fever and unexpected weight change.  HENT:   Negative for tinnitus and trouble swallowing.   Eyes:  Negative for visual disturbance.  Respiratory:  Negative for cough, chest tightness and shortness of breath.   Cardiovascular:  Negative for chest pain, palpitations and leg swelling.  Gastrointestinal:  Negative for abdominal pain.  Endocrine: Negative for polydipsia and polyuria.  Genitourinary:  Negative for dysuria and hematuria.  Musculoskeletal:  Negative for arthralgias.  Neurological:  Negative for tremors, numbness and headaches.  Psychiatric/Behavioral:  Negative for dysphoric mood.     Patient Active Problem List   Diagnosis Date Noted   Carpal tunnel syndrome of right wrist 01/18/2021   Shoulder tendonitis, right 01/18/2021   OSA on CPAP 07/07/2020   Environmental and seasonal allergies 07/07/2020   Encounter for screening colonoscopy    Polyp of colon    Allergic rhinitis 08/22/2012   Hyperlipidemia associated with type 2 diabetes mellitus ([place])    Type II diabetes mellitus with complication ([place])    Essential hypertension    Adjustment reaction with anxiety and depression    ADD (attention deficit disorder)     Allergies  Allergen Reactions   Sulfa Antibiotics Anaphylaxis   Doxycycline     rash   Lisinopril     cough   Cefzil [Cefprozil] Rash    Past Surgical History:  Procedure Laterality Date   COLONOSCOPY WITH PROPOFOL N/A 11/27/2019   Procedure: COLONOSCOPY WITH  PROPOFOL;  Surgeon: Virgel Manifold, MD;  Location: [facility] ENDOSCOPY;  Service: Endoscopy;  Laterality: N/A;   none      Social History   Tobacco Use   Smoking status: Never   Smokeless tobacco: Never  Vaping Use   Vaping Use: Never used  Substance Use Topics   Alcohol use: Not Currently    Alcohol/week: 0.0 standard drinks of alcohol    Comment: rarely   Drug use: No     Medication list has been reviewed and updated.  Current Meds  Medication Sig   albuterol (PROVENTIL HFA;VENTOLIN HFA) 108 (90 BASE) MCG/ACT inhaler  Inhale 2 puffs into the lungs every 4 (four) hours as needed for wheezing.    amLODipine (NORVASC) 10 MG tablet TAKE 1 TABLET(10 MG) BY MOUTH DAILY   BD PEN NEEDLE NANO 2ND GEN 32G X 4 MM MISC USE DAILY AT 6AM   FARXIGA 10 MG TABS tablet TAKE 1 TABLET(10 MG) BY MOUTH DAILY   FLUTICASONE PROPIONATE, NASAL, NA as needed.   glucose blood (TRUE METRIX BLOOD GLUCOSE TEST) test strip Dx E11.9   LANTUS SOLOSTAR 100 UNIT/ML Solostar Pen INJECT 20 UNITS UNDER THE SKIN DAILY   losartan (COZAAR) 100 MG tablet TAKE 1 TABLET BY MOUTH EVERY DAY   metFORMIN (GLUCOPHAGE) 1000 MG tablet TAKE 1 TABLET(1000 MG) BY MOUTH TWICE DAILY WITH A MEAL   methylphenidate 18 MG PO CR tablet Take 18 mg by mouth daily.   montelukast (SINGULAIR) 10 MG tablet TAKE 1 TABLET BY MOUTH EVERY DAY   Multiple Vitamin (MULTIVITAMIN) tablet Take 1 tablet by mouth daily.   Omega-3 Fatty Acids (FISH OIL TRIPLE STRENGTH) 1400 MG CAPS Take by mouth 2 (two) times daily.   rosuvastatin (CRESTOR) 10 MG tablet TAKE 1 TABLET(10 MG) BY MOUTH DAILY   sertraline (ZOLOFT) 100 MG tablet Take 100 mg by mouth daily.   traZODone (DESYREL) 100 MG tablet        11/10/2021   11:11 AM 07/09/2021    1:34 PM 03/10/2021    8:46 AM 01/18/2021    1:43 PM  GAD 7 : Generalized Anxiety Score  Nervous, Anxious, on Edge 1 2 0 0  Control/stop worrying 0 0 0 0  Worry too much - different things 1 1 0 0  Trouble relaxing 0 1 0 0  Restless 1 0 0 0  Easily annoyed or irritable 1 2 0 1  Afraid - awful might happen 0 1 0 0  Total GAD 7 Score 4 7 0 1  Anxiety Difficulty Not difficult at all Somewhat difficult Not difficult at all        11/10/2021   11:11 AM 07/09/2021    1:33 PM 03/10/2021    8:46 AM  Depression screen PHQ 2/9  Decreased Interest 0 1 0  Down, Depressed, Hopeless 0 1 0  PHQ - 2 Score 0 2 0  Altered sleeping '1 1 1  ' Tired, decreased energy 0 2 1  Change in appetite '1 3 1  ' Feeling bad or failure about yourself  0 0 0  Trouble concentrating  1 1 0  Moving slowly or fidgety/restless 1 0 0  Suicidal thoughts 0 0 0  PHQ-9 Score '4 9 3  ' Difficult doing work/chores Somewhat difficult Not difficult at all Not difficult at all    BP Readings from Last 3 Encounters:  11/10/21 116/70  07/09/21 120/74  04/13/21 118/60    Physical Exam Vitals and nursing note reviewed.  Constitutional:  General: She is not in acute distress.    Appearance: She is well-developed.  HENT:     Head: Normocephalic and atraumatic.  Cardiovascular:     Rate and Rhythm: Normal rate and regular rhythm.     Pulses: Normal pulses.     Heart sounds: No murmur heard. Pulmonary:     Effort: Pulmonary effort is normal. No respiratory distress.     Breath sounds: No wheezing or rhonchi.  Musculoskeletal:     Cervical back: Normal range of motion.     Right lower leg: No edema.     Left lower leg: No edema.  Lymphadenopathy:     Cervical: No cervical adenopathy.  Skin:    General: Skin is warm and dry.     Capillary Refill: Capillary refill takes less than 2 seconds.     Findings: No rash.  Neurological:     General: No focal deficit present.     Mental Status: She is alert and oriented to person, place, and time.  Psychiatric:        Mood and Affect: Mood normal.        Behavior: Behavior normal.     Wt Readings from Last 3 Encounters:  11/10/21 197 lb (89.4 kg)  07/09/21 220 lb (99.8 kg)  04/13/21 219 lb 8 oz (99.6 kg)    BP 116/70   Pulse 83   Ht '5\' 3"'  (1.6 m)   Wt 197 lb (89.4 kg)   LMP 01/17/2020 (Approximate)   SpO2 96%   BMI 34.90 kg/m   Assessment and Plan: 1. Essential hypertension Clinically stable exam with well controlled BP. Tolerating medications without side effects at this time. Pt to continue current regimen and low sodium diet; benefits of regular exercise as able discussed.  2. Type II diabetes mellitus with complication (HCC) Clinically stable by exam and report without s/s of hypoglycemia and excellent  improvement in BS. DM complicated by hypertension and dyslipidemia. Tolerating medications well without side effects or other concerns. Recommend titration down on insulin as needed rather than metformin. Continue weight loss efforts - POCT glycosylated hemoglobin (Hb A1C) = 5.8 down from > 8   Partially dictated using Editor, commissioning. Any errors are unintentional.  Halina Maidens, MD [facility]  11/10/2021

## 2021-11-14 ENCOUNTER — Other Ambulatory Visit: Payer: Self-pay | Admitting: Internal Medicine

## 2021-11-14 DIAGNOSIS — E118 Type 2 diabetes mellitus with unspecified complications: Secondary | ICD-10-CM

## 2021-11-14 DIAGNOSIS — I10 Essential (primary) hypertension: Secondary | ICD-10-CM

## 2021-11-16 NOTE — Telephone Encounter (Signed)
Requested Prescriptions  Pending Prescriptions Disp Refills  . amLODipine (NORVASC) 10 MG tablet [Pharmacy Med Name: AMLODIPINE BESYLATE 10MG TABLETS] 90 tablet 1    Sig: TAKE 1 TABLET(10 MG) BY MOUTH DAILY     Cardiovascular: Calcium Channel Blockers 2 Passed - 11/14/2021  6:34 AM      Passed - Last BP in normal range    BP Readings from Last 1 Encounters:  11/10/21 116/70         Passed - Last Heart Rate in normal range    Pulse Readings from Last 1 Encounters:  11/10/21 83         Passed - Valid encounter within last 6 months    Recent Outpatient Visits          6 days ago Type II diabetes mellitus with complication [facility])   [facility] Glean Hess, MD   4 months ago Essential hypertension   [facility] Glean Hess, MD   8 months ago Annual physical exam   [facility] Glean Hess, MD   10 months ago Carpal tunnel syndrome of right wrist   [facility] Glean Hess, MD   1 year ago Type II diabetes mellitus with complication [facility])   [facility] Glean Hess, MD      Future Appointments            In 3 months Army Melia Jesse Sans, MD [facility]           . losartan (COZAAR) 100 MG tablet [Pharmacy Med Name: LOSARTAN 100MG TABLETS] 90 tablet 1    Sig: TAKE 1 TABLET BY MOUTH EVERY DAY     Cardiovascular:  Angiotensin Receptor Blockers Passed - 11/14/2021  6:34 AM      Passed - Cr in normal range and within 180 days    Creatinine, Ser  Date Value Ref Range Status  07/09/2021 0.91 0.57 - 1.00 mg/dL Final   Creatinine,U  Date Value Ref Range Status  06/11/2018 68.1 mg/dL Final         Passed - K in normal range and within 180 days    Potassium  Date Value Ref Range Status  07/09/2021 4.5 3.5 - 5.2 mmol/L Final         Passed - Patient is not pregnant      Passed - Last BP in normal range    BP Readings from Last 1 Encounters:  11/10/21 116/70         Passed - Valid  encounter within last 6 months    Recent Outpatient Visits          6 days ago Type II diabetes mellitus with complication [facility])   [facility] Glean [facility], MD   4 months ago Essential hypertension   [facility] Gle[facility], [facility]   8 months ago Annual physical exam   [facility] Glean Hess, [facility]   10 months ago Carpal tunnel syndrome of right wrist   [facility] Glean [facility], [facility]   1 year ago Type II diabetes mellitus with complication [facility])   [place], Laura H, MD      Future Appointments            In 3 months Army Melia Jesse Sans, MD [facility]           . metFORMIN (GLUCOPHAGE) 1000 MG tablet [facility] Med  Name: METFORMIN 1000MG TABLETS] 180 tablet 1    Sig: TAKE 1 TABLET(1000 MG) BY MOUTH TWICE DAILY WITH A MEAL     Endocrinology:  Diabetes - Biguanides Failed - 11/14/2021  6:34 AM      Failed - B12 Level in normal range and within 720 days    No results found for: "VITAMINB12"       Passed - Cr in normal range and within 360 days    Creatinine, Ser  Date Value Ref Range Status  07/09/2021 0.91 0.57 - 1.00 mg/dL Final   Creatinine,U  Date Value Ref Range Status  06/11/2018 68.1 mg/dL Final         Passed - HBA1C is between 0 and 7.9 and within 180 days    Hemoglobin A1C  Date Value Ref Range Status  11/10/2021 5.8 (A) 4.0 - 5.6 % Final   HbA1c POC (<> result, manual entry)  Date Value Ref Range Status  06/11/2018 9.5 4.0 - 5.6 % Final   Hgb A1c MFr Bld  Date Value Ref Range Status  07/09/2021 8.9 (H) 4.8 - 5.6 % Final    Comment:             Prediabetes: 5.7 - 6.4          Diabetes: >6.4          Glycemic control for adults with diabetes: <7.0          Passed - eGFR in normal range and within 360 days    GFR calc Af Amer  Date Value Ref Range Status  03/04/2020 83 >59 mL/min/1.73 Final    Comment:    **In accordance with recommendations from the NKF-ASN  Task force,**   [facility] is in the process of updating its eGFR calculation to the   2021 CKD-EPI creatinine equation that estimates kidney function   without a race variable.    GFR, Estimated  Date Value Ref Range Status  01/04/2021 >60 >60 mL/min Final    Comment:    (NOTE) Calculated using the CKD-EPI Creatinine Equation (2021)    GFR  Date Value Ref Range Status  06/11/2018 60.41 >60.00 mL/min Final   eGFR  Date Value Ref Range Status  07/09/2021 76 >59 mL/min/1.73 Final         Passed - Valid encounter within last 6 months    Recent Outpatient Visits          6 days ago Type II diabetes mellitus with complication [facility])   [facility] Glean [facility], [facility]   4 months ago Essential hypertension   [facility] Glean Hess, [facility]   8 months ago Annual physical exam   [facility] Glean Hess, MD   10 months ago Carpal tunnel syndrome of right wrist   [facility], [facility]   1 year ago Type II diabetes mellitus with complication [facility])   [facility] Glean [facility], MD      Future Appointments            In 3 months Glean Hess, MD [facility] [place] within normal limits and completed in the last 12 months    WBC  Date Value Ref Range Status  03/10/2021 8.1 3.4 - 10.8 x10E3/uL Final  01/04/2021 8.1 4.0 - 10.5 K/uL Final   RBC  Date Value Ref Range Status  03/10/2021 4.81 3.77 - 5.28 x10E6/uL Final  01/04/2021 4.74 3.87 - 5.11 MIL/uL Final   Hemoglobin  Date Value Ref Range Status  03/10/2021 14.2 11.1 - 15.9 g/dL Final   Hematocrit  Date Value Ref Range Status  03/10/2021 42.5 34.0 - 46.6 % Final   MCHC  Date Value Ref Range Status  03/10/2021 33.4 31.5 - 35.7 g/dL Final  01/04/2021 35.1 30.0 - 36.0 g/dL Final   [facility]  Date Value Ref Range Status  03/10/2021 29.5 26.6 - 33.0 pg Final  01/04/2021 31.0 26.0 - 34.0 pg Final   MCV  Date Value  Ref Range Status  03/10/2021 88 79 - 97 fL Final   No results found for: "PLTCOUNTKUC", "LABPLAT", "POCPLA" RDW  Date Value Ref Range Status  03/10/2021 12.6 11.7 - 15.4 % Final

## 2021-12-08 LAB — HM DIABETES EYE EXAM

## 2022-01-20 ENCOUNTER — Other Ambulatory Visit: Payer: Self-pay | Admitting: Internal Medicine

## 2022-01-20 DIAGNOSIS — E118 Type 2 diabetes mellitus with unspecified complications: Secondary | ICD-10-CM

## 2022-01-21 ENCOUNTER — Other Ambulatory Visit: Payer: Self-pay | Admitting: Internal Medicine

## 2022-01-21 DIAGNOSIS — E118 Type 2 diabetes mellitus with unspecified complications: Secondary | ICD-10-CM

## 2022-01-21 NOTE — Telephone Encounter (Signed)
Requested Prescriptions  Pending Prescriptions Disp Refills  . FARXIGA 10 MG TABS tablet [Pharmacy Med Name: FARXIGA 10MG TABLETS] 90 tablet 0    Sig: TAKE 1 TABLET(10 MG) BY MOUTH DAILY     Endocrinology:  Diabetes - SGLT2 Inhibitors Passed - 01/21/2022 10:42 AM      Passed - Cr in normal range and within 360 days    Creatinine, Ser  Date Value Ref Range Status  07/09/2021 0.91 0.57 - 1.00 mg/dL Final   Creatinine,U  Date Value Ref Range Status  06/11/2018 68.1 mg/dL Final         Passed - HBA1C is between 0 and 7.9 and within 180 days    Hemoglobin A1C  Date Value Ref Range Status  11/10/2021 5.8 (A) 4.0 - 5.6 % Final   HbA1c POC (<> result, manual entry)  Date Value Ref Range Status  06/11/2018 9.5 4.0 - 5.6 % Final   Hgb A1c MFr Bld  Date Value Ref Range Status  07/09/2021 8.9 (H) 4.8 - 5.6 % Final    Comment:             Prediabetes: 5.7 - 6.4          Diabetes: >6.4          Glycemic control for adults with diabetes: <7.0          Passed - eGFR in normal range and within 360 days    GFR calc Af Amer  Date Value Ref Range Status  03/04/2020 83 >59 mL/min/1.73 Final    Comment:    **In accordance with recommendations from the NKF-ASN Task force,**   [facility] is in the process of updating its eGFR calculation to the   2021 CKD-EPI creatinine equation that estimates kidney function   without a race variable.    GFR, Estimated  Date Value Ref Range Status  01/04/2021 >60 >60 mL/min Final    Comment:    (NOTE) Calculated using the CKD-EPI Creatinine Equation (2021)    GFR  Date Value Ref Range Status  06/11/2018 60.41 >60.00 mL/min Final   eGFR  Date Value Ref Range Status  07/09/2021 76 >59 mL/min/1.73 Final         Passed - Valid encounter within last 6 months    Recent Outpatient Visits          2 months ago Type II diabetes mellitus with complication [facility])   [place] [facility] at [facility], Jesse Sans, MD    6 months ago Essential hypertension   [place] [facility] at [facility], Jesse Sans, MD   10 months ago Annual physical exam   [place] [facility] at [facility], Jesse Sans, MD   1 year ago Carpal tunnel syndrome of right wrist   [place] [facility] at [facility], Jesse Sans, MD   1 year ago Type II diabetes mellitus with complication [facility])   [place] [facility] at [facility], Jesse Sans, MD      Future Appointments            In 1 month Army Melia, Jesse Sans, MD [place] [facility] at [facility], [facility]

## 2022-01-21 NOTE — Telephone Encounter (Signed)
Requested Prescriptions  Pending Prescriptions Disp Refills  . LANTUS SOLOSTAR 100 UNIT/ML Solostar Pen [Pharmacy Med Name: LANTUS SOLOSTAR PEN INJ 3ML] 15 mL 0    Sig: ADMINISTER 20 UNITS UNDER THE SKIN DAILY     Endocrinology:  Diabetes - Insulins Passed - 01/20/2022 10:39 PM      Passed - HBA1C is between 0 and 7.9 and within 180 days    Hemoglobin A1C  Date Value Ref Range Status  11/10/2021 5.8 (A) 4.0 - 5.6 % Final   HbA1c POC (<> result, manual entry)  Date Value Ref Range Status  06/11/2018 9.5 4.0 - 5.6 % Final   Hgb A1c MFr Bld  Date Value Ref Range Status  07/09/2021 8.9 (H) 4.8 - 5.6 % Final    Comment:             Prediabetes: 5.7 - 6.4          Diabetes: >6.4          Glycemic control for adults with diabetes: <7.0          Passed - Valid encounter within last 6 months    Recent Outpatient Visits          2 months ago Type II diabetes mellitus with complication [facility])   [place] [facility] at [facility], Jesse Sans, MD   6 months ago Essential hypertension   [place] [facility] at [facility], Jesse Sans, MD   10 months ago Annual physical exam   [place] [facility] at [facility], Jesse Sans, MD   1 year ago Carpal tunnel syndrome of right wrist   [place] [facility] at [facility], Jesse Sans, MD   1 year ago Type II diabetes mellitus with complication [facility])   [place] [facility] at [facility], Jesse Sans, MD      Future Appointments            In 1 month Army Melia, Jesse Sans, MD [facility] at [facility], [facility]

## 2022-03-14 ENCOUNTER — Encounter: Payer: Self-pay | Admitting: Internal Medicine

## 2022-03-14 ENCOUNTER — Encounter: Payer: 59 | Admitting: Internal Medicine

## 2022-03-14 NOTE — Progress Notes (Deleted)
Date:  03/14/2022   Name:  Samantha Moses   DOB:  1970-05-06   MRN:  [id number]   Chief Complaint: No chief complaint on file. Samantha Moses is a 51 y.o. female who presents today for her Complete Annual Exam. She feels {DESC; WELL/FAIRLY WELL/POORLY:18703}. She reports exercising ***. She reports she is sleeping {DESC; WELL/FAIRLY WELL/POORLY:18703}. Breast complaints ***.  Mammogram: 05/2021 DEXA: none Pap smear: 04/2017 - neg/neg Colonoscopy: 11/2019 repeat 3 yrs  Health Maintenance Due  Topic Date Due   OPHTHALMOLOGY EXAM  11/11/2021   INFLUENZA VACCINE  11/16/2021   COVID-19 Vaccine (5 - 2023-24 season) 12/17/2021   FOOT EXAM  03/10/2022   PAP SMEAR-Modifier  04/25/2022    Immunization History  Administered Date(s) Administered   Influenza, Quadrivalent, Recombinant, Inj, Pf 02/24/2017   Influenza,inj,Quad PF,6+ Mos 01/17/2018, 01/05/2019   Influenza-Unspecified 02/16/2015, 01/17/2018, 01/02/2020, 01/31/2021   Moderna Sars-Covid-2 Vaccination 05/02/2019, 05/30/2019, 02/09/2020, 11/03/2020   Pneumococcal Polysaccharide-23 06/13/2014   Tdap 11/01/2012   Zoster Recombinat (Shingrix) 10/02/2020, 01/31/2021    Hypertension This is a chronic problem. The problem is controlled. Pertinent negatives include no chest pain, headaches, palpitations or shortness of breath. Past treatments include angiotensin blockers and calcium channel blockers. The current treatment provides significant improvement. There are no compliance problems.  There is no history of kidney disease, CAD/MI or CVA.  Diabetes She presents for her follow-up diabetic visit. She has type 2 diabetes mellitus. Her disease course has been stable. Pertinent negatives for hypoglycemia include no dizziness, headaches, nervousness/anxiousness or tremors. Pertinent negatives for diabetes include no chest pain, no fatigue, no polydipsia and no polyuria. Pertinent negatives for diabetic complications include no CVA. Current  diabetic treatments: Farxiga, Lantus, Metformin. An ACE inhibitor/angiotensin II receptor blocker is being taken. Eye exam is not current.  Hyperlipidemia This is a chronic problem. The problem is uncontrolled. Pertinent negatives include no chest pain or shortness of breath. Current antihyperlipidemic treatment includes statins. The current treatment provides moderate improvement of lipids.    Lab Results  Component Value Date   NA 139 07/09/2021   K 4.5 07/09/2021   CO2 25 07/09/2021   GLUCOSE 143 (H) 07/09/2021   BUN 20 07/09/2021   CREATININE 0.91 07/09/2021   CALCIUM 9.6 07/09/2021   EGFR 76 07/09/2021   GFRNONAA >60 01/04/2021   Lab Results  Component Value Date   CHOL 180 07/09/2021   HDL 52 07/09/2021   LDLCALC 84 07/09/2021   LDLDIRECT 151.0 06/11/2018   TRIG 269 (H) 07/09/2021   CHOLHDL 3.5 07/09/2021   Lab Results  Component Value Date   TSH 1.900 03/10/2021   Lab Results  Component Value Date   HGBA1C 5.8 (A) 11/10/2021   Lab Results  Component Value Date   WBC 8.1 03/10/2021   HGB 14.2 03/10/2021   HCT 42.5 03/10/2021   MCV 88 03/10/2021   PLT 230 03/10/2021   Lab Results  Component Value Date   ALT 47 (H) 07/09/2021   AST 29 07/09/2021   ALKPHOS 82 07/09/2021   BILITOT 0.3 07/09/2021   No results found for: "25OHVITD2", "25OHVITD3", "VD25OH"   Review of Systems  Constitutional:  Negative for chills, fatigue and fever.  HENT:  Negative for congestion, hearing loss, tinnitus, trouble swallowing and voice change.   Eyes:  Negative for visual disturbance.  Respiratory:  Negative for cough, chest tightness, shortness of breath and wheezing.   Cardiovascular:  Negative for chest pain, palpitations and leg swelling.  Gastrointestinal:  Negative for abdominal pain, constipation, diarrhea and vomiting.  Endocrine: Negative for polydipsia and polyuria.  Genitourinary:  Negative for dysuria, frequency, genital sores, vaginal bleeding and vaginal  discharge.  Musculoskeletal:  Negative for arthralgias, gait problem and joint swelling.  Skin:  Negative for color change and rash.  Neurological:  Negative for dizziness, tremors, light-headedness and headaches.  Hematological:  Negative for adenopathy. Does not bruise/bleed easily.  Psychiatric/Behavioral:  Negative for dysphoric mood and sleep disturbance. The patient is not nervous/anxious.     Patient Active Problem List   Diagnosis Date Noted   Carpal tunnel syndrome of right wrist 01/18/2021   Shoulder tendonitis, right 01/18/2021   OSA on CPAP 07/07/2020   Environmental and seasonal allergies 07/07/2020   Encounter for screening colonoscopy    Polyp of colon    Allergic rhinitis 08/22/2012   Hyperlipidemia associated with type 2 diabetes mellitus ([place])    Type II diabetes mellitus with complication ([place])    Essential hypertension    Adjustment reaction with anxiety and depression    ADD (attention deficit disorder)     Allergies  Allergen Reactions   Sulfa Antibiotics Anaphylaxis   Doxycycline     rash   Lisinopril     cough   Cefzil [Cefprozil] Rash    Past Surgical History:  Procedure Laterality Date   COLONOSCOPY WITH PROPOFOL N/A 11/27/2019   Procedure: COLONOSCOPY WITH PROPOFOL;  Surgeon: Virgel Manifold, MD;  Location: [facility] ENDOSCOPY;  Service: Endoscopy;  Laterality: N/A;   none      Social History   Tobacco Use   Smoking status: Never   Smokeless tobacco: Never  Vaping Use   Vaping Use: Never used  Substance Use Topics   Alcohol use: Not Currently    Alcohol/week: 0.0 standard drinks of alcohol    Comment: rarely   Drug use: No     Medication list has been reviewed and updated.  No outpatient medications have been marked as taking for the 03/14/22 encounter (Appointment) with Glean Hess, MD.       11/10/2021   11:11 AM 07/09/2021    1:34 PM 03/10/2021    8:46 AM 01/18/2021    1:43 PM  GAD 7 : Generalized Anxiety Score   Nervous, Anxious, on Edge 1 2 0 0  Control/stop worrying 0 0 0 0  Worry too much - different things 1 1 0 0  Trouble relaxing 0 1 0 0  Restless 1 0 0 0  Easily annoyed or irritable 1 2 0 1  Afraid - awful might happen 0 1 0 0  Total GAD 7 Score 4 7 0 1  Anxiety Difficulty Not difficult at all Somewhat difficult Not difficult at all        11/10/2021   11:11 AM 07/09/2021    1:33 PM 03/10/2021    8:46 AM  Depression screen PHQ 2/9  Decreased Interest 0 1 0  Down, Depressed, Hopeless 0 1 0  PHQ - 2 Score 0 2 0  Altered sleeping _0 Tired, decreased energy 0 2 1  Change in appetite _1 Feeling bad or failure about yourself  0 0 0  Trouble concentrating 1 1 0  Moving slowly or fidgety/restless 1 0 0  Suicidal thoughts 0 0 0  PHQ-9 Score _2 Difficult doing work/chores Somewhat difficult Not difficult at all Not difficult at all    BP Readings from Last 3 Encounters:  11/10/21  116/70  07/09/21 120/74  04/13/21 118/60    Physical Exam Vitals and nursing note reviewed.  Constitutional:      General: She is not in acute distress.    Appearance: She is well-developed.  HENT:     Head: Normocephalic and atraumatic.     Right Ear: Tympanic membrane and ear canal normal.     Left Ear: Tympanic membrane and ear canal normal.     Nose:     Right Sinus: No maxillary sinus tenderness.     Left Sinus: No maxillary sinus tenderness.  Eyes:     General: No scleral icterus.       Right eye: No discharge.        Left eye: No discharge.     Conjunctiva/sclera: Conjunctivae normal.  Neck:     Thyroid: No thyromegaly.     Vascular: No carotid bruit.  Cardiovascular:     Rate and Rhythm: Normal rate and regular rhythm.     Pulses: Normal pulses.     Heart sounds: Normal heart sounds.  Pulmonary:     Effort: Pulmonary effort is normal. No respiratory distress.     Breath sounds: No wheezing.  Chest:  Breasts:    Right: No mass, nipple discharge, skin change or  tenderness.     Left: No mass, nipple discharge, skin change or tenderness.  Abdominal:     General: Bowel sounds are normal.     Palpations: Abdomen is soft.     Tenderness: There is no abdominal tenderness.  Musculoskeletal:     Cervical back: Normal range of motion. No erythema.     Right lower leg: No edema.     Left lower leg: No edema.  Lymphadenopathy:     Cervical: No cervical adenopathy.  Skin:    General: Skin is warm and dry.     Findings: No rash.  Neurological:     Mental Status: She is alert and oriented to person, place, and time.     Cranial Nerves: No cranial nerve deficit.     Sensory: No sensory deficit.     Deep Tendon Reflexes: Reflexes are normal and symmetric.  Psychiatric:        Attention and Perception: Attention normal.        Mood and Affect: Mood normal.     Wt Readings from Last 3 Encounters:  11/10/21 197 lb (89.4 kg)  07/09/21 220 lb (99.8 kg)  04/13/21 219 lb 8 oz (99.6 kg)    LMP 01/17/2020 (Approximate)   Assessment and Plan:

## 2022-03-15 ENCOUNTER — Ambulatory Visit (INDEPENDENT_AMBULATORY_CARE_PROVIDER_SITE_OTHER): Payer: 59

## 2022-03-15 ENCOUNTER — Ambulatory Visit
Admission: EM | Admit: 2022-03-15 | Discharge: 2022-03-15 | Disposition: A | Discharge status: Home / Self Care | Payer: 59 | Attending: Emergency Medicine | Admitting: Emergency Medicine

## 2022-03-15 DIAGNOSIS — R058 Other specified cough: Secondary | ICD-10-CM

## 2022-03-15 DIAGNOSIS — J209 Acute bronchitis, unspecified: Secondary | ICD-10-CM | POA: Diagnosis not present

## 2022-03-15 MED ORDER — PREDNISONE 10 MG PO TABS: 40.0000 mg | ORAL_TABLET | Freq: Every day | ORAL | 0 refills | Status: AC

## 2022-03-15 MED ORDER — AZITHROMYCIN 250 MG PO TABS: 250.0000 mg | ORAL_TABLET | Freq: Every day | ORAL | 0 refills | Status: DC

## 2022-03-15 NOTE — ED Triage Notes (Signed)
Pt states that she has bilateral ear pain, sore throat and productive cough of yellowish sputum.

## 2022-03-15 NOTE — ED Provider Notes (Signed)
Roderic Palau    CSN: [id number] Arrival date & time: 03/15/22  1210      History   Chief Complaint Chief Complaint  Patient presents with   Otalgia   Cough   Sore Throat    HPI LOYE REININGER is a 51 y.o. female.  Patient presents with ear pain, sore throat, cough productive of yellow sputum, wheezing x 5 days.  She denies fever, rash, shortness of breath, vomiting, diarrhea, or other symptoms.  She has been using her albuterol inhaler.  Her medical history includes hypertension, diabetes, seasonal allergies.  The history is provided by the patient and medical records.    Past Medical History:  Diagnosis Date   ADD (attention deficit disorder)    Depression    Diabetes mellitus without complication ([place])    Hyperlipidemia    Hypertension     Patient Active Problem List   Diagnosis Date Noted   Carpal tunnel syndrome of right wrist 01/18/2021   Shoulder tendonitis, right 01/18/2021   OSA on CPAP 07/07/2020   Environmental and seasonal allergies 07/07/2020   Encounter for screening colonoscopy    Polyp of colon    Allergic rhinitis 08/22/2012   Hyperlipidemia associated with type 2 diabetes mellitus ([place])    Type II diabetes mellitus with complication ([place])    Essential hypertension    Adjustment reaction with anxiety and depression    ADD (attention deficit disorder)     Past Surgical History:  Procedure Laterality Date   COLONOSCOPY WITH PROPOFOL N/A 11/27/2019   Procedure: COLONOSCOPY WITH PROPOFOL;  Surgeon: Virgel Manifold, MD;  Location: [facility] ENDOSCOPY;  Service: Endoscopy;  Laterality: N/A;   none      OB History   No obstetric history on file.      Home Medications    Prior to Admission medications   Medication Sig Start Date End Date Taking? Authorizing Provider  albuterol (PROVENTIL HFA;VENTOLIN HFA) 108 (90 BASE) MCG/ACT inhaler Inhale 2 puffs into the lungs every 4 (four) hours as needed for wheezing.    Yes [provider]  amLODipine (NORVASC) 10 MG tablet TAKE 1 TABLET(10 MG) BY MOUTH DAILY 11/16/21  Yes Glean Hess, MD  azithromycin (ZITHROMAX) 250 MG tablet Take 1 tablet (250 mg total) by mouth daily. Take first 2 tablets together, then 1 every day until finished. 03/15/22  Yes Sharion Balloon, NP  FARXIGA 10 MG TABS tablet TAKE 1 TABLET(10 MG) BY MOUTH DAILY 01/21/22  Yes Glean Hess, MD  LANTUS SOLOSTAR 100 UNIT/ML Solostar Pen ADMINISTER 20 UNITS UNDER THE SKIN DAILY Patient taking differently: Inject 10 Units into the skin daily. 01/21/22  Yes Glean Hess, MD  lisdexamfetamine (VYVANSE) 30 MG capsule Take 30 mg by mouth daily. 02/07/22  Yes [provider]  losartan (COZAAR) 100 MG tablet TAKE 1 TABLET BY MOUTH EVERY DAY 11/16/21  Yes Glean Hess, MD  metFORMIN (GLUCOPHAGE) 1000 MG tablet TAKE 1 TABLET(1000 MG) BY MOUTH TWICE DAILY WITH A MEAL 11/16/21  Yes Glean Hess, MD  montelukast (SINGULAIR) 10 MG tablet TAKE 1 TABLET BY MOUTH EVERY DAY 10/01/21  Yes Glean Hess, MD  Multiple Vitamin (MULTIVITAMIN) tablet Take 1 tablet by mouth daily.   Yes [provider]  Omega-3 Fatty Acids (FISH OIL TRIPLE STRENGTH) 1400 MG CAPS Take by mouth 2 (two) times daily.   Yes [provider]  predniSONE (DELTASONE) 10 MG tablet Take 4 tablets (40 mg total) by  mouth daily for 5 days. 03/15/22 03/20/22 Yes Sharion Balloon, NP  rosuvastatin (CRESTOR) 10 MG tablet TAKE 1 TABLET(10 MG) BY MOUTH DAILY 09/07/21  Yes Glean Hess, MD  traZODone (DESYREL) 100 MG tablet    Yes [provider]  BD PEN NEEDLE NANO 2ND GEN 32G X 4 MM MISC USE DAILY AT 6AM 06/04/21   [provider]  FLUTICASONE PROPIONATE, NASAL, NA as needed. Patient not taking: Reported on 03/15/2022 08/16/20   [provider]  glucose blood (TRUE METRIX BLOOD GLUCOSE TEST) test strip Dx E11.9 10/25/19   Glean Hess, MD  sertraline (ZOLOFT) 100 MG tablet Take 150 mg by  mouth daily. 11/09/21   [provider]    Family History Family History  Problem Relation Age of Onset   Hyperlipidemia Mother    Hypertension Mother    Hyperlipidemia Father    Hypertension Father    Diabetes Father    Cancer Maternal Aunt 27       breast CA   Breast cancer Cousin    Breast cancer Other    Breast cancer Other     Social History Social History   Tobacco Use   Smoking status: Never   Smokeless tobacco: Never  Vaping Use   Vaping Use: Never used  Substance Use Topics   Alcohol use: Not Currently    Alcohol/week: 0.0 standard drinks of alcohol    Comment: rarely   Drug use: No     Allergies   Sulfa antibiotics, Doxycycline, Lisinopril, and Cefzil [cefprozil]   Review of Systems Review of Systems  Constitutional:  Negative for chills and fever.  HENT:  Positive for ear pain and sore throat.   Respiratory:  Positive for cough and wheezing. Negative for shortness of breath.   Cardiovascular:  Negative for chest pain and palpitations.  Gastrointestinal:  Negative for diarrhea and vomiting.  Skin:  Negative for color change and rash.  All other systems reviewed and are negative.    Physical Exam Triage Vital Signs ED Triage Vitals  Enc Vitals Group     BP 03/15/22 1327 127/76     Pulse Rate 03/15/22 1322 88     Resp 03/15/22 1322 18     Temp 03/15/22 1322 98.2 F (36.8 C)     Temp src --      SpO2 03/15/22 1322 95 %     Weight --      Height --      Head Circumference --      Peak Flow --      Pain Score 03/15/22 1327 6     Pain Loc --      Pain Edu? --      Excl. in [place]? --    No data found.  Updated Vital Signs BP 127/76 (BP Location: Left Arm)   Pulse 88   Temp 98.2 F (36.8 C)   Resp 18   LMP 01/17/2020 (Approximate)   SpO2 95%   Visual Acuity Right Eye Distance:   Left Eye Distance:   Bilateral Distance:    Right Eye Near:   Left Eye Near:    Bilateral Near:     Physical Exam Vitals and nursing note  reviewed.  Constitutional:      General: She is not in acute distress.    Appearance: Normal appearance. She is well-developed. She is not ill-appearing.  HENT:     Right Ear: Tympanic membrane normal.     Left  Ear: Tympanic membrane normal.     Nose: Nose normal.     Mouth/Throat:     Mouth: Mucous membranes are moist.     Pharynx: Oropharynx is clear.  Cardiovascular:     Rate and Rhythm: Normal rate and regular rhythm.     Heart sounds: Normal heart sounds.  Pulmonary:     Effort: Pulmonary effort is normal. No respiratory distress.     Breath sounds: Wheezing and rhonchi present.     Comments: Scattered rhonchi and wheezing throughout.  No respiratory distress. Musculoskeletal:     Cervical back: Neck supple.  Skin:    General: Skin is warm and dry.  Neurological:     Mental Status: She is alert.  Psychiatric:        Mood and Affect: Mood normal.        Behavior: Behavior normal.      UC Treatments / Results  Labs (all labs ordered are listed, but only abnormal results are displayed) Labs Reviewed - No data to display  EKG   Radiology DG Chest 2 View  Result Date: 03/15/2022 CLINICAL DATA:  Productive cough with bilateral ear pain, sore throat. EXAM: CHEST - 2 VIEW COMPARISON:  Radiographs 01/04/2021. FINDINGS: The heart size and mediastinal contours are stable. Suspected mild diffuse central airway thickening without hyperinflation, confluent airspace opacity, pleural effusion or pneumothorax. No acute osseous findings are evident. There are degenerative changes within the thoracic spine. IMPRESSION: Suspected mild central airway thickening suggesting bronchitis. No evidence of pneumonia. Electronically Signed   By: Richardean Sale M.D.   On: 03/15/2022 13:57    Procedures Procedures (including critical care time)  Medications Ordered in UC Medications - No data to display  Initial Impression / Assessment and Plan / UC Course  I have reviewed the triage vital  signs and the nursing notes.  Pertinent labs & imaging results that were available during my care of the patient were reviewed by me and considered in my medical decision making (see chart for details).   Productive cough, acute bronchitis.  Chest x-ray shows bronchial thickening but no pneumonia.  Instructed patient to continue using her albuterol inhaler.  Treating today with prednisone and Zithromax.  Instructed her to schedule a follow-up appointment with her PCP.  ED precautions discussed.  Education provided on bronchitis.  Patient agrees to plan of care   Final Clinical Impressions(s) / UC Diagnoses   Final diagnoses:  Productive cough  Acute bronchitis, unspecified organism     Discharge Instructions      Continue using your albuterol.  Take the prednisone and Zithromax.  Follow up with your primary care provider.        ED Prescriptions     Medication Sig Dispense Auth. Provider   predniSONE (DELTASONE) 10 MG tablet Take 4 tablets (40 mg total) by mouth daily for 5 days. 20 tablet Sharion Balloon, NP   azithromycin (ZITHROMAX) 250 MG tablet Take 1 tablet (250 mg total) by mouth daily. Take first 2 tablets together, then 1 every day until finished. 6 tablet Sharion Balloon, NP      PDMP not reviewed this encounter.   Sharion Balloon, NP 03/15/22 [phone]

## 2022-03-15 NOTE — Discharge Instructions (Addendum)
Continue using your albuterol.  Take the prednisone and Zithromax.  Follow up with your primary care provider.

## 2022-03-28 ENCOUNTER — Other Ambulatory Visit: Payer: Self-pay | Admitting: Internal Medicine

## 2022-03-30 ENCOUNTER — Other Ambulatory Visit: Payer: Self-pay | Admitting: Internal Medicine

## 2022-03-30 NOTE — Telephone Encounter (Signed)
Requested Prescriptions  Pending Prescriptions Disp Refills   montelukast (SINGULAIR) 10 MG tablet [Pharmacy Med Name: MONTELUKAST '10MG'$  TABLETS] 90 tablet 1    Sig: TAKE 1 TABLET BY MOUTH EVERY DAY     Pulmonology:  Leukotriene Inhibitors Passed - 03/30/2022  6:32 AM      Passed - Valid encounter within last 12 months    Recent Outpatient Visits           4 months ago Type II diabetes mellitus with complication [facility])   [facility] [facility] at [facility], Jesse Sans, MD   8 months ago Essential hypertension   [place] [facility] at [facility], Jesse Sans, MD   1 year ago Annual physical exam   [place] [facility] at [facility], Jesse Sans, MD   1 year ago Carpal tunnel syndrome of right wrist   [place] [facility] at [facility], Jesse Sans, MD   1 year ago Type II diabetes mellitus with complication [facility])   [place] [facility] at [facility], Jesse Sans, MD       Future Appointments             In 2 months Army Melia, Jesse Sans, MD [place] [facility] at [facility], [facility]

## 2022-04-07 ENCOUNTER — Ambulatory Visit
Admission: EM | Admit: 2022-04-07 | Discharge: 2022-04-07 | Disposition: A | Discharge status: Home / Self Care | Payer: 59 | Attending: Emergency Medicine | Admitting: Emergency Medicine

## 2022-04-07 DIAGNOSIS — Z20822 Contact with and (suspected) exposure to covid-19: Secondary | ICD-10-CM | POA: Diagnosis present

## 2022-04-07 DIAGNOSIS — B349 Viral infection, unspecified: Secondary | ICD-10-CM | POA: Insufficient documentation

## 2022-04-07 NOTE — ED Triage Notes (Signed)
Patient to Urgent Care with complaints of generalized body aches, headaches, sore throat, dry cough and chills. Possible fevers. Also reports decreased sense of taste.   Symptoms started Tuesday 12/19, worsened yesterday.   Recently exposed to her sister (over the weekend) who just tested positive for Covid.

## 2022-04-07 NOTE — ED Provider Notes (Signed)
[facility]    CSN: [id number] Arrival date & time: 04/07/22  1403      History   Chief Complaint Chief Complaint  Patient presents with   Generalized Body Aches    HPI Samantha Moses is a 51 y.o. female.  Patient presents with 2-day history of bodyaches, chills, headache, sore throat, cough.  She reports exposure to COVID.  She denies chest pain, shortness of breath, vomiting, diarrhea, or other symptoms.  Patient was seen here on 03/15/2022; diagnosed with productive cough and acute bronchitis; chest x-ray negative for pneumonia; treated with Zithromax and prednisone.  Her medical history includes diabetes, hypertension, obstructive sleep apnea, allergies.  The history is provided by the patient and medical records.    Past Medical History:  Diagnosis Date   ADD (attention deficit disorder)    Depression    Diabetes mellitus without complication ([place])    Hyperlipidemia    Hypertension     Patient Active Problem List   Diagnosis Date Noted   Close exposure to COVID-19 virus 04/07/2022   Carpal tunnel syndrome of right wrist 01/18/2021   Shoulder tendonitis, right 01/18/2021   OSA on CPAP 07/07/2020   Environmental and seasonal allergies 07/07/2020   Encounter for screening colonoscopy    Polyp of colon    Allergic rhinitis 08/22/2012   Hyperlipidemia associated with type 2 diabetes mellitus ([place])    Type II diabetes mellitus with complication ([place])    Essential hypertension    Adjustment reaction with anxiety and depression    ADD (attention deficit disorder)     Past Surgical History:  Procedure Laterality Date   COLONOSCOPY WITH PROPOFOL N/A 11/27/2019   Procedure: COLONOSCOPY WITH PROPOFOL;  Surgeon: Virgel Manifold, MD;  Location: [facility] ENDOSCOPY;  Service: Endoscopy;  Laterality: N/A;   none      OB History   No obstetric history on file.      Home Medications    Prior to Admission medications   Medication Sig Start Date End Date  Taking? Authorizing Provider  albuterol (PROVENTIL HFA;VENTOLIN HFA) 108 (90 BASE) MCG/ACT inhaler Inhale 2 puffs into the lungs every 4 (four) hours as needed for wheezing.     [provider]  amLODipine (NORVASC) 10 MG tablet TAKE 1 TABLET(10 MG) BY MOUTH DAILY 11/16/21   Glean Hess, MD  azithromycin (ZITHROMAX) 250 MG tablet Take 1 tablet (250 mg total) by mouth daily. Take first 2 tablets together, then 1 every day until finished. 03/15/22   Sharion Balloon, NP  FARXIGA 10 MG TABS tablet TAKE 1 TABLET(10 MG) BY MOUTH DAILY 01/21/22   Glean Hess, MD  FLUTICASONE PROPIONATE, NASAL, NA as needed. Patient not taking: Reported on 03/15/2022 08/16/20   [provider]  glucose blood (TRUE METRIX BLOOD GLUCOSE TEST) test strip Dx E11.9 10/25/19   Glean Hess, MD  Insulin Pen Needle (BD PEN NEEDLE NANO 2ND GEN) 32G X 4 MM MISC USE DAILY AT 6AM 03/28/22   Glean Hess, MD  LANTUS SOLOSTAR 100 UNIT/ML Solostar Pen ADMINISTER 20 UNITS UNDER THE SKIN DAILY Patient taking differently: Inject 10 Units into the skin daily. 01/21/22   Glean Hess, MD  lisdexamfetamine (VYVANSE) 30 MG capsule Take 30 mg by mouth daily. 02/07/22   [provider]  losartan (COZAAR) 100 MG tablet TAKE 1 TABLET BY MOUTH EVERY DAY 11/16/21   Glean Hess, MD  metFORMIN (GLUCOPHAGE) 1000 MG tablet TAKE 1 TABLET(1000 MG) BY  MOUTH TWICE DAILY WITH A MEAL 11/16/21   Glean Hess, MD  montelukast (SINGULAIR) 10 MG tablet TAKE 1 TABLET BY MOUTH EVERY DAY 03/30/22   Glean Hess, MD  Multiple Vitamin (MULTIVITAMIN) tablet Take 1 tablet by mouth daily.    [provider]  Omega-3 Fatty Acids (FISH OIL TRIPLE STRENGTH) 1400 MG CAPS Take by mouth 2 (two) times daily.    [provider]  rosuvastatin (CRESTOR) 10 MG tablet TAKE 1 TABLET(10 MG) BY MOUTH DAILY 09/07/21   Glean Hess, MD  sertraline (ZOLOFT) 100 MG tablet Take 150 mg by mouth daily. 11/09/21    [provider]  traZODone (DESYREL) 100 MG tablet     [provider]    Family History Family History  Problem Relation Age of Onset   Hyperlipidemia Mother    Hypertension Mother    Hyperlipidemia Father    Hypertension Father    Diabetes Father    Cancer Maternal Aunt 33       breast CA   Breast cancer Cousin    Breast cancer Other    Breast cancer Other     Social History Social History   Tobacco Use   Smoking status: Never   Smokeless tobacco: Never  Vaping Use   Vaping Use: Never used  Substance Use Topics   Alcohol use: Not Currently    Alcohol/week: 0.0 standard drinks of alcohol    Comment: rarely   Drug use: No     Allergies   Sulfa antibiotics, Doxycycline, Lisinopril, and Cefzil [cefprozil]   Review of Systems Review of Systems  Constitutional:  Positive for chills. Negative for fever.  HENT:  Positive for sore throat. Negative for ear pain.   Respiratory:  Positive for cough. Negative for shortness of breath.   Cardiovascular:  Negative for chest pain and palpitations.  Gastrointestinal:  Negative for abdominal pain, diarrhea and vomiting.  Skin:  Negative for color change and rash.  Neurological:  Positive for headaches.  All other systems reviewed and are negative.    Physical Exam Triage Vital Signs ED Triage Vitals  Enc Vitals Group     BP 04/07/22 1430 112/72     Pulse Rate 04/07/22 1430 81     Resp 04/07/22 1430 19     Temp 04/07/22 1430 98.8 F (37.1 C)     Temp src --      SpO2 04/07/22 1430 95 %     Weight 04/07/22 1427 216 lb (98 kg)     Height 04/07/22 1427 '5\' 3"'$  (1.6 m)     Head Circumference --      Peak Flow --      Pain Score 04/07/22 1427 2     Pain Loc --      Pain Edu? --      Excl. in [place]? --    No data found.  Updated Vital Signs BP 112/72   Pulse 81   Temp 98.8 F (37.1 C)   Resp 19   Ht '5\' 3"'$  (1.6 m)   Wt 216 lb (98 kg)   LMP 01/17/2020 (Approximate)   SpO2 95%   BMI 38.26 kg/m    Visual Acuity Right Eye Distance:   Left Eye Distance:   Bilateral Distance:    Right Eye Near:   Left Eye Near:    Bilateral Near:     Physical Exam Vitals and nursing note reviewed.  Constitutional:      General: She is  not in acute distress.    Appearance: She is well-developed. She is not ill-appearing.  HENT:     Right Ear: Tympanic membrane normal.     Left Ear: Tympanic membrane normal.     Nose: Nose normal.     Mouth/Throat:     Mouth: Mucous membranes are moist.     Pharynx: Oropharynx is clear.  Cardiovascular:     Rate and Rhythm: Normal rate and regular rhythm.     Heart sounds: Normal heart sounds.  Pulmonary:     Effort: Pulmonary effort is normal. No respiratory distress.     Breath sounds: Normal breath sounds.  Musculoskeletal:     Cervical back: Neck supple.  Skin:    General: Skin is warm and dry.  Neurological:     Mental Status: She is alert.  Psychiatric:        Mood and Affect: Mood normal.        Behavior: Behavior normal.      UC Treatments / Results  Labs (all labs ordered are listed, but only abnormal results are displayed) Labs Reviewed  SARS CORONAVIRUS 2 (TAT 6-24 HRS)    EKG   Radiology No results found.  Procedures Procedures (including critical care time)  Medications Ordered in UC Medications - No data to display  Initial Impression / Assessment and Plan / UC Course  I have reviewed the triage vital signs and the nursing notes.  Pertinent labs & imaging results that were available during my care of the patient were reviewed by me and considered in my medical decision making (see chart for details).    Exposure to COVID, viral illness.  COVID pending.  Discussed symptomatic treatment including Tylenol or ibuprofen, rest, hydration.  Instructed patient to follow up with PCP if symptoms are not improving.  She agrees to plan of care.   Final Clinical Impressions(s) / UC Diagnoses   Final diagnoses:  Close  exposure to COVID-19 virus  Viral illness     Discharge Instructions      Your COVID test is pending.    Take Tylenol or ibuprofen as needed for fever or discomfort.  Rest and keep yourself hydrated.    Follow-up with your primary care provider if your symptoms are not improving.         ED Prescriptions   None    PDMP not reviewed this encounter.   Sharion Balloon, NP 04/07/22 [phone]

## 2022-04-07 NOTE — Discharge Instructions (Signed)
Your COVID test is pending.    Take Tylenol or ibuprofen as needed for fever or discomfort.  Rest and keep yourself hydrated.    Follow-up with your primary care provider if your symptoms are not improving.

## 2022-04-08 LAB — SARS CORONAVIRUS 2 (TAT 6-24 HRS): SARS Coronavirus 2: NEGATIVE

## 2022-04-13 ENCOUNTER — Other Ambulatory Visit: Payer: Self-pay

## 2022-04-13 MED ORDER — LISDEXAMFETAMINE DIMESYLATE 30 MG PO CAPS
ORAL_CAPSULE | Freq: Every day | ORAL | 0 refills | Status: AC
  Filled 2022-04-13 (×2): qty 30, 30d supply, fill #0

## 2022-04-27 ENCOUNTER — Other Ambulatory Visit: Payer: Self-pay | Admitting: Internal Medicine

## 2022-04-27 DIAGNOSIS — E118 Type 2 diabetes mellitus with unspecified complications: Secondary | ICD-10-CM

## 2022-05-13 ENCOUNTER — Other Ambulatory Visit: Payer: Self-pay | Admitting: Internal Medicine

## 2022-05-13 DIAGNOSIS — E118 Type 2 diabetes mellitus with unspecified complications: Secondary | ICD-10-CM

## 2022-05-13 DIAGNOSIS — I10 Essential (primary) hypertension: Secondary | ICD-10-CM

## 2022-05-13 NOTE — Telephone Encounter (Signed)
Requested Prescriptions  Pending Prescriptions Disp Refills   amLODipine (NORVASC) 10 MG tablet [Pharmacy Med Name: AMLODIPINE BESYLATE '10MG'$  TABLETS] 90 tablet 0    Sig: TAKE 1 TABLET(10 MG) BY MOUTH DAILY     Cardiovascular: Calcium Channel Blockers 2 Failed - 05/13/2022  6:31 AM      Failed - Valid encounter within last 6 months    Recent Outpatient Visits           6 months ago Type II diabetes mellitus with complication [facility])   [place] [facility] at [facility], Jesse Sans, MD   10 months ago Essential hypertension   [place] [facility] at [facility], Jesse Sans, MD   1 year ago Annual physical exam   [place] [facility] at [facility], Jesse Sans, MD   1 year ago Carpal tunnel syndrome of right wrist   [place] [facility] at [facility], Jesse Sans, MD   1 year ago Type II diabetes mellitus with complication [facility])   [place] [facility] at [facility], Jesse Sans, MD       Future Appointments             In 1 month Army Melia, Jesse Sans, MD [facility] at [facility], [place] BP in normal range    BP Readings from Last 1 Encounters:  04/07/22 112/72         Passed - Last Heart Rate in normal range    Pulse Readings from Last 1 Encounters:  04/07/22 81          metFORMIN (GLUCOPHAGE) 1000 MG tablet [Pharmacy Med Name: METFORMIN '1000MG'$  TABLETS] 180 tablet 0    Sig: TAKE 1 TABLET(1000 MG) BY MOUTH TWICE DAILY WITH A MEAL     Endocrinology:  Diabetes - Biguanides Failed - 05/13/2022  6:31 AM      Failed - HBA1C is between 0 and 7.9 and within 180 days    Hemoglobin A1C  Date Value Ref Range Status  11/10/2021 5.8 (A) 4.0 - 5.6 % Final   HbA1c POC (<> result, manual entry)  Date Value Ref Range Status  06/11/2018 9.5 4.0 - 5.6 % Final   Hgb  A1c MFr Bld  Date Value Ref Range Status  07/09/2021 8.9 (H) 4.8 - 5.6 % Final    Comment:             Prediabetes: 5.7 - 6.4          Diabetes: >6.4          Glycemic control for adults with diabetes: <7.0          Failed - B12 Level in normal range and within 720 days    No results found for: "VITAMINB12"       Failed - Valid encounter within last 6 months    Recent Outpatient Visits           6 months ago Type II diabetes mellitus with complication [facility])   [place] [facility] at [facility], Jesse Sans, MD   10 months ago Essential hypertension   [place] [facility] [place] at [facility], Jesse Sans, MD   1 year ago Annual physical exam  [place] [facility] at [facility], Jesse Sans, MD   1 year ago Carpal tunnel syndrome of right wrist   [place] [facility] at [facility], Jesse Sans, MD   1 year ago Type II diabetes mellitus with complication [facility])   [place] [facility] at [facility], Jesse Sans, MD       Future Appointments             In 1 month Army Melia Jesse Sans, MD [facility] Primary Care & Sports Medicine at [facility]            Failed - CBC within normal limits and completed in the last 12 months    WBC  Date Value Ref Range Status  03/10/2021 8.1 3.4 - 10.8 x10E3/uL Final  01/04/2021 8.1 4.0 - 10.5 K/uL Final   RBC  Date Value Ref Range Status  03/10/2021 4.81 3.77 - 5.28 x10E6/uL Final  01/04/2021 4.74 3.87 - 5.11 MIL/uL Final   Hemoglobin  Date Value Ref Range Status  03/10/2021 14.2 11.1 - 15.9 g/dL Final   Hematocrit  Date Value Ref Range Status  03/10/2021 42.5 34.0 - 46.6 % Final   MCHC  Date Value Ref Range Status  03/10/2021 33.4 31.5 - 35.7 g/dL Final  01/04/2021 35.1 30.0 - 36.0 g/dL Final   [facility]  Date Value Ref Range Status  03/10/2021 29.5 26.6 - 33.0 pg  Final  01/04/2021 31.0 26.0 - 34.0 pg Final   MCV  Date Value Ref Range Status  03/10/2021 88 79 - 97 fL Final   No results found for: "PLTCOUNTKUC", "LABPLAT", "POCPLA" RDW  Date Value Ref Range Status  03/10/2021 12.6 11.7 - 15.4 % Final         Passed - Cr in normal range and within 360 days    Creatinine, Ser  Date Value Ref Range Status  07/09/2021 0.91 0.57 - 1.00 mg/dL Final   Creatinine,U  Date Value Ref Range Status  06/11/2018 68.1 mg/dL Final         Passed - eGFR in normal range and within 360 days    GFR calc Af Amer  Date Value Ref Range Status  03/04/2020 83 >59 mL/min/1.73 Final    Comment:    **In accordance with recommendations from the NKF-ASN Task force,**   [facility] is in the process of updating its eGFR calculation to the   2021 CKD-EPI creatinine equation that estimates kidney function   without a race variable.    GFR, Estimated  Date Value Ref Range Status  01/04/2021 >60 >60 mL/min Final    Comment:    (NOTE) Calculated using the CKD-EPI Creatinine Equation (2021)    GFR  Date Value Ref Range Status  06/11/2018 60.41 >60.00 mL/min Final   eGFR  Date Value Ref Range Status  07/09/2021 76 >59 mL/min/1.73 Final          losartan (COZAAR) 100 MG tablet [Pharmacy Med Name: LOSARTAN '100MG'$  TABLETS] 90 tablet 0    Sig: TAKE 1 TABLET BY MOUTH EVERY DAY     Cardiovascular:  Angiotensin Receptor Blockers Failed - 05/13/2022  6:31 AM      Failed - Cr in normal range and within 180 days    Creatinine, Ser  Date Value Ref Range Status  07/09/2021 0.91 0.57 - 1.00 mg/dL Final   Creatinine,U  Date Value Ref Range Status  06/11/2018 68.1  mg/dL Final         Failed - K in normal range and within 180 days    Potassium  Date Value Ref Range Status  07/09/2021 4.5 3.5 - 5.2 mmol/L Final         Failed - Valid encounter within last 6 months    Recent Outpatient Visits           6 months ago Type II diabetes mellitus with complication  [facility])   [place] [facility] at [facility], Jesse Sans, MD   10 months ago Essential hypertension   [place] [facility] at [facility], Jesse Sans, MD   1 year ago Annual physical exam   [facility] at [facility], Jesse Sans, MD   1 year ago Carpal tunnel syndrome of right wrist    Primary Care & Sports Medicine at [facility], Jesse Sans, MD   1 year ago Type II diabetes mellitus with complication [facility])   [place] at [facility], Jesse Sans, MD       Future Appointments             In 1 month Army Melia Jesse Sans, MD [place] at [facility] [place] - Patient is not pregnant      Passed - Last BP in normal range    BP Readings from Last 1 Encounters:  04/07/22 112/72

## 2022-06-21 ENCOUNTER — Ambulatory Visit (INDEPENDENT_AMBULATORY_CARE_PROVIDER_SITE_OTHER): Payer: 59 | Admitting: Internal Medicine

## 2022-06-21 ENCOUNTER — Encounter: Payer: Self-pay | Admitting: Internal Medicine

## 2022-06-21 ENCOUNTER — Other Ambulatory Visit (HOSPITAL_COMMUNITY)
Admission: RE | Admit: 2022-06-21 | Discharge: 2022-06-21 | Disposition: A | Discharge status: Home / Self Care | Payer: 59 | Source: Ambulatory Visit | Attending: Internal Medicine | Admitting: Internal Medicine

## 2022-06-21 VITALS — BP 126/78 | HR 85 | Ht 63.0 in | Wt 220.0 lb

## 2022-06-21 DIAGNOSIS — Z124 Encounter for screening for malignant neoplasm of cervix: Secondary | ICD-10-CM | POA: Diagnosis not present

## 2022-06-21 DIAGNOSIS — E1169 Type 2 diabetes mellitus with other specified complication: Secondary | ICD-10-CM | POA: Diagnosis not present

## 2022-06-21 DIAGNOSIS — E118 Type 2 diabetes mellitus with unspecified complications: Secondary | ICD-10-CM | POA: Diagnosis not present

## 2022-06-21 DIAGNOSIS — I10 Essential (primary) hypertension: Secondary | ICD-10-CM

## 2022-06-21 DIAGNOSIS — Z1231 Encounter for screening mammogram for malignant neoplasm of breast: Secondary | ICD-10-CM

## 2022-06-21 DIAGNOSIS — Z1211 Encounter for screening for malignant neoplasm of colon: Secondary | ICD-10-CM | POA: Insufficient documentation

## 2022-06-21 DIAGNOSIS — F4323 Adjustment disorder with mixed anxiety and depressed mood: Secondary | ICD-10-CM | POA: Diagnosis not present

## 2022-06-21 DIAGNOSIS — Z0000 Encounter for general adult medical examination without abnormal findings: Secondary | ICD-10-CM

## 2022-06-21 DIAGNOSIS — E785 Hyperlipidemia, unspecified: Secondary | ICD-10-CM

## 2022-06-21 MED ORDER — AMLODIPINE BESYLATE 10 MG PO TABS: ORAL_TABLET | ORAL | 3 refills | Status: DC

## 2022-06-21 MED ORDER — DAPAGLIFLOZIN PROPANEDIOL 10 MG PO TABS: ORAL_TABLET | ORAL | 3 refills | Status: DC

## 2022-06-21 MED ORDER — ROSUVASTATIN CALCIUM 10 MG PO TABS: ORAL_TABLET | ORAL | 3 refills | Status: DC

## 2022-06-21 MED ORDER — LANTUS SOLOSTAR 100 UNIT/ML ~~LOC~~ SOPN: 20.0000 [IU] | PEN_INJECTOR | Freq: Every day | SUBCUTANEOUS | 0 refills | Status: DC

## 2022-06-21 MED ORDER — LOSARTAN POTASSIUM 100 MG PO TABS: 100.0000 mg | ORAL_TABLET | Freq: Every day | ORAL | 3 refills | Status: DC

## 2022-06-21 MED ORDER — MONTELUKAST SODIUM 10 MG PO TABS: 10.0000 mg | ORAL_TABLET | Freq: Every day | ORAL | 3 refills | Status: DC

## 2022-06-21 MED ORDER — DEXCOM G7 SENSOR MISC: 1.0000 | 0 refills | Status: DC

## 2022-06-21 MED ORDER — METFORMIN HCL 1000 MG PO TABS: ORAL_TABLET | ORAL | 3 refills | Status: DC

## 2022-06-21 NOTE — Assessment & Plan Note (Signed)
Tolerating statin medications without concerns - Crestor 10 mg LDL is  Lab Results  Component Value Date   LDLCALC 84 07/09/2021   with a goal of < 70. Current dose will be adjusted if needed.

## 2022-06-21 NOTE — Assessment & Plan Note (Signed)
Difficult year in 2023 - took a leave of absence and had medications adjusted.  Zoloft increased and Vyvanse restarted She is doing a bit better now. During that time she stopped checking her BS or paying attention to her diet and gained 20 lbs.

## 2022-06-21 NOTE — Assessment & Plan Note (Signed)
Clinically stable exam with well controlled BP on losartan and amlodipine. Tolerating medications without side effects. Pt to continue current regimen and low sodium diet.

## 2022-06-21 NOTE — Assessment & Plan Note (Addendum)
Clinically stable without s/s of hypoglycemia. She stopped checking her BS and has not been working with diet Tolerating insulin, Farxiga and metformin well without side effects or other concerns. Lab Results  Component Value Date   HGBA1C 5.8 (A) 11/10/2021  Will check labs - encourage her to resume BS testing - DEXCOM prescribed Resume healthy low carb diet Move insulin dose to PM for better compliance

## 2022-06-21 NOTE — Progress Notes (Addendum)
Date:  06/21/2022   Name:  Samantha Moses   DOB:  Feb 10, 1971   MRN:  [id number]   Chief Complaint: Annual Exam (Breast exam with pap ) Samantha SEEHAFER is a 52 y.o. female who presents today for her Complete Annual Exam. She feels well. She reports exercising - none. She reports she is sleeping well. Breast complaints - none.  Mammogram: 05/2021 DEXA: none Pap smear: 04/2017 neg/neg Colonoscopy: 11/2019 repeat 3 yrs  Health Maintenance Due  Topic Date Due   OPHTHALMOLOGY EXAM  11/11/2021   COVID-19 Vaccine (5 - 2023-24 season) 12/17/2021   HEMOGLOBIN A1C  05/13/2022   MAMMOGRAM  06/10/2022   PAP SMEAR-Modifier  04/25/2022   Diabetic kidney evaluation - eGFR measurement  07/10/2022   Diabetic kidney evaluation - Urine ACR  07/10/2022    Immunization History  Administered Date(s) Administered   Influenza, Quadrivalent, Recombinant, Inj, Pf 02/24/2017   Influenza,inj,Quad PF,6+ Mos 01/17/2018, 01/05/2019   Influenza-Unspecified 02/16/2015, 01/17/2018, 01/02/2020, 01/31/2021, 04/04/2022   Moderna Sars-Covid-2 Vaccination 05/02/2019, 05/30/2019, 02/09/2020, 11/03/2020   PNEUMOCOCCAL CONJUGATE-20 04/04/2022   Pneumococcal Polysaccharide-23 06/13/2014   Tdap 11/01/2012   Zoster Recombinat (Shingrix) 10/02/2020, 01/31/2021    Hypertension This is a chronic problem. The problem is controlled. Pertinent negatives include no chest pain, headaches, palpitations or shortness of breath. Past treatments include angiotensin blockers and calcium channel blockers. The current treatment provides significant improvement. There is no history of kidney disease, CAD/MI or CVA.  Diabetes She presents for her follow-up diabetic visit. She has type 2 diabetes mellitus. Her disease course has been stable. Pertinent negatives for hypoglycemia include no dizziness, headaches, nervousness/anxiousness or tremors. Pertinent negatives for diabetes include no chest pain, no fatigue, no polydipsia and no polyuria.  Pertinent negatives for diabetic complications include no CVA. Current diabetic treatment includes insulin injections and oral agent (dual therapy). She is compliant with treatment all of the time. An ACE inhibitor/angiotensin II receptor blocker is being taken. Eye exam is not current.  Hyperlipidemia This is a chronic problem. The problem is controlled. Pertinent negatives include no chest pain or shortness of breath. Current antihyperlipidemic treatment includes statins.  Depression        This is a chronic problem.  The problem has been gradually improving since onset.  Associated symptoms include no fatigue and no headaches.  Past treatments include SSRIs - Selective serotonin reuptake inhibitors (Vyvanse and Trazodone).  Compliance with treatment is good.  Previous treatment provided moderate relief.   Lab Results  Component Value Date   NA 139 07/09/2021   K 4.5 07/09/2021   CO2 25 07/09/2021   GLUCOSE 143 (H) 07/09/2021   BUN 20 07/09/2021   CREATININE 0.91 07/09/2021   CALCIUM 9.6 07/09/2021   EGFR 76 07/09/2021   GFRNONAA >60 01/04/2021   Lab Results  Component Value Date   CHOL 180 07/09/2021   HDL 52 07/09/2021   LDLCALC 84 07/09/2021   LDLDIRECT 151.0 06/11/2018   TRIG 269 (H) 07/09/2021   CHOLHDL 3.5 07/09/2021   Lab Results  Component Value Date   TSH 1.900 03/10/2021   Lab Results  Component Value Date   HGBA1C 5.8 (A) 11/10/2021   Lab Results  Component Value Date   WBC 8.1 03/10/2021   HGB 14.2 03/10/2021   HCT 42.5 03/10/2021   MCV 88 03/10/2021   PLT 230 03/10/2021   Lab Results  Component Value Date   ALT 47 (H) 07/09/2021   AST 29 07/09/2021  ALKPHOS 82 07/09/2021   BILITOT 0.3 07/09/2021   No results found for: "25OHVITD2", "25OHVITD3", "VD25OH"   Review of Systems  Constitutional:  Positive for unexpected weight change. Negative for chills, fatigue and fever.  HENT:  Negative for congestion, hearing loss, tinnitus, trouble swallowing and  voice change.   Eyes:  Negative for visual disturbance.  Respiratory:  Negative for cough, chest tightness, shortness of breath and wheezing.   Cardiovascular:  Negative for chest pain, palpitations and leg swelling.  Gastrointestinal:  Negative for abdominal pain, constipation, diarrhea and vomiting.  Endocrine: Negative for polydipsia and polyuria.  Genitourinary:  Negative for dysuria, frequency, genital sores, vaginal bleeding and vaginal discharge.  Musculoskeletal:  Negative for arthralgias, gait problem and joint swelling.  Skin:  Positive for wound (anterior left lower leg). Negative for color change and rash.  Neurological:  Negative for dizziness, tremors, light-headedness and headaches.  Hematological:  Negative for adenopathy. Does not bruise/bleed easily.  Psychiatric/Behavioral:  Positive for depression. Negative for dysphoric mood and sleep disturbance. The patient is not nervous/anxious.     Patient Active Problem List   Diagnosis Date Noted   Close exposure to COVID-19 virus 04/07/2022   Carpal tunnel syndrome of right wrist 01/18/2021   Shoulder tendonitis, right 01/18/2021   OSA on CPAP 07/07/2020   Environmental and seasonal allergies 07/07/2020   Encounter for screening colonoscopy    Polyp of colon    Allergic rhinitis 08/22/2012   Hyperlipidemia associated with type 2 diabetes mellitus ([place])    Type II diabetes mellitus with complication ([place])    Essential hypertension    Adjustment reaction with anxiety and depression    ADD (attention deficit disorder)     Allergies  Allergen Reactions   Sulfa Antibiotics Anaphylaxis   Doxycycline     rash   Lisinopril     cough   Cefzil [Cefprozil] Rash    Past Surgical History:  Procedure Laterality Date   COLONOSCOPY WITH PROPOFOL N/A 11/27/2019   Procedure: COLONOSCOPY WITH PROPOFOL;  Surgeon: Virgel Manifold, MD;  Location: [facility] ENDOSCOPY;  Service: Endoscopy;  Laterality: N/A;   none      Social  History   Tobacco Use   Smoking status: Never   Smokeless tobacco: Never  Vaping Use   Vaping Use: Never used  Substance Use Topics   Alcohol use: Not Currently    Alcohol/week: 0.0 standard drinks of alcohol    Comment: rarely   Drug use: No     Medication list has been reviewed and updated.  Current Meds  Medication Sig   albuterol (PROVENTIL HFA;VENTOLIN HFA) 108 (90 BASE) MCG/ACT inhaler Inhale 2 puffs into the lungs every 4 (four) hours as needed for wheezing.    Continuous Blood Gluc Sensor (DEXCOM G7 SENSOR) MISC Place 1 each onto the skin every 14 (fourteen) days.   glucose blood (TRUE METRIX BLOOD GLUCOSE TEST) test strip Dx E11.9   Insulin Pen Needle (BD PEN NEEDLE NANO 2ND GEN) 32G X 4 MM MISC USE DAILY AT 6AM   lisdexamfetamine (VYVANSE) 30 MG capsule TAKE 1 CAPSULE BY MOUTH DAILY   Multiple Vitamin (MULTIVITAMIN) tablet Take 1 tablet by mouth daily.   Omega-3 Fatty Acids (FISH OIL TRIPLE STRENGTH) 1400 MG CAPS Take by mouth 2 (two) times daily.   sertraline (ZOLOFT) 100 MG tablet Take 150 mg by mouth daily.   traZODone (DESYREL) 100 MG tablet    [DISCONTINUED] amLODipine (NORVASC) 10 MG tablet TAKE 1 TABLET(10 MG) BY MOUTH  DAILY   [DISCONTINUED] FARXIGA 10 MG TABS tablet TAKE 1 TABLET(10 MG) BY MOUTH DAILY   [DISCONTINUED] LANTUS SOLOSTAR 100 UNIT/ML Solostar Pen ADMINISTER 20 UNITS UNDER THE SKIN DAILY (Patient taking differently: Inject 10 Units into the skin daily.)   [DISCONTINUED] losartan (COZAAR) 100 MG tablet TAKE 1 TABLET BY MOUTH EVERY DAY   [DISCONTINUED] metFORMIN (GLUCOPHAGE) 1000 MG tablet TAKE 1 TABLET(1000 MG) BY MOUTH TWICE DAILY WITH A MEAL   [DISCONTINUED] montelukast (SINGULAIR) 10 MG tablet TAKE 1 TABLET BY MOUTH EVERY DAY   [DISCONTINUED] rosuvastatin (CRESTOR) 10 MG tablet TAKE 1 TABLET(10 MG) BY MOUTH DAILY       06/21/2022   10:53 AM 11/10/2021   11:11 AM 07/09/2021    1:34 PM 03/10/2021    8:46 AM  GAD 7 : Generalized Anxiety Score   Nervous, Anxious, on Edge 0 1 2 0  Control/stop worrying 0 0 0 0  Worry too much - different things 0 1 1 0  Trouble relaxing 0 0 1 0  Restless 0 1 0 0  Easily annoyed or irritable 0 1 2 0  Afraid - awful might happen 0 0 1 0  Total GAD 7 Score 0 4 7 0  Anxiety Difficulty Not difficult at all Not difficult at all Somewhat difficult Not difficult at all       06/21/2022   10:52 AM 11/10/2021   11:11 AM 07/09/2021    1:33 PM  Depression screen PHQ 2/9  Decreased Interest 0 0 1  Down, Depressed, Hopeless 0 0 1  PHQ - 2 Score 0 0 2  Altered sleeping '1 1 1  '$ Tired, decreased energy 1 0 2  Change in appetite '1 1 3  '$ Feeling bad or failure about yourself  0 0 0  Trouble concentrating 0 1 1  Moving slowly or fidgety/restless 0 1 0  Suicidal thoughts 0 0 0  PHQ-9 Score '3 4 9  '$ Difficult doing work/chores Somewhat difficult Somewhat difficult Not difficult at all    BP Readings from Last 3 Encounters:  06/21/22 126/78  04/07/22 112/72  03/15/22 127/76    Physical Exam Vitals and nursing note reviewed.  Constitutional:      General: She is not in acute distress.    Appearance: She is well-developed.  HENT:     Head: Normocephalic and atraumatic.     Right Ear: Tympanic membrane and ear canal normal.     Left Ear: Tympanic membrane and ear canal normal.     Nose:     Right Sinus: No maxillary sinus tenderness.     Left Sinus: No maxillary sinus tenderness.  Eyes:     General: No scleral icterus.       Right eye: No discharge.        Left eye: No discharge.     Conjunctiva/sclera: Conjunctivae normal.  Neck:     Thyroid: No thyromegaly.     Vascular: No carotid bruit.  Cardiovascular:     Rate and Rhythm: Normal rate and regular rhythm.     Pulses: Normal pulses.     Heart sounds: Normal heart sounds.  Pulmonary:     Effort: Pulmonary effort is normal. No respiratory distress.     Breath sounds: No wheezing.  Chest:  Breasts:    Right: No mass, nipple discharge, skin  change or tenderness.     Left: No mass, nipple discharge, skin change or tenderness.  Abdominal:     General: Bowel sounds are normal.  Palpations: Abdomen is soft.     Tenderness: There is no abdominal tenderness.  Genitourinary:    Labia:        Right: No tenderness, lesion or injury.        Left: No tenderness, lesion or injury.      Vagina: Normal.     Cervix: Normal.     Uterus: Normal.      Adnexa: Right adnexa normal and left adnexa normal.     Comments: Pap obtained Musculoskeletal:     Cervical back: Normal range of motion. No erythema.     Right lower leg: No edema.     Left lower leg: No edema.  Lymphadenopathy:     Cervical: No cervical adenopathy.  Skin:    General: Skin is warm and dry.     Findings: No rash.          Comments: Healing abrasion with eschar   Neurological:     Mental Status: She is alert and oriented to person, place, and time.     Cranial Nerves: No cranial nerve deficit.     Sensory: No sensory deficit.     Deep Tendon Reflexes: Reflexes are normal and symmetric.  Psychiatric:        Attention and Perception: Attention normal.        Mood and Affect: Mood normal.    Diabetic Foot Exam - Simple   Simple Foot Form Diabetic Foot exam was performed with the following findings: Yes 06/21/2022 11:46 AM  Visual Inspection No deformities, no ulcerations, no other skin breakdown bilaterally: Yes Sensation Testing Intact to touch and monofilament testing bilaterally: Yes Pulse Check Posterior Tibialis and Dorsalis pulse intact bilaterally: Yes Comments      Wt Readings from Last 3 Encounters:  06/21/22 220 lb (99.8 kg)  04/07/22 216 lb (98 kg)  11/10/21 197 lb (89.4 kg)    BP 126/78   Pulse 85   Ht '5\' 3"'$  (1.6 m)   Wt 220 lb (99.8 kg)   LMP 01/17/2020 (Approximate)   SpO2 95%   BMI 38.97 kg/m   Assessment and Plan: Problem List Items Addressed This Visit       Cardiovascular and Mediastinum   Essential hypertension  (Chronic)    Clinically stable exam with well controlled BP on losartan and amlodipine. Tolerating medications without side effects. Pt to continue current regimen and low sodium diet.       Relevant Medications   losartan (COZAAR) 100 MG tablet   rosuvastatin (CRESTOR) 10 MG tablet   amLODipine (NORVASC) 10 MG tablet   Other Relevant Orders   CBC with Differential/Platelet   Comprehensive metabolic panel   TSH     Endocrine   Hyperlipidemia associated with type 2 diabetes mellitus (HCC) (Chronic)    Tolerating statin medications without concerns - Crestor 10 mg LDL is  Lab Results  Component Value Date   LDLCALC 84 07/09/2021  with a goal of < 70. Current dose will be adjusted if needed.       Relevant Medications   losartan (COZAAR) 100 MG tablet   rosuvastatin (CRESTOR) 10 MG tablet   amLODipine (NORVASC) 10 MG tablet   dapagliflozin propanediol (FARXIGA) 10 MG TABS tablet   metFORMIN (GLUCOPHAGE) 1000 MG tablet   insulin glargine (LANTUS SOLOSTAR) 100 UNIT/ML Solostar Pen   Other Relevant Orders   Lipid panel   Type II diabetes mellitus with complication (HCC) (Chronic)    Clinically stable without s/s of hypoglycemia. She  stopped checking her BS and has not been working with diet Tolerating insulin, Farxiga and metformin well without side effects or other concerns. Lab Results  Component Value Date   HGBA1C 5.8 (A) 11/10/2021  Will check labs - encourage her to resume BS testing - DEXCOM prescribed Resume healthy low carb diet Move insulin dose to PM for better compliance       Relevant Medications   losartan (COZAAR) 100 MG tablet   rosuvastatin (CRESTOR) 10 MG tablet   amLODipine (NORVASC) 10 MG tablet   dapagliflozin propanediol (FARXIGA) 10 MG TABS tablet   metFORMIN (GLUCOPHAGE) 1000 MG tablet   insulin glargine (LANTUS SOLOSTAR) 100 UNIT/ML Solostar Pen   Continuous Blood Gluc Sensor (DEXCOM G7 SENSOR) MISC   Other Relevant Orders   Hemoglobin A1c    Microalbumin / creatinine urine ratio     Other   Adjustment reaction with anxiety and depression (Chronic)    Difficult year in 2023 - took a leave of absence and had medications adjusted.  Zoloft increased and Vyvanse restarted She is doing a bit better now. During that time she stopped checking her BS or paying attention to her diet and gained 20 lbs.      Other Visit Diagnoses     Annual physical exam    -  Primary   up to date on immunizations will schedule mammogram refer for colonoscopy   Relevant Orders   CBC with Differential/Platelet   Comprehensive metabolic panel   Hemoglobin A1c   Lipid panel   TSH   Encounter for screening mammogram for breast cancer       Relevant Orders   MM 3D SCREENING MAMMOGRAM BILATERAL BREAST   Colon cancer screening       Relevant Orders   Ambulatory referral to Gastroenterology   Encounter for screening for cervical cancer       pap obtained   Relevant Orders   Cytology - PAP        Partially dictated using Dragon software. Any errors are unintentional.  Halina Maidens, MD [facility]  06/21/2022

## 2022-06-22 ENCOUNTER — Other Ambulatory Visit: Payer: Self-pay

## 2022-06-22 ENCOUNTER — Telehealth: Payer: Self-pay

## 2022-06-22 DIAGNOSIS — Z86010 Personal history of colonic polyps: Secondary | ICD-10-CM

## 2022-06-22 LAB — HEMOGLOBIN A1C
Est. average glucose Bld gHb Est-mCnc: 186 mg/dL
Hgb A1c MFr Bld: 8.1 % — ABNORMAL HIGH (ref 4.8–5.6)

## 2022-06-22 LAB — LIPID PANEL
Chol/HDL Ratio: 3.3 ratio (ref 0.0–4.4)
Cholesterol, Total: 164 mg/dL (ref 100–199)
HDL: 49 mg/dL (ref >39)
LDL Chol Calc (NIH): 78 mg/dL (ref 0–99)
Triglycerides: 226 mg/dL — ABNORMAL HIGH (ref 0–149)
VLDL Cholesterol Cal: 37 mg/dL (ref 5–40)

## 2022-06-22 LAB — CBC WITH DIFFERENTIAL/PLATELET
Basophils Absolute: 0.1 10*3/uL (ref 0.0–0.2)
Basos: 1 %
EOS (ABSOLUTE): 1.2 10*3/uL — ABNORMAL HIGH (ref 0.0–0.4)
Eos: 16 %
Hematocrit: 46 % (ref 34.0–46.6)
Hemoglobin: 15.5 g/dL (ref 11.1–15.9)
Immature Grans (Abs): 0 10*3/uL (ref 0.0–0.1)
Immature Granulocytes: 0 %
Lymphocytes Absolute: 2.5 10*3/uL (ref 0.7–3.1)
Lymphs: 33 %
MCH: 29.5 pg (ref 26.6–33.0)
MCHC: 33.7 g/dL (ref 31.5–35.7)
MCV: 88 fL (ref 79–97)
Monocytes Absolute: 0.5 10*3/uL (ref 0.1–0.9)
Monocytes: 6 %
Neutrophils Absolute: 3.3 10*3/uL (ref 1.4–7.0)
Neutrophils: 44 %
Platelets: 230 10*3/uL (ref 150–450)
RBC: 5.26 x10E6/uL (ref 3.77–5.28)
RDW: 13.7 % (ref 11.7–15.4)
WBC: 7.5 10*3/uL (ref 3.4–10.8)

## 2022-06-22 LAB — COMPREHENSIVE METABOLIC PANEL
ALT: 57 IU/L — ABNORMAL HIGH (ref 0–32)
AST: 35 IU/L (ref 0–40)
Albumin/Globulin Ratio: 2.1 (ref 1.2–2.2)
Albumin: 4.9 g/dL (ref 3.8–4.9)
Alkaline Phosphatase: 78 IU/L (ref 44–121)
BUN/Creatinine Ratio: 17 (ref 9–23)
BUN: 16 mg/dL (ref 6–24)
Bilirubin Total: 0.3 mg/dL (ref 0.0–1.2)
CO2: 24 mmol/L (ref 20–29)
Calcium: 10.9 mg/dL — ABNORMAL HIGH (ref 8.7–10.2)
Chloride: 97 mmol/L (ref 96–106)
Creatinine, Ser: 0.92 mg/dL (ref 0.57–1.00)
Globulin, Total: 2.3 g/dL (ref 1.5–4.5)
Glucose: 135 mg/dL — ABNORMAL HIGH (ref 70–99)
Potassium: 4.6 mmol/L (ref 3.5–5.2)
Sodium: 137 mmol/L (ref 134–144)
Total Protein: 7.2 g/dL (ref 6.0–8.5)
eGFR: 75 mL/min/{1.73_m2} (ref >59)

## 2022-06-22 LAB — CYTOLOGY - PAP
Comment: NEGATIVE
Diagnosis: NEGATIVE
High risk HPV: NEGATIVE

## 2022-06-22 LAB — MICROALBUMIN / CREATININE URINE RATIO
Creatinine, Urine: 38.1 mg/dL
Microalb/Creat Ratio: <8 mg/g creat (ref 0–29)
Microalbumin, Urine: <3 ug/mL

## 2022-06-22 LAB — TSH: TSH: 1.96 u[IU]/mL (ref 0.450–4.500)

## 2022-06-22 MED ORDER — NA SULFATE-K SULFATE-MG SULF 17.5-3.13-1.6 GM/177ML PO SOLN: 1.0000 | Freq: Once | ORAL | 0 refills | Status: AC

## 2022-06-22 NOTE — Telephone Encounter (Signed)
Gastroenterology Pre-Procedure Review  Request Date: 11/28/22 Requesting Physician: Dr. Marius Ditch  PATIENT REVIEW QUESTIONS: The patient responded to the following health history questions as indicated:    1. Are you having any GI issues? no 2. Do you have a personal history of Polyps? yes (last colonoscopy performed by dr. Bonna Gains 11/27/19 polyps were present.  Dr. Bonna Gains recommended a 2 day prep on repeat in 3years ) 3. Do you have a family history of Colon Cancer or Polyps? yes (paternal grandfather stomach and colon cancer, paternal uncle polyps) 4. Diabetes Mellitus? yes (has been advised to stop farxiga and metformin 3 days prior to colonoscopy 08/09) 5. Joint replacements in the past 12 months?no 6. Major health problems in the past 3 months?no 7. Any artificial heart valves, MVP, or defibrillator?no    MEDICATIONS & ALLERGIES:    Patient reports the following regarding taking any anticoagulation/antiplatelet therapy:   Plavix, Coumadin, Eliquis, Xarelto, Lovenox, Pradaxa, Brilinta, or Effient? no Aspirin? no  Patient confirms/reports the following medications:  Current Outpatient Medications  Medication Sig Dispense Refill   albuterol (PROVENTIL HFA;VENTOLIN HFA) 108 (90 BASE) MCG/ACT inhaler Inhale 2 puffs into the lungs every 4 (four) hours as needed for wheezing.      dapagliflozin propanediol (FARXIGA) 10 MG TABS tablet TAKE 1 TABLET(10 MG) BY MOUTH DAILY 90 tablet 3   insulin glargine (LANTUS SOLOSTAR) 100 UNIT/ML Solostar Pen Inject 20 Units into the skin at bedtime. 15 mL 0   lisdexamfetamine (VYVANSE) 30 MG capsule TAKE 1 CAPSULE BY MOUTH DAILY 30 capsule 0   metFORMIN (GLUCOPHAGE) 1000 MG tablet TAKE 1 TABLET(1000 MG) BY MOUTH TWICE DAILY WITH A MEAL 180 tablet 3   montelukast (SINGULAIR) 10 MG tablet Take 1 tablet (10 mg total) by mouth daily. 90 tablet 3   Multiple Vitamin (MULTIVITAMIN) tablet Take 1 tablet by mouth daily.     Omega-3 Fatty Acids (FISH OIL TRIPLE  STRENGTH) 1400 MG CAPS Take by mouth 2 (two) times daily.     rosuvastatin (CRESTOR) 10 MG tablet TAKE 1 TABLET(10 MG) BY MOUTH DAILY 90 tablet 3   sertraline (ZOLOFT) 100 MG tablet Take 150 mg by mouth daily.     traZODone (DESYREL) 100 MG tablet      amLODipine (NORVASC) 10 MG tablet TAKE 1 TABLET(10 MG) BY MOUTH DAILY 90 tablet 3   Continuous Blood Gluc Sensor (DEXCOM G7 SENSOR) MISC Place 1 each onto the skin every 14 (fourteen) days. 2 each 0   glucose blood (TRUE METRIX BLOOD GLUCOSE TEST) test strip Dx E11.9 100 each 2   Insulin Pen Needle (BD PEN NEEDLE NANO 2ND GEN) 32G X 4 MM MISC USE DAILY AT 6AM 100 each 1   losartan (COZAAR) 100 MG tablet Take 1 tablet (100 mg total) by mouth daily. 90 tablet 3   No current facility-administered medications for this visit.    Patient confirms/reports the following allergies:  Allergies  Allergen Reactions   Sulfa Antibiotics Anaphylaxis   Doxycycline     rash   Lisinopril     cough   Cefzil [Cefprozil] Rash    No orders of the defined types were placed in this encounter.   AUTHORIZATION INFORMATION Primary Insurance: 1D#: Group #:  Secondary Insurance: 1D#: Group #:  SCHEDULE INFORMATION: Date: 11/28/22 Time: Location: [facility]

## 2022-06-27 ENCOUNTER — Encounter: Payer: Self-pay | Admitting: Internal Medicine

## 2022-06-28 ENCOUNTER — Other Ambulatory Visit: Payer: Self-pay

## 2022-06-28 DIAGNOSIS — E118 Type 2 diabetes mellitus with unspecified complications: Secondary | ICD-10-CM

## 2022-06-28 MED ORDER — DEXCOM G7 SENSOR MISC: 0 refills | Status: DC

## 2022-07-07 ENCOUNTER — Ambulatory Visit
Admission: RE | Admit: 2022-07-07 | Discharge: 2022-07-07 | Disposition: A | Discharge status: Home / Self Care | Payer: 59 | Source: Ambulatory Visit | Attending: Internal Medicine | Admitting: Internal Medicine

## 2022-07-07 DIAGNOSIS — Z1231 Encounter for screening mammogram for malignant neoplasm of breast: Secondary | ICD-10-CM | POA: Diagnosis not present

## 2022-07-15 ENCOUNTER — Other Ambulatory Visit: Payer: Self-pay

## 2022-07-15 ENCOUNTER — Telehealth: Payer: Self-pay | Admitting: Internal Medicine

## 2022-07-15 DIAGNOSIS — E118 Type 2 diabetes mellitus with unspecified complications: Secondary | ICD-10-CM

## 2022-07-15 NOTE — Telephone Encounter (Signed)
Medication not sent to [facility], Med was sent to [facility] Per Patient.  Ananiah Maciolek

## 2022-07-15 NOTE — Telephone Encounter (Signed)
Copied from [facility] [id number]. Topic: General - Inquiry >> Jul 15, 2022  9:42 AM Penni Bombard wrote: Reason for CRM: Jocelyn Lamer with Dexcom start program with [employer]. Wanting to know if the office received a fax for sample unit for the [place].    CB@  [phone]

## 2022-08-01 ENCOUNTER — Telehealth: Payer: Self-pay

## 2022-08-01 ENCOUNTER — Telehealth: Payer: Self-pay | Admitting: Internal Medicine

## 2022-08-01 NOTE — Telephone Encounter (Signed)
PA completed waiting on insurance approval.  Key: [id number]  KP

## 2022-08-01 NOTE — Telephone Encounter (Signed)
Tried to send notes. Waiting on fax to go thru. Will try to send again tomorrow.  KP

## 2022-08-01 NOTE — Telephone Encounter (Signed)
Copied from CRM [id number]. Topic: General - Inquiry >> Aug 01, 2022  4:10 PM Clide Dales wrote: Samantha Moses with the prior authorizations department called needing patient's records faxed for pa for dapagliflozin propanediol (FARXIGA) 10 MG TABS tablet Fax [phone]

## 2022-08-02 NOTE — Telephone Encounter (Signed)
Fax number is not working. Will try again later.  KP

## 2022-08-02 NOTE — Telephone Encounter (Signed)
Denied  KP 

## 2022-10-25 ENCOUNTER — Ambulatory Visit (INDEPENDENT_AMBULATORY_CARE_PROVIDER_SITE_OTHER): Payer: 59 | Admitting: Internal Medicine

## 2022-10-25 VITALS — BP 124/72 | HR 78 | Ht 63.0 in | Wt 218.0 lb

## 2022-10-25 DIAGNOSIS — E118 Type 2 diabetes mellitus with unspecified complications: Secondary | ICD-10-CM

## 2022-10-25 DIAGNOSIS — J3089 Other allergic rhinitis: Secondary | ICD-10-CM | POA: Diagnosis not present

## 2022-10-25 DIAGNOSIS — Z7984 Long term (current) use of oral hypoglycemic drugs: Secondary | ICD-10-CM | POA: Diagnosis not present

## 2022-10-25 LAB — POCT GLYCOSYLATED HEMOGLOBIN (HGB A1C): Hemoglobin A1C: 8.1 % — AB (ref 4.0–5.6)

## 2022-10-25 MED ORDER — OZEMPIC (0.25 OR 0.5 MG/DOSE) 2 MG/3ML ~~LOC~~ SOPN
0.2500 mg | PEN_INJECTOR | SUBCUTANEOUS | 0 refills | Status: DC
Start: 2022-10-25 — End: 2022-12-11

## 2022-10-25 NOTE — Assessment & Plan Note (Signed)
Has recurrent allergies and intermittent wheezing. Recommend using Albuterol MDI as needed

## 2022-10-25 NOTE — Assessment & Plan Note (Addendum)
Blood sugars stable without hypoglycemic symptoms or events. DEXCOM ordered last visit to aid in diet compliance but it was too expensive. She continues to be stressed with work at [employer] and is overeating. Currently being treated with Farxiga, metformin and Lantus q PM. Lab Results  Component Value Date   HGBA1C 8.1 (H) 06/21/2022  A1C today =8.1 Will add Ozempic.

## 2022-10-25 NOTE — Progress Notes (Signed)
Date:  10/25/2022   Name:  Samantha Moses   DOB:  March 07, 1971   MRN:  [id number]   Chief Complaint: Diabetes  Diabetes She presents for her follow-up diabetic visit. She has type 2 diabetes mellitus. Pertinent negatives for hypoglycemia include no headaches or tremors. Pertinent negatives for diabetes include no chest pain, no fatigue, no polydipsia and no polyuria. Current diabetic treatments: Farxiga, Lantus, metformin.    Lab Results  Component Value Date   NA 137 06/21/2022   K 4.6 06/21/2022   CO2 24 06/21/2022   GLUCOSE 135 (H) 06/21/2022   BUN 16 06/21/2022   CREATININE 0.92 06/21/2022   CALCIUM 10.9 (H) 06/21/2022   EGFR 75 06/21/2022   GFRNONAA >60 01/04/2021   Lab Results  Component Value Date   CHOL 164 06/21/2022   HDL 49 06/21/2022   LDLCALC 78 06/21/2022   LDLDIRECT 151.0 06/11/2018   TRIG 226 (H) 06/21/2022   CHOLHDL 3.3 06/21/2022   Lab Results  Component Value Date   TSH 1.960 06/21/2022   Lab Results  Component Value Date   HGBA1C 8.1 (A) 10/25/2022   Lab Results  Component Value Date   WBC 7.5 06/21/2022   HGB 15.5 06/21/2022   HCT 46.0 06/21/2022   MCV 88 06/21/2022   PLT 230 06/21/2022   Lab Results  Component Value Date   ALT 57 (H) 06/21/2022   AST 35 06/21/2022   ALKPHOS 78 06/21/2022   BILITOT 0.3 06/21/2022   No results found for: "25OHVITD2", "25OHVITD3", "VD25OH"   Review of Systems  Constitutional:  Negative for appetite change, fatigue, fever and unexpected weight change.  HENT:  Negative for tinnitus and trouble swallowing.   Eyes:  Negative for visual disturbance.  Respiratory:  Negative for cough, chest tightness and shortness of breath.   Cardiovascular:  Negative for chest pain, palpitations and leg swelling.  Gastrointestinal:  Negative for abdominal pain.  Endocrine: Negative for polydipsia and polyuria.  Genitourinary:  Negative for dysuria and hematuria.  Musculoskeletal:  Negative for arthralgias.   Neurological:  Negative for tremors, numbness and headaches.  Psychiatric/Behavioral:  Negative for dysphoric mood.     Patient Active Problem List   Diagnosis Date Noted   Close exposure to COVID-19 virus 04/07/2022   Carpal tunnel syndrome of right wrist 01/18/2021   Shoulder tendonitis, right 01/18/2021   OSA on CPAP 07/07/2020   Environmental and seasonal allergies 07/07/2020   Encounter for screening colonoscopy    Polyp of colon    Allergic rhinitis 08/22/2012   Hyperlipidemia associated with type 2 diabetes mellitus (HCC)    Type II diabetes mellitus with complication (HCC)    Essential hypertension    Adjustment reaction with anxiety and depression    ADD (attention deficit disorder)     Allergies  Allergen Reactions   Sulfa Antibiotics Anaphylaxis   Doxycycline     rash   Lisinopril     cough   Cefzil [Cefprozil] Rash    Past Surgical History:  Procedure Laterality Date   COLONOSCOPY WITH PROPOFOL N/A 11/27/2019   Procedure: COLONOSCOPY WITH PROPOFOL;  Surgeon: Pasty Spillers, MD;  Location: [facility] ENDOSCOPY;  Service: Endoscopy;  Laterality: N/A;   none      Social History   Tobacco Use   Smoking status: Never   Smokeless tobacco: Never  Vaping Use   Vaping Use: Never used  Substance Use Topics   Alcohol use: Not Currently    Alcohol/week: 0.0 standard drinks  of alcohol    Comment: rarely   Drug use: No     Medication list has been reviewed and updated.  Current Meds  Medication Sig   albuterol (PROVENTIL HFA;VENTOLIN HFA) 108 (90 BASE) MCG/ACT inhaler Inhale 2 puffs into the lungs every 4 (four) hours as needed for wheezing.    amLODipine (NORVASC) 10 MG tablet TAKE 1 TABLET(10 MG) BY MOUTH DAILY   busPIRone (BUSPAR) 10 MG tablet Take 10 mg by mouth 2 (two) times daily.   dapagliflozin propanediol (FARXIGA) 10 MG TABS tablet TAKE 1 TABLET(10 MG) BY MOUTH DAILY   glucose blood (TRUE METRIX BLOOD GLUCOSE TEST) test strip Dx E11.9   insulin  glargine (LANTUS SOLOSTAR) 100 UNIT/ML Solostar Pen Inject 20 Units into the skin at bedtime.   Insulin Pen Needle (BD PEN NEEDLE NANO 2ND GEN) 32G X 4 MM MISC USE DAILY AT 6AM   lisdexamfetamine (VYVANSE) 30 MG capsule TAKE 1 CAPSULE BY MOUTH DAILY   losartan (COZAAR) 100 MG tablet Take 1 tablet (100 mg total) by mouth daily.   metFORMIN (GLUCOPHAGE) 1000 MG tablet TAKE 1 TABLET(1000 MG) BY MOUTH TWICE DAILY WITH A MEAL   montelukast (SINGULAIR) 10 MG tablet Take 1 tablet (10 mg total) by mouth daily.   Multiple Vitamin (MULTIVITAMIN) tablet Take 1 tablet by mouth daily.   Omega-3 Fatty Acids (FISH OIL TRIPLE STRENGTH) 1400 MG CAPS Take by mouth 2 (two) times daily.   rosuvastatin (CRESTOR) 10 MG tablet TAKE 1 TABLET(10 MG) BY MOUTH DAILY   Semaglutide,0.25 or 0.5MG /DOS, (OZEMPIC, 0.25 OR 0.5 MG/DOSE,) 2 MG/3ML SOPN Inject 0.25 mg into the skin once a week.   Sertraline HCl 200 MG CAPS Take 200 mg by mouth daily.   traZODone (DESYREL) 100 MG tablet        10/25/2022    9:19 AM 06/21/2022   10:53 AM 11/10/2021   11:11 AM 07/09/2021    1:34 PM  GAD 7 : Generalized Anxiety Score  Nervous, Anxious, on Edge 1 0 1 2  Control/stop worrying 1 0 0 0  Worry too much - different things 1 0 1 1  Trouble relaxing 0 0 0 1  Restless 0 0 1 0  Easily annoyed or irritable 1 0 1 2  Afraid - awful might happen 0 0 0 1  Total GAD 7 Score 4 0 4 7  Anxiety Difficulty Not difficult at all Not difficult at all Not difficult at all Somewhat difficult       10/25/2022    9:19 AM 06/21/2022   10:52 AM 11/10/2021   11:11 AM  Depression screen PHQ 2/9  Decreased Interest 0 0 0  Down, Depressed, Hopeless 0 0 0  PHQ - 2 Score 0 0 0  Altered sleeping 1 1 1   Tired, decreased energy 1 1 0  Change in appetite 2 1 1   Feeling bad or failure about yourself  0 0 0  Trouble concentrating 0 0 1  Moving slowly or fidgety/restless 0 0 1  Suicidal thoughts 0 0 0  PHQ-9 Score 4 3 4   Difficult doing work/chores Not  difficult at all Somewhat difficult Somewhat difficult    BP Readings from Last 3 Encounters:  10/25/22 124/72  06/21/22 126/78  04/07/22 112/72    Physical Exam Vitals and nursing note reviewed.  Constitutional:      General: She is not in acute distress.    Appearance: She is well-developed.  HENT:     Head: Normocephalic and  atraumatic.  Cardiovascular:     Rate and Rhythm: Normal rate and regular rhythm.  Pulmonary:     Effort: Pulmonary effort is normal. No respiratory distress.     Breath sounds: Wheezing present.  Skin:    General: Skin is warm and dry.     Findings: No rash.  Neurological:     General: No focal deficit present.     Mental Status: She is alert and oriented to person, place, and time.  Psychiatric:        Mood and Affect: Mood normal.        Behavior: Behavior normal.     Wt Readings from Last 3 Encounters:  10/25/22 218 lb (98.9 kg)  06/21/22 220 lb (99.8 kg)  04/07/22 216 lb (98 kg)    BP 124/72   Pulse 78   Ht 5\' 3"  (1.6 m)   Wt 218 lb (98.9 kg)   LMP 01/17/2020 (Approximate)   SpO2 97%   BMI 38.62 kg/m   Assessment and Plan:  Problem List Items Addressed This Visit     Type II diabetes mellitus with complication (HCC) - Primary (Chronic)    Blood sugars stable without hypoglycemic symptoms or events. DEXCOM ordered last visit to aid in diet compliance but it was too expensive. She continues to be stressed with work at [employer] and is overeating. Currently being treated with Farxiga, metformin and Lantus q PM. Lab Results  Component Value Date   HGBA1C 8.1 (H) 06/21/2022  A1C today =8.1 Will add Ozempic.       Relevant Medications   Semaglutide,0.25 or 0.5MG /DOS, (OZEMPIC, 0.25 OR 0.5 MG/DOSE,) 2 MG/3ML SOPN   Other Relevant Orders   POCT glycosylated hemoglobin (Hb A1C) (Completed)   Environmental and seasonal allergies    Has recurrent allergies and intermittent wheezing. Recommend using Albuterol MDI as needed       Other Visit Diagnoses     Long term current use of oral hypoglycemic drug           Return in about 4 months (around 02/25/2023) for DM.   Partially dictated using Dragon software, any errors are not intentional.  Reubin Milan, MD [facility] [place], [place]

## 2022-11-22 ENCOUNTER — Encounter: Payer: Self-pay | Admitting: Gastroenterology

## 2022-11-28 ENCOUNTER — Other Ambulatory Visit: Payer: Self-pay

## 2022-11-28 ENCOUNTER — Encounter
Admission: RE | Disposition: A | Discharge status: Home / Self Care | Payer: Self-pay | Source: Home / Self Care | Attending: Gastroenterology

## 2022-11-28 ENCOUNTER — Ambulatory Visit: Payer: 59 | Admitting: Certified Registered"

## 2022-11-28 ENCOUNTER — Ambulatory Visit
Admission: RE | Admit: 2022-11-28 | Discharge: 2022-11-28 | Disposition: A | Discharge status: Home / Self Care | Payer: 59 | Attending: Gastroenterology | Admitting: Gastroenterology

## 2022-11-28 ENCOUNTER — Encounter: Payer: Self-pay | Admitting: Gastroenterology

## 2022-11-28 DIAGNOSIS — Z86010 Personal history of colonic polyps: Secondary | ICD-10-CM

## 2022-11-28 DIAGNOSIS — D124 Benign neoplasm of descending colon: Secondary | ICD-10-CM | POA: Insufficient documentation

## 2022-11-28 DIAGNOSIS — Z1211 Encounter for screening for malignant neoplasm of colon: Secondary | ICD-10-CM | POA: Insufficient documentation

## 2022-11-28 DIAGNOSIS — G4730 Sleep apnea, unspecified: Secondary | ICD-10-CM | POA: Diagnosis not present

## 2022-11-28 DIAGNOSIS — K635 Polyp of colon: Secondary | ICD-10-CM | POA: Diagnosis not present

## 2022-11-28 DIAGNOSIS — I10 Essential (primary) hypertension: Secondary | ICD-10-CM | POA: Insufficient documentation

## 2022-11-28 DIAGNOSIS — E119 Type 2 diabetes mellitus without complications: Secondary | ICD-10-CM | POA: Diagnosis not present

## 2022-11-28 HISTORY — PX: POLYPECTOMY: SHX5525

## 2022-11-28 HISTORY — PX: COLONOSCOPY WITH PROPOFOL: SHX5780

## 2022-11-28 LAB — GLUCOSE, CAPILLARY: Glucose-Capillary: 149 mg/dL — ABNORMAL HIGH (ref 70–99)

## 2022-11-28 SURGERY — COLONOSCOPY WITH PROPOFOL
Anesthesia: General

## 2022-11-28 MED ORDER — PROPOFOL 500 MG/50ML IV EMUL
INTRAVENOUS | Status: DC | PRN
  Administered 2022-11-28: 150 ug/kg/min via INTRAVENOUS

## 2022-11-28 MED ORDER — PROPOFOL 10 MG/ML IV BOLUS
INTRAVENOUS | Status: DC | PRN
Start: 2022-11-28 — End: 2022-11-28
  Administered 2022-11-28: 50 mg via INTRAVENOUS

## 2022-11-28 MED ORDER — PROPOFOL 1000 MG/100ML IV EMUL
INTRAVENOUS | Status: AC
  Filled 2022-11-28: qty 100

## 2022-11-28 MED ORDER — SODIUM CHLORIDE 0.9 % IV SOLN: INTRAVENOUS | Status: DC

## 2022-11-28 NOTE — H&P (Signed)
Samantha Repress, MD [address]  [address]  [place], [place] [address]  Main: [phone]  Fax: [phone] Pager: [phone]  Primary Care Physician:  Reubin Milan, MD Primary Gastroenterologist:  Dr. Arlyss Moses  Pre-Procedure History & Physical: HPI:  Samantha Moses is a 52 y.o. female is here for an colonoscopy.   Past Medical History:  Diagnosis Date   ADD (attention deficit disorder)    Depression    Diabetes mellitus without complication (HCC)    Hyperlipidemia    Hypertension     Past Surgical History:  Procedure Laterality Date   COLONOSCOPY WITH PROPOFOL N/A 11/27/2019   Procedure: COLONOSCOPY WITH PROPOFOL;  Surgeon: Pasty Spillers, MD;  Location: [facility] ENDOSCOPY;  Service: Endoscopy;  Laterality: N/A;   none      Prior to Admission medications   Medication Sig Start Date End Date Taking? Authorizing Provider  amLODipine (NORVASC) 10 MG tablet TAKE 1 TABLET(10 MG) BY MOUTH DAILY 06/21/22  Yes Reubin Milan, MD  busPIRone (BUSPAR) 10 MG tablet Take 10 mg by mouth 2 (two) times daily. 08/31/22  Yes [provider]  dextroamphetamine (DEXEDRINE SPANSULE) 10 MG 24 hr capsule Take 10 mg by mouth daily.   Yes [provider]  levocetirizine (XYZAL) 5 MG tablet Take 5 mg by mouth every evening.   Yes [provider]  lisdexamfetamine (VYVANSE) 30 MG capsule TAKE 1 CAPSULE BY MOUTH DAILY 04/13/22  Yes   losartan (COZAAR) 100 MG tablet Take 1 tablet (100 mg total) by mouth daily. 06/21/22  Yes Reubin Milan, MD  montelukast (SINGULAIR) 10 MG tablet Take 1 tablet (10 mg total) by mouth daily. 06/21/22  Yes Reubin Milan, MD  Multiple Vitamin (MULTIVITAMIN) tablet Take 1 tablet by mouth daily.   Yes [provider]  Omega-3 Fatty Acids (FISH OIL TRIPLE STRENGTH) 1400 MG CAPS Take by mouth 2 (two) times daily.   Yes [provider]  rosuvastatin (CRESTOR) 10 MG tablet TAKE 1 TABLET(10 MG) BY MOUTH DAILY  06/21/22  Yes Reubin Milan, MD  Sertraline HCl 200 MG CAPS Take 200 mg by mouth daily. 11/09/21  Yes [provider]  albuterol (PROVENTIL HFA;VENTOLIN HFA) 108 (90 BASE) MCG/ACT inhaler Inhale 2 puffs into the lungs every 4 (four) hours as needed for wheezing.     [provider]  dapagliflozin propanediol (FARXIGA) 10 MG TABS tablet TAKE 1 TABLET(10 MG) BY MOUTH DAILY 06/21/22   Reubin Milan, MD  glucose blood (TRUE METRIX BLOOD GLUCOSE TEST) test strip Dx E11.9 10/25/19   Reubin Milan, MD  insulin glargine (LANTUS SOLOSTAR) 100 UNIT/ML Solostar Pen Inject 20 Units into the skin at bedtime. 06/21/22   Reubin Milan, MD  Insulin Pen Needle (BD PEN NEEDLE NANO 2ND GEN) 32G X 4 MM MISC USE DAILY AT 6AM 03/28/22   Reubin Milan, MD  metFORMIN (GLUCOPHAGE) 1000 MG tablet TAKE 1 TABLET(1000 MG) BY MOUTH TWICE DAILY WITH A MEAL 06/21/22   Reubin Milan, MD  Semaglutide,0.25 or 0.5MG /DOS, (OZEMPIC, 0.25 OR 0.5 MG/DOSE,) 2 MG/3ML SOPN Inject 0.25 mg into the skin once a week. 10/25/22   Reubin Milan, MD  traZODone (DESYREL) 100 MG tablet     [provider]    Allergies as of 06/22/2022 - Review Complete 06/22/2022  Allergen Reaction Noted   Sulfa antibiotics Anaphylaxis 08/22/2012   Doxycycline  11/06/2012   Lisinopril  04/25/2017   Cefzil [cefprozil] Rash  08/22/2012    Family History  Problem Relation Age of Onset   Hyperlipidemia Mother    Hypertension Mother    Hyperlipidemia Father    Hypertension Father    Diabetes Father    Cancer Maternal Aunt 106       breast CA   Breast cancer Paternal Aunt    Breast cancer Cousin    Breast cancer Other    Breast cancer Other     Social History   Socioeconomic History   Marital status: Single    Spouse name: Not on file   Number of children: Not on file   Years of education: Not on file   Highest education level: Bachelor's degree (e.g., BA, AB, BS)  Occupational History   Occupation:  pharmacy tech  Tobacco Use   Smoking status: Never   Smokeless tobacco: Never  Vaping Use   Vaping status: Never Used  Substance and Sexual Activity   Alcohol use: Not Currently    Alcohol/week: 0.0 standard drinks of alcohol    Comment: rarely   Drug use: No   Sexual activity: Not Currently    Partners: Male  Other Topics Concern   Not on file  Social History Narrative   Not on file   Social Determinants of Health   Financial Resource Strain: Low Risk  (10/25/2022)   Overall Financial Resource Strain (CARDIA)    Difficulty of Paying Living Expenses: Not very hard  Food Insecurity: No Food Insecurity (10/25/2022)   Hunger Vital Sign    Worried About Running Out of Food in the Last Year: Never true    Ran Out of Food in the Last Year: Never true  Transportation Needs: No Transportation Needs (10/25/2022)   PRAPARE - Administrator, Civil Service (Medical): No    Lack of Transportation (Non-Medical): No  Physical Activity: Unknown (10/25/2022)   Exercise Vital Sign    Days of Exercise per Week: 0 days    Minutes of Exercise per Session: Not on file  Stress: Stress Concern Present (10/25/2022)   [employer] of Occupational Health - Occupational Stress Questionnaire    Feeling of Stress : To some extent  Social Connections: Socially Isolated (10/25/2022)   Social Connection and Isolation Panel [NHANES]    Frequency of Communication with Friends and Family: Once a week    Frequency of Social Gatherings with Friends and Family: Never    Attends Religious Services: 1 to 4 times per year    Active Member of [employer] or Organizations: No    Attends Engineer, structural: Not on file    Marital Status: Never married  Catering manager Violence: Not on file    Review of Systems: See HPI, otherwise negative ROS  Physical Exam: BP 124/66   Pulse 74   Temp (!) 96.1 F (35.6 C) (Temporal)   Resp 20   Ht 5' 2.5" (1.588 m)   Wt 94.4 kg   LMP 01/17/2020  (Approximate)   SpO2 95%   BMI 37.47 kg/m  General:   Alert,  pleasant and cooperative in NAD Head:  Normocephalic and atraumatic. Neck:  Supple; no masses or thyromegaly. Lungs:  Clear throughout to auscultation.    Heart:  Regular rate and rhythm. Abdomen:  Soft, nontender and nondistended. Normal bowel sounds, without guarding, and without rebound.   Neurologic:  Alert and  oriented x4;  grossly normal neurologically.  Impression/Plan: Samantha Moses is here for an colonoscopy to be performed for h/o  adenomas colon  Risks, benefits, limitations, and alternatives regarding  colonoscopy have been reviewed with the patient.  Questions have been answered.  All parties agreeable.   Lannette Donath, MD  11/28/2022, 7:32 AM

## 2022-11-28 NOTE — Anesthesia Postprocedure Evaluation (Signed)
Anesthesia Post Note  Patient: Samantha Moses  Procedure(s) Performed: COLONOSCOPY WITH PROPOFOL POLYPECTOMY  Patient location during evaluation: Endoscopy Anesthesia Type: General Level of consciousness: awake and alert Pain management: pain level controlled Vital Signs Assessment: post-procedure vital signs reviewed and stable Respiratory status: spontaneous breathing, nonlabored ventilation, respiratory function stable and patient connected to nasal cannula oxygen Cardiovascular status: blood pressure returned to baseline and stable Postop Assessment: no apparent nausea or vomiting Anesthetic complications: no   No notable events documented.   Last Vitals:  Vitals:   11/28/22 0707 11/28/22 0759  BP: 124/66 (!) 109/59  Pulse: 74 62  Resp: 20 16  Temp: (!) 35.6 C (!) 35.7 C  SpO2: 95% 97%    Last Pain:  Vitals:   11/28/22 0819  TempSrc:   PainSc: 0-No pain                 Cleda Mccreedy Ordean Fouts

## 2022-11-28 NOTE — Op Note (Signed)
[facility] Patient Name: Samantha Moses Procedure Date: 11/28/2022 7:20 AM MRN: [id number] Account #: [id number] Date of Birth: 1971/04/06 Admit Type: Outpatient Age: 52 Room: [facility] ENDO ROOM 4 Gender: Female Note Status: Finalized Instrument Name: Prentice Docker [id number] Procedure:             Colonoscopy Indications:           Surveillance: History of adenomatous polyps,                         inadequate prep on last exam (<64yr), Last colonoscopy:                         August 2021 Providers:             Toney Reil MD, MD Referring MD:          Bari Edward, MD (Referring MD) Medicines:             General Anesthesia Complications:         No immediate complications. Estimated blood loss: None. Procedure:             Pre-Anesthesia Assessment:                        - Prior to the procedure, a History and Physical was                         performed, and patient medications and allergies were                         reviewed. The patient is competent. The risks and                         benefits of the procedure and the sedation options and                         risks were discussed with the patient. All questions                         were answered and informed consent was obtained.                         Patient identification and proposed procedure were                         verified by the physician, the nurse, the                         anesthesiologist, the anesthetist and the technician                         in the pre-procedure area in the procedure room in the                         endoscopy suite. Mental Status Examination: alert and                         oriented. Airway Examination: normal oropharyngeal  airway and neck mobility. Respiratory Examination:                         clear to auscultation. CV Examination: normal.                         Prophylactic Antibiotics: The patient  does not require                         prophylactic antibiotics. Prior Anticoagulants: The                         patient has taken no anticoagulant or antiplatelet                         agents. ASA Grade Assessment: III - A patient with                         severe systemic disease. After reviewing the risks and                         benefits, the patient was deemed in satisfactory                         condition to undergo the procedure. The anesthesia                         plan was to use general anesthesia. Immediately prior                         to administration of medications, the patient was                         re-assessed for adequacy to receive sedatives. The                         heart rate, respiratory rate, oxygen saturations,                         blood pressure, adequacy of pulmonary ventilation, and                         response to care were monitored throughout the                         procedure. The physical status of the patient was                         re-assessed after the procedure.                        After obtaining informed consent, the colonoscope was                         passed under direct vision. Throughout the procedure,                         the patient's blood pressure, pulse, and oxygen  saturations were monitored continuously. The                         Colonoscope was introduced through the anus and                         advanced to the the cecum, identified by appendiceal                         orifice and ileocecal valve. The colonoscopy was                         performed without difficulty. The patient tolerated                         the procedure well. The quality of the bowel                         preparation was evaluated using the BBPS [facility] Bowel                         Preparation Scale) with scores of: Right Colon = 3,                         Transverse Colon = 3 and Left  Colon = 3 (entire mucosa                         seen well with no residual staining, small fragments                         of stool or opaque liquid). The total BBPS score                         equals 9. The ileocecal valve, appendiceal orifice,                         and rectum were photographed. Findings:      The perianal and digital rectal examinations were normal. Pertinent       negatives include normal sphincter tone and no palpable rectal lesions.      Two sessile polyps were found in the descending colon. The polyps were 4       mm in size. These polyps were removed with a cold snare. Resection and       retrieval were complete. Estimated blood loss: none.      The retroflexed view of the distal rectum and anal verge was normal and       showed no anal or rectal abnormalities.      The exam was otherwise without abnormality. Impression:            - Two 4 mm polyps in the descending colon, removed                         with a cold snare. Resected and retrieved.                        - The distal rectum and anal verge are normal on  retroflexion view.                        - The examination was otherwise normal. Recommendation:        - Discharge patient to home (with escort).                        - Resume previous diet today.                        - Continue present medications.                        - Await pathology results.                        - Repeat colonoscopy in 5 to 7 years for surveillance                         based on pathology results. Procedure Code(s):     --- Professional ---                        [phone], Colonoscopy, flexible; with removal of                         tumor(s), polyp(s), or other lesion(s) by snare                         technique Diagnosis Code(s):     --- Professional ---                        Z86.010, Personal history of colonic polyps                        D12.4, Benign neoplasm of descending  colon CPT copyright 2022 American Medical Association. All rights reserved. The codes documented in this report are preliminary and upon coder review may  be revised to meet current compliance requirements. Dr. Libby Maw Toney Reil MD, MD 11/28/2022 7:58:47 AM This report has been signed electronically. Number of Addenda: 0 Note Initiated On: 11/28/2022 7:20 AM Scope Withdrawal Time: 0 hours 9 minutes 19 seconds  Total Procedure Duration: 0 hours 13 minutes 43 seconds  Estimated Blood Loss:  Estimated blood loss: none.      [facility]

## 2022-11-28 NOTE — Anesthesia Preprocedure Evaluation (Signed)
Anesthesia Evaluation  Patient identified by MRN, date of birth, ID band Patient awake    Reviewed: Allergy & Precautions, NPO status , Patient's Chart, lab work & pertinent test results  History of Anesthesia Complications Negative for: history of anesthetic complications  Airway Mallampati: III  TM Distance: <3 FB Neck ROM: full    Dental  (+) Chipped   Pulmonary neg shortness of breath, sleep apnea    Pulmonary exam normal        Cardiovascular Exercise Tolerance: Good hypertension, (-) angina Normal cardiovascular exam     Neuro/Psych  PSYCHIATRIC DISORDERS       Neuromuscular disease    GI/Hepatic negative GI ROS, Neg liver ROS,neg GERD  ,,  Endo/Other  diabetes, Type 2    Renal/GU negative Renal ROS  negative genitourinary   Musculoskeletal   Abdominal   Peds  Hematology negative hematology ROS (+)   Anesthesia Other Findings Past Medical History: No date: ADD (attention deficit disorder) No date: Depression No date: Diabetes mellitus without complication (HCC) No date: Hyperlipidemia No date: Hypertension  Past Surgical History: 11/27/2019: COLONOSCOPY WITH PROPOFOL; N/A     Comment:  Procedure: COLONOSCOPY WITH PROPOFOL;  Surgeon:               Pasty Spillers, MD;  Location: [facility] ENDOSCOPY;                Service: Endoscopy;  Laterality: N/A; No date: none  BMI    Body Mass Index: 37.47 kg/m      Reproductive/Obstetrics negative OB ROS                             Anesthesia Physical Anesthesia Plan  ASA: 3  Anesthesia Plan: General   Post-op Pain Management:    Induction: Intravenous  PONV Risk Score and Plan: Propofol infusion and TIVA  Airway Management Planned: Natural Airway and Nasal Cannula  Additional Equipment:   Intra-op Plan:   Post-operative Plan:   Informed Consent: I have reviewed the patients History and Physical, chart, labs and  discussed the procedure including the risks, benefits and alternatives for the proposed anesthesia with the patient or authorized representative who has indicated his/her understanding and acceptance.     Dental Advisory Given  Plan Discussed with: Anesthesiologist, CRNA and Surgeon  Anesthesia Plan Comments: (Patient consented for risks of anesthesia including but not limited to:  - adverse reactions to medications - risk of airway placement if required - damage to eyes, teeth, lips or other oral mucosa - nerve damage due to positioning  - sore throat or hoarseness - Damage to heart, brain, nerves, lungs, other parts of body or loss of life  Patient voiced understanding.)       Anesthesia Quick Evaluation

## 2022-11-28 NOTE — Transfer of Care (Signed)
Immediate Anesthesia Transfer of Care Note  Patient: Samantha Moses  Procedure(s) Performed: COLONOSCOPY WITH PROPOFOL POLYPECTOMY  Patient Location: PACU  Anesthesia Type:General  Level of Consciousness: awake and alert   Airway & Oxygen Therapy: Patient Spontanous Breathing and Patient connected to nasal cannula oxygen  Post-op Assessment: Report given to RN and Post -op Vital signs reviewed and stable  Post vital signs: Reviewed and stable  Last Vitals:  Vitals Value Taken Time  BP    Temp    Pulse    Resp    SpO2      Last Pain:  Vitals:   11/28/22 0707  TempSrc: Temporal  PainSc: 0-No pain         Complications: No notable events documented.

## 2022-11-29 ENCOUNTER — Encounter: Payer: Self-pay | Admitting: Gastroenterology

## 2022-12-07 ENCOUNTER — Encounter: Payer: Self-pay | Admitting: Internal Medicine

## 2022-12-07 ENCOUNTER — Other Ambulatory Visit: Payer: Self-pay

## 2022-12-07 ENCOUNTER — Other Ambulatory Visit: Payer: Self-pay | Admitting: Internal Medicine

## 2022-12-07 DIAGNOSIS — E118 Type 2 diabetes mellitus with unspecified complications: Secondary | ICD-10-CM

## 2022-12-07 MED ORDER — DEXCOM G7 SENSOR MISC
1.0000 | 1 refills | Status: DC
Start: 2022-12-07 — End: 2023-06-19

## 2022-12-11 ENCOUNTER — Encounter: Payer: Self-pay | Admitting: Internal Medicine

## 2022-12-11 ENCOUNTER — Other Ambulatory Visit: Payer: Self-pay | Admitting: Internal Medicine

## 2022-12-11 DIAGNOSIS — E118 Type 2 diabetes mellitus with unspecified complications: Secondary | ICD-10-CM

## 2022-12-11 MED ORDER — OZEMPIC (0.25 OR 0.5 MG/DOSE) 2 MG/3ML ~~LOC~~ SOPN
0.2500 mg | PEN_INJECTOR | SUBCUTANEOUS | 1 refills | Status: DC
Start: 2022-12-11 — End: 2023-02-27

## 2023-02-02 ENCOUNTER — Other Ambulatory Visit: Payer: Self-pay | Admitting: Internal Medicine

## 2023-02-02 DIAGNOSIS — E118 Type 2 diabetes mellitus with unspecified complications: Secondary | ICD-10-CM

## 2023-02-09 LAB — HM DIABETES EYE EXAM

## 2023-02-27 ENCOUNTER — Ambulatory Visit (INDEPENDENT_AMBULATORY_CARE_PROVIDER_SITE_OTHER): Payer: 59 | Admitting: Internal Medicine

## 2023-02-27 VITALS — BP 118/78 | HR 77 | Ht 63.0 in | Wt 213.0 lb

## 2023-02-27 DIAGNOSIS — E118 Type 2 diabetes mellitus with unspecified complications: Secondary | ICD-10-CM

## 2023-02-27 DIAGNOSIS — Z7984 Long term (current) use of oral hypoglycemic drugs: Secondary | ICD-10-CM

## 2023-02-27 LAB — POCT GLYCOSYLATED HEMOGLOBIN (HGB A1C): Hemoglobin A1C: 7.1 % — AB (ref 4.0–5.6)

## 2023-02-27 NOTE — Progress Notes (Signed)
Date:  02/27/2023   Name:  Samantha Moses   DOB:  [birth year]   MRN:  [id number]   Chief Complaint: Diabetes  Diabetes She presents for her follow-up diabetic visit. She has type 2 diabetes mellitus. Pertinent negatives for hypoglycemia include no headaches or tremors. Pertinent negatives for diabetes include no chest pain, no fatigue, no polydipsia and no polyuria. Current diabetic treatments: metformin, Farxiga,Lantus - ozempic added.    Review of Systems  Constitutional:  Negative for appetite change, fatigue, fever and unexpected weight change.  HENT:  Negative for tinnitus and trouble swallowing.   Eyes:  Negative for visual disturbance.  Respiratory:  Negative for cough, chest tightness and shortness of breath.   Cardiovascular:  Negative for chest pain, palpitations and leg swelling.  Gastrointestinal:  Negative for abdominal pain.  Endocrine: Negative for polydipsia and polyuria.  Genitourinary:  Negative for dysuria and hematuria.  Musculoskeletal:  Negative for arthralgias.  Neurological:  Negative for tremors, numbness and headaches.  Psychiatric/Behavioral:  Negative for dysphoric mood.      Lab Results  Component Value Date   NA 137 06/21/2022   K 4.6 06/21/2022   CO2 24 06/21/2022   GLUCOSE 135 (H) 06/21/2022   BUN 16 06/21/2022   CREATININE 0.92 06/21/2022   CALCIUM 10.9 (H) 06/21/2022   EGFR 75 06/21/2022   GFRNONAA >60 01/04/2021   Lab Results  Component Value Date   CHOL 164 06/21/2022   HDL 49 06/21/2022   LDLCALC 78 06/21/2022   LDLDIRECT 151.0 06/11/2018   TRIG 226 (H) 06/21/2022   CHOLHDL 3.3 06/21/2022   Lab Results  Component Value Date   TSH 1.960 06/21/2022   Lab Results  Component Value Date   HGBA1C 7.1 (A) 02/27/2023   Lab Results  Component Value Date   WBC 7.5 06/21/2022   HGB 15.5 06/21/2022   HCT 46.0 06/21/2022   MCV 88 06/21/2022   PLT 230 06/21/2022   Lab Results  Component Value Date   ALT 57 (H) 06/21/2022   AST  35 06/21/2022   ALKPHOS 78 06/21/2022   BILITOT 0.3 06/21/2022   No results found for: "25OHVITD2", "25OHVITD3", "VD25OH"   Patient Active Problem List   Diagnosis Date Noted   History of colonic polyps 11/28/2022   Close exposure to COVID-19 virus 04/07/2022   Carpal tunnel syndrome of right wrist 01/18/2021   Shoulder tendonitis, right 01/18/2021   OSA on CPAP 07/07/2020   Environmental and seasonal allergies 07/07/2020   Encounter for screening colonoscopy    Polyp of colon    Allergic rhinitis 08/22/2012   Hyperlipidemia associated with type 2 diabetes mellitus (HCC)    Type II diabetes mellitus with complication (HCC)    Essential hypertension    Adjustment reaction with anxiety and depression    ADD (attention deficit disorder)     Allergies  Allergen Reactions   Sulfa Antibiotics Anaphylaxis   Doxycycline     rash   Lisinopril     cough   Cefzil [Cefprozil] Rash    Past Surgical History:  Procedure Laterality Date   COLONOSCOPY WITH PROPOFOL N/A 11/27/2019   Procedure: COLONOSCOPY WITH PROPOFOL;  Surgeon: Pasty Spillers, MD;  Location: [facility] ENDOSCOPY;  Service: Endoscopy;  Laterality: N/A;   COLONOSCOPY WITH PROPOFOL N/A 11/28/2022   Procedure: COLONOSCOPY WITH PROPOFOL;  Surgeon: Toney Reil, MD;  Location: [facility] ENDOSCOPY;  Service: Gastroenterology;  Laterality: N/A;   none     POLYPECTOMY  11/28/2022  Procedure: POLYPECTOMY;  Surgeon: Toney Reil, MD;  Location: [facility] ENDOSCOPY;  Service: Gastroenterology;;    Social History   Tobacco Use   Smoking status: Never   Smokeless tobacco: Never  Vaping Use   Vaping status: Never Used  Substance Use Topics   Alcohol use: Not Currently    Alcohol/week: 0.0 standard drinks of alcohol    Comment: rarely   Drug use: No     Medication list has been reviewed and updated.  Current Meds  Medication Sig   albuterol (PROVENTIL HFA;VENTOLIN HFA) 108 (90 BASE) MCG/ACT inhaler Inhale 2 puffs  into the lungs every 4 (four) hours as needed for wheezing.    amLODipine (NORVASC) 10 MG tablet TAKE 1 TABLET(10 MG) BY MOUTH DAILY   busPIRone (BUSPAR) 10 MG tablet Take 10 mg by mouth 3 (three) times daily.   Continuous Glucose Sensor (DEXCOM G7 SENSOR) MISC Inject 1 each into the skin continuous.   dapagliflozin propanediol (FARXIGA) 10 MG TABS tablet TAKE 1 TABLET(10 MG) BY MOUTH DAILY   insulin glargine (LANTUS SOLOSTAR) 100 UNIT/ML Solostar Pen ADMINISTER 20 UNITS UNDER THE SKIN AT BEDTIME   Insulin Pen Needle (BD PEN NEEDLE NANO 2ND GEN) 32G X 4 MM MISC USE DAILY AT 6AM   lisdexamfetamine (VYVANSE) 30 MG capsule TAKE 1 CAPSULE BY MOUTH DAILY   losartan (COZAAR) 100 MG tablet Take 1 tablet (100 mg total) by mouth daily.   metFORMIN (GLUCOPHAGE) 1000 MG tablet TAKE 1 TABLET(1000 MG) BY MOUTH TWICE DAILY WITH A MEAL   montelukast (SINGULAIR) 10 MG tablet Take 1 tablet (10 mg total) by mouth daily.   Multiple Vitamin (MULTIVITAMIN) tablet Take 1 tablet by mouth daily.   Omega-3 Fatty Acids (FISH OIL TRIPLE STRENGTH) 1400 MG CAPS Take by mouth 2 (two) times daily.   rosuvastatin (CRESTOR) 10 MG tablet TAKE 1 TABLET(10 MG) BY MOUTH DAILY   sertraline (ZOLOFT) 100 MG tablet Take 200 mg by mouth daily.   traZODone (DESYREL) 100 MG tablet as needed.       02/27/2023    1:32 PM 10/25/2022    9:19 AM 06/21/2022   10:53 AM 11/10/2021   11:11 AM  GAD 7 : Generalized Anxiety Score  Nervous, Anxious, on Edge 2 1 0 1  Control/stop worrying 1 1 0 0  Worry too much - different things 1 1 0 1  Trouble relaxing 1 0 0 0  Restless 0 0 0 1  Easily annoyed or irritable 1 1 0 1  Afraid - awful might happen 0 0 0 0  Total GAD 7 Score 6 4 0 4  Anxiety Difficulty Somewhat difficult Not difficult at all Not difficult at all Not difficult at all       02/27/2023    1:32 PM 10/25/2022    9:19 AM 06/21/2022   10:52 AM  Depression screen PHQ 2/9  Decreased Interest 1 0 0  Down, Depressed, Hopeless 1 0 0   PHQ - 2 Score 2 0 0  Altered sleeping 2 1 1   Tired, decreased energy 2 1 1   Change in appetite 2 2 1   Feeling bad or failure about yourself  2 0 0  Trouble concentrating 2 0 0  Moving slowly or fidgety/restless 1 0 0  Suicidal thoughts 0 0 0  PHQ-9 Score 13 4 3   Difficult doing work/chores Somewhat difficult Not difficult at all Somewhat difficult    BP Readings from Last 3 Encounters:  02/27/23 118/78  11/28/22 Marland Kitchen)  109/59  10/25/22 124/72    Physical Exam Vitals and nursing note reviewed.  Constitutional:      General: She is not in acute distress.    Appearance: She is well-developed.  HENT:     Head: Normocephalic and atraumatic.  Cardiovascular:     Rate and Rhythm: Normal rate and regular rhythm.     Heart sounds: No murmur heard. Pulmonary:     Effort: Pulmonary effort is normal. No respiratory distress.     Breath sounds: No wheezing or rhonchi.  Musculoskeletal:     Cervical back: Normal range of motion.     Right lower leg: No edema.     Left lower leg: No edema.  Lymphadenopathy:     Cervical: No cervical adenopathy.  Skin:    General: Skin is warm and dry.     Findings: No rash.  Neurological:     General: No focal deficit present.     Mental Status: She is alert and oriented to person, place, and time.  Psychiatric:        Mood and Affect: Mood normal.        Behavior: Behavior normal.     Wt Readings from Last 3 Encounters:  02/27/23 213 lb (96.6 kg)  11/28/22 208 lb 3.2 oz (94.4 kg)  10/25/22 218 lb (98.9 kg)    BP 118/78   Pulse 77   Ht 5\' 3"  (1.6 m)   Wt 213 lb (96.6 kg)   LMP 01/17/2020 (Approximate)   SpO2 95%   BMI 37.73 kg/m   Assessment and Plan:  Problem List Items Addressed This Visit       Unprioritized   Type II diabetes mellitus with complication (HCC) - Primary (Chronic)    Blood sugars stable without hypoglycemic symptoms or events. Currently managed with metformin, Farxiga, Lantus. Changes made last visit are  adding Ozempic but stopped due to nausea and vomiting. Lab Results  Component Value Date   HGBA1C 8.1 (A) 10/25/2022  A1C today = 7.1. She plans to work on diet changes and follow up with the weight loss clinic in [place]. Can consider a trial of Victoza if needed in the future       Relevant Orders   POCT glycosylated hemoglobin (Hb A1C) (Completed)   Other Visit Diagnoses     Long term current use of oral hypoglycemic drug           Return for CPX.    Reubin Milan, MD [facility] [place]

## 2023-02-27 NOTE — Assessment & Plan Note (Addendum)
Blood sugars stable without hypoglycemic symptoms or events. Currently managed with metformin, Farxiga, Lantus. Changes made last visit are adding Ozempic but stopped due to nausea and vomiting. Lab Results  Component Value Date   HGBA1C 8.1 (A) 10/25/2022  A1C today = 7.1. She plans to work on diet changes and follow up with the weight loss clinic in [place]. Can consider a trial of Victoza if needed in the future

## 2023-03-15 ENCOUNTER — Encounter: Payer: Self-pay | Admitting: Internal Medicine

## 2023-03-15 NOTE — Telephone Encounter (Signed)
Care team updated and letter sent for eye exam notes.

## 2023-06-18 ENCOUNTER — Other Ambulatory Visit: Payer: Self-pay | Admitting: Internal Medicine

## 2023-06-18 DIAGNOSIS — E118 Type 2 diabetes mellitus with unspecified complications: Secondary | ICD-10-CM

## 2023-06-19 NOTE — Telephone Encounter (Signed)
 Requested Prescriptions  Pending Prescriptions Disp Refills   Continuous Glucose Sensor (DEXCOM G7 SENSOR) MISC [Pharmacy Med Name: DEXCOM G7 SENSOR] 9 each 1    Sig: USE AS DIRECTED CHANGE EVERY 10 DAYS     Endocrinology: Diabetes - Testing Supplies Passed - 06/19/2023  4:05 PM      Passed - Valid encounter within last 12 months    Recent Outpatient Visits           3 months ago Type II diabetes mellitus with complication [facility])   [place] [facility] at [facility], Nyoka Cowden, MD   7 months ago Type II diabetes mellitus with complication [facility])   [place] [facility] at [facility], Nyoka Cowden, MD   12 months ago Annual physical exam   [facility] at [facility], Nyoka Cowden, MD   1 year ago Type II diabetes mellitus with complication [facility])   [place] [facility] at [facility], Nyoka Cowden, MD   1 year ago Essential hypertension   [place] [facility] at [facility], Nyoka Cowden, MD       Future Appointments             In 4 weeks Judithann Graves Nyoka Cowden, MD [facility] at [facility], [facility]

## 2023-06-27 ENCOUNTER — Other Ambulatory Visit: Payer: Self-pay | Admitting: Internal Medicine

## 2023-06-28 NOTE — Telephone Encounter (Signed)
 Requested Prescriptions  Pending Prescriptions Disp Refills   montelukast (SINGULAIR) 10 MG tablet [Pharmacy Med Name: MONTELUKAST 10MG  TABLETS] 90 tablet 0    Sig: TAKE 1 TABLET(10 MG) BY MOUTH DAILY     Pulmonology:  Leukotriene Inhibitors Passed - 06/28/2023  8:29 AM      Passed - Valid encounter within last 12 months    Recent Outpatient Visits           4 months ago Type II diabetes mellitus with complication [facility])   [place] [facility] at [facility], Nyoka Cowden, MD   8 months ago Type II diabetes mellitus with complication [facility])   [place] [facility] at [facility], Nyoka Cowden, MD   1 year ago Annual physical exam   [facility] at [facility], Nyoka Cowden, MD   1 year ago Type II diabetes mellitus with complication [facility])   [place] [facility] at [facility], Nyoka Cowden, MD   1 year ago Essential hypertension   [place] [facility] Medicine at [facility], Nyoka Cowden, MD       Future Appointments             In 2 weeks Judithann Graves, Nyoka Cowden, MD [facility] Medicine at [facility], [facility]

## 2023-07-17 ENCOUNTER — Ambulatory Visit (INDEPENDENT_AMBULATORY_CARE_PROVIDER_SITE_OTHER): Payer: 59 | Admitting: Internal Medicine

## 2023-07-17 VITALS — BP 122/78 | HR 85 | Ht 63.0 in | Wt 219.1 lb

## 2023-07-17 DIAGNOSIS — E1169 Type 2 diabetes mellitus with other specified complication: Secondary | ICD-10-CM | POA: Diagnosis not present

## 2023-07-17 DIAGNOSIS — I10 Essential (primary) hypertension: Secondary | ICD-10-CM

## 2023-07-17 DIAGNOSIS — E785 Hyperlipidemia, unspecified: Secondary | ICD-10-CM

## 2023-07-17 DIAGNOSIS — Z1231 Encounter for screening mammogram for malignant neoplasm of breast: Secondary | ICD-10-CM

## 2023-07-17 DIAGNOSIS — Z0000 Encounter for general adult medical examination without abnormal findings: Secondary | ICD-10-CM

## 2023-07-17 DIAGNOSIS — F4323 Adjustment disorder with mixed anxiety and depressed mood: Secondary | ICD-10-CM

## 2023-07-17 DIAGNOSIS — E118 Type 2 diabetes mellitus with unspecified complications: Secondary | ICD-10-CM

## 2023-07-17 DIAGNOSIS — M25552 Pain in left hip: Secondary | ICD-10-CM | POA: Diagnosis not present

## 2023-07-17 DIAGNOSIS — Z794 Long term (current) use of insulin: Secondary | ICD-10-CM | POA: Diagnosis not present

## 2023-07-17 NOTE — Patient Instructions (Signed)
 Call [facility] to schedule your mammogram at [phone].

## 2023-07-17 NOTE — Assessment & Plan Note (Signed)
 Followed by Psych - on Sertraline, Buspar and Trazodone.

## 2023-07-17 NOTE — Assessment & Plan Note (Signed)
 Blood sugars have been stable.  No recent hypoglycemic events requiring assistance. Currently medications are MTF, Lantus and [place]. Lab Results  Component Value Date   HGBA1C 7.1 (A) 02/27/2023   Last visit no changes were made.

## 2023-07-17 NOTE — Assessment & Plan Note (Signed)
 LDL is  Lab Results  Component Value Date   LDLCALC 78 06/21/2022   Current regimen is Crestor.  No medication side effects noted. Goal LDL is <70.

## 2023-07-17 NOTE — Assessment & Plan Note (Signed)
 Blood pressure is well controlled.  Current medications losartan and amlodipine  Will continue same regimen along with efforts to limit dietary sodium.

## 2023-07-17 NOTE — Progress Notes (Signed)
 Date:  07/17/2023   Name:  Samantha Moses   DOB:  February 24, 1971   MRN:  [id number]   Chief Complaint: Annual Exam Samantha Moses is a 53 y.o. female who presents today for her Complete Annual Exam. She feels well. She reports exercising none. She reports she is sleeping poorly. Breast complaints none.  Health Maintenance  Topic Date Due   DTaP/Tdap/Td vaccine (2 - Td or Tdap) 11/02/2022   Yearly kidney function blood test for diabetes  06/21/2023   Yearly kidney health urinalysis for diabetes  06/21/2023   Mammogram  07/07/2023   Flu Shot  07/17/2023*   COVID-19 Vaccine (5 - 2024-25 season) 08/02/2023*   Hemoglobin A1C  08/27/2023   Eye exam for diabetics  02/09/2024   Complete foot exam   07/16/2024   Pap with HPV screening  06/21/2027   Colon Cancer Screening  11/28/2027   Pneumococcal Vaccination  Completed   Hepatitis C Screening  Completed   Zoster (Shingles) Vaccine  Completed   HPV Vaccine  Aged Out   HIV Screening  Discontinued  *Topic was postponed. The date shown is not the original due date.     Hypertension This is a chronic problem. The problem is controlled. Pertinent negatives include no chest pain, headaches, palpitations or shortness of breath. Past treatments include angiotensin blockers.  Diabetes She presents for her follow-up diabetic visit. She has type 2 diabetes mellitus. Her disease course has been stable. Pertinent negatives for hypoglycemia include no dizziness, headaches, nervousness/anxiousness or tremors. Pertinent negatives for diabetes include no chest pain, no fatigue, no polydipsia and no polyuria.  Hyperlipidemia This is a chronic problem. The problem is controlled. Pertinent negatives include no chest pain or shortness of breath.    Review of Systems  Constitutional:  Negative for chills, fatigue and fever.  HENT:  Negative for congestion, hearing loss, tinnitus, trouble swallowing and voice change.   Eyes:  Negative for visual disturbance.   Respiratory:  Negative for cough, chest tightness, shortness of breath and wheezing.   Cardiovascular:  Negative for chest pain, palpitations and leg swelling.  Gastrointestinal:  Negative for abdominal pain, constipation, diarrhea and vomiting.  Endocrine: Negative for polydipsia and polyuria.  Genitourinary:  Negative for dysuria, frequency, genital sores, vaginal bleeding and vaginal discharge.  Musculoskeletal:  Positive for arthralgias (left hip pain since a fall 2 months ago). Negative for gait problem and joint swelling.  Skin:  Negative for color change and rash.  Neurological:  Negative for dizziness, tremors, light-headedness and headaches.  Hematological:  Negative for adenopathy. Does not bruise/bleed easily.  Psychiatric/Behavioral:  Negative for dysphoric mood and sleep disturbance. The patient is not nervous/anxious.      Lab Results  Component Value Date   NA 137 06/21/2022   K 4.6 06/21/2022   CO2 24 06/21/2022   GLUCOSE 135 (H) 06/21/2022   BUN 16 06/21/2022   CREATININE 0.92 06/21/2022   CALCIUM 10.9 (H) 06/21/2022   EGFR 75 06/21/2022   GFRNONAA >60 01/04/2021   Lab Results  Component Value Date   CHOL 164 06/21/2022   HDL 49 06/21/2022   LDLCALC 78 06/21/2022   LDLDIRECT 151.0 06/11/2018   TRIG 226 (H) 06/21/2022   CHOLHDL 3.3 06/21/2022   Lab Results  Component Value Date   TSH 1.960 06/21/2022   Lab Results  Component Value Date   HGBA1C 7.1 (A) 02/27/2023   Lab Results  Component Value Date   WBC 7.5 06/21/2022  HGB 15.5 06/21/2022   HCT 46.0 06/21/2022   MCV 88 06/21/2022   PLT 230 06/21/2022   Lab Results  Component Value Date   ALT 57 (H) 06/21/2022   AST 35 06/21/2022   ALKPHOS 78 06/21/2022   BILITOT 0.3 06/21/2022   No results found for: "25OHVITD2", "25OHVITD3", "VD25OH"   Patient Active Problem List   Diagnosis Date Noted   History of colonic polyps 11/28/2022   Close exposure to COVID-19 virus 04/07/2022   Carpal tunnel  syndrome of right wrist 01/18/2021   Shoulder tendonitis, right 01/18/2021   OSA on CPAP 07/07/2020   Environmental and seasonal allergies 07/07/2020   Encounter for screening colonoscopy    Polyp of colon    Allergic rhinitis 08/22/2012   Hyperlipidemia associated with type 2 diabetes mellitus (HCC)    Type II diabetes mellitus with complication (HCC)    Essential hypertension    Adjustment reaction with anxiety and depression    ADD (attention deficit disorder)     Allergies  Allergen Reactions   Sulfa Antibiotics Anaphylaxis   Doxycycline     rash   Lisinopril     cough   Cefzil [Cefprozil] Rash    Past Surgical History:  Procedure Laterality Date   COLONOSCOPY WITH PROPOFOL N/A 11/27/2019   Procedure: COLONOSCOPY WITH PROPOFOL;  Surgeon: Pasty Spillers, MD;  Location: [facility] ENDOSCOPY;  Service: Endoscopy;  Laterality: N/A;   COLONOSCOPY WITH PROPOFOL N/A 11/28/2022   Procedure: COLONOSCOPY WITH PROPOFOL;  Surgeon: Toney Reil, MD;  Location: [facility] ENDOSCOPY;  Service: Gastroenterology;  Laterality: N/A;   none     POLYPECTOMY  11/28/2022   Procedure: POLYPECTOMY;  Surgeon: Toney Reil, MD;  Location: [facility] ENDOSCOPY;  Service: Gastroenterology;;    Social History   Tobacco Use   Smoking status: Never   Smokeless tobacco: Never  Vaping Use   Vaping status: Never Used  Substance Use Topics   Alcohol use: Not Currently    Alcohol/week: 0.0 standard drinks of alcohol    Comment: rarely   Drug use: No     Medication list has been reviewed and updated.  Current Meds  Medication Sig   albuterol (PROVENTIL HFA;VENTOLIN HFA) 108 (90 BASE) MCG/ACT inhaler Inhale 2 puffs into the lungs every 4 (four) hours as needed for wheezing.    amLODipine (NORVASC) 10 MG tablet TAKE 1 TABLET(10 MG) BY MOUTH DAILY   busPIRone (BUSPAR) 10 MG tablet Take 10 mg by mouth 3 (three) times daily.   Continuous Glucose Sensor (DEXCOM G7 SENSOR) MISC USE AS DIRECTED CHANGE  EVERY 10 DAYS   dapagliflozin propanediol (FARXIGA) 10 MG TABS tablet TAKE 1 TABLET(10 MG) BY MOUTH DAILY   insulin glargine (LANTUS SOLOSTAR) 100 UNIT/ML Solostar Pen ADMINISTER 20 UNITS UNDER THE SKIN AT BEDTIME   Insulin Pen Needle (BD PEN NEEDLE NANO 2ND GEN) 32G X 4 MM MISC USE DAILY AT 6AM   lisdexamfetamine (VYVANSE) 30 MG capsule TAKE 1 CAPSULE BY MOUTH DAILY   losartan (COZAAR) 100 MG tablet Take 1 tablet (100 mg total) by mouth daily.   metFORMIN (GLUCOPHAGE) 1000 MG tablet TAKE 1 TABLET(1000 MG) BY MOUTH TWICE DAILY WITH A MEAL   montelukast (SINGULAIR) 10 MG tablet TAKE 1 TABLET(10 MG) BY MOUTH DAILY   Multiple Vitamin (MULTIVITAMIN) tablet Take 1 tablet by mouth daily.   Omega-3 Fatty Acids (FISH OIL TRIPLE STRENGTH) 1400 MG CAPS Take by mouth 2 (two) times daily.   rosuvastatin (CRESTOR) 10 MG tablet TAKE  1 TABLET(10 MG) BY MOUTH DAILY   sertraline (ZOLOFT) 100 MG tablet Take 200 mg by mouth daily.   traZODone (DESYREL) 100 MG tablet as needed.       07/17/2023    9:42 AM 02/27/2023    1:32 PM 10/25/2022    9:19 AM 06/21/2022   10:53 AM  GAD 7 : Generalized Anxiety Score  Nervous, Anxious, on Edge 0 2 1 0  Control/stop worrying 0 1 1 0  Worry too much - different things 0 1 1 0  Trouble relaxing 0 1 0 0  Restless 0 0 0 0  Easily annoyed or irritable 1 1 1  0  Afraid - awful might happen 0 0 0 0  Total GAD 7 Score 1 6 4  0  Anxiety Difficulty  Somewhat difficult Not difficult at all Not difficult at all       07/17/2023    9:41 AM 02/27/2023    1:32 PM 10/25/2022    9:19 AM  Depression screen PHQ 2/9  Decreased Interest 0 1 0  Down, Depressed, Hopeless 0 1 0  PHQ - 2 Score 0 2 0  Altered sleeping 2 2 1   Tired, decreased energy 2 2 1   Change in appetite 1 2 2   Feeling bad or failure about yourself  0 2 0  Trouble concentrating 0 2 0  Moving slowly or fidgety/restless 0 1 0  Suicidal thoughts 0 0 0  PHQ-9 Score 5 13 4   Difficult doing work/chores Not difficult at  all Somewhat difficult Not difficult at all    BP Readings from Last 3 Encounters:  07/17/23 122/78  02/27/23 118/78  11/28/22 (!) 109/59    Physical Exam Vitals and nursing note reviewed.  Constitutional:      General: She is not in acute distress.    Appearance: She is well-developed.  HENT:     Head: Normocephalic and atraumatic.     Right Ear: Tympanic membrane and ear canal normal.     Left Ear: Tympanic membrane and ear canal normal.     Nose:     Right Sinus: No maxillary sinus tenderness.     Left Sinus: No maxillary sinus tenderness.  Eyes:     General: No scleral icterus.       Right eye: No discharge.        Left eye: No discharge.     Conjunctiva/sclera: Conjunctivae normal.  Neck:     Thyroid: No thyromegaly.     Vascular: No carotid bruit.  Cardiovascular:     Rate and Rhythm: Normal rate and regular rhythm.     Pulses: Normal pulses.     Heart sounds: Normal heart sounds.  Pulmonary:     Effort: Pulmonary effort is normal. No respiratory distress.     Breath sounds: No wheezing.  Abdominal:     General: Bowel sounds are normal.     Palpations: Abdomen is soft.     Tenderness: There is no abdominal tenderness.  Musculoskeletal:     Cervical back: Normal range of motion. No erythema.     Left hip: Bony tenderness present. No deformity or crepitus. Normal range of motion.     Right lower leg: No edema.     Left lower leg: No edema.  Lymphadenopathy:     Cervical: No cervical adenopathy.  Skin:    General: Skin is warm and dry.     Findings: No rash.  Neurological:     Mental Status: She is  alert and oriented to person, place, and time.     Cranial Nerves: No cranial nerve deficit.     Sensory: No sensory deficit.     Deep Tendon Reflexes: Reflexes are normal and symmetric.  Psychiatric:        Attention and Perception: Attention normal.        Mood and Affect: Mood normal.    Diabetic Foot Exam - Simple   Simple Foot Form Diabetic Foot exam  was performed with the following findings: Yes 07/17/2023  9:54 AM  Visual Inspection No deformities, no ulcerations, no other skin breakdown bilaterally: Yes Sensation Testing Intact to touch and monofilament testing bilaterally: Yes Pulse Check Posterior Tibialis and Dorsalis pulse intact bilaterally: Yes Comments      Wt Readings from Last 3 Encounters:  07/17/23 219 lb 2 oz (99.4 kg)  02/27/23 213 lb (96.6 kg)  11/28/22 208 lb 3.2 oz (94.4 kg)    BP 122/78   Pulse 85   Ht 5\' 3"  (1.6 m)   Wt 219 lb 2 oz (99.4 kg)   LMP 01/17/2020 (Approximate)   SpO2 96%   BMI 38.82 kg/m   Assessment and Plan:  Problem List Items Addressed This Visit       Unprioritized   Hyperlipidemia associated with type 2 diabetes mellitus (HCC) (Chronic)   LDL is  Lab Results  Component Value Date   LDLCALC 78 06/21/2022   Current regimen is Crestor.  No medication side effects noted. Goal LDL is <70.       Relevant Orders   Lipid panel   Type II diabetes mellitus with complication (HCC) (Chronic)   Blood sugars have been stable.  No recent hypoglycemic events requiring assistance. Currently medications are MTF, Lantus and [place]. Lab Results  Component Value Date   HGBA1C 7.1 (A) 02/27/2023   Last visit no changes were made.       Relevant Orders   Comprehensive metabolic panel with GFR   Hemoglobin A1c   Microalbumin / creatinine urine ratio   Essential hypertension (Chronic)   Blood pressure is well controlled.  Current medications losartan and amlodipine  Will continue same regimen along with efforts to limit dietary sodium.       Relevant Orders   CBC with Differential/Platelet   Comprehensive metabolic panel with GFR   TSH   Urinalysis, Routine w reflex microscopic   Adjustment reaction with anxiety and depression (Chronic)   Followed by Psych - on Sertraline, Buspar and Trazodone.      Relevant Orders   TSH   Other Visit Diagnoses       Annual physical  exam    -  Primary   continue to work on diet changes schedule mammogram other screenings and immunizations are up to date   Relevant Orders   CBC with Differential/Platelet   Comprehensive metabolic panel with GFR   Hemoglobin A1c   Lipid panel   TSH   Urinalysis, Routine w reflex microscopic   Microalbumin / creatinine urine ratio     Encounter for screening mammogram for breast cancer       Relevant Orders   MM 3D SCREENING MAMMOGRAM BILATERAL BREAST     Left hip pain       continue Advil as needed consider SM referral if no resolution     Long-term insulin use (HCC)           Return in about 4 months (around 11/16/2023) for DM.    Vernona Rieger  Haynes Kerns, MD [facility] [place]

## 2023-07-18 LAB — CBC WITH DIFFERENTIAL/PLATELET
Basophils Absolute: 0.1 10*3/uL (ref 0.0–0.2)
Basos: 1 %
EOS (ABSOLUTE): 1.6 10*3/uL — ABNORMAL HIGH (ref 0.0–0.4)
Eos: 17 %
Hematocrit: 46.4 % (ref 34.0–46.6)
Hemoglobin: 15.2 g/dL (ref 11.1–15.9)
Immature Grans (Abs): 0 10*3/uL (ref 0.0–0.1)
Immature Granulocytes: 0 %
Lymphocytes Absolute: 2.3 10*3/uL (ref 0.7–3.1)
Lymphs: 25 %
MCH: 28.6 pg (ref 26.6–33.0)
MCHC: 32.8 g/dL (ref 31.5–35.7)
MCV: 87 fL (ref 79–97)
Monocytes Absolute: 0.6 10*3/uL (ref 0.1–0.9)
Monocytes: 6 %
Neutrophils Absolute: 4.7 10*3/uL (ref 1.4–7.0)
Neutrophils: 51 %
Platelets: 205 10*3/uL (ref 150–450)
RBC: 5.32 x10E6/uL — ABNORMAL HIGH (ref 3.77–5.28)
RDW: 13.7 % (ref 11.7–15.4)
WBC: 9.3 10*3/uL (ref 3.4–10.8)

## 2023-07-18 LAB — URINALYSIS, ROUTINE W REFLEX MICROSCOPIC
Bilirubin, UA: NEGATIVE
Ketones, UA: NEGATIVE
Leukocytes,UA: NEGATIVE
Nitrite, UA: NEGATIVE
Protein,UA: NEGATIVE
RBC, UA: NEGATIVE
Specific Gravity, UA: >=1.03 — AB (ref 1.005–1.030)
Urobilinogen, Ur: 0.2 mg/dL (ref 0.2–1.0)
pH, UA: 5.5 (ref 5.0–7.5)

## 2023-07-18 LAB — LIPID PANEL
Chol/HDL Ratio: 3 ratio (ref 0.0–4.4)
Cholesterol, Total: 178 mg/dL (ref 100–199)
HDL: 59 mg/dL (ref >39)
LDL Chol Calc (NIH): 90 mg/dL (ref 0–99)
Triglycerides: 171 mg/dL — ABNORMAL HIGH (ref 0–149)
VLDL Cholesterol Cal: 29 mg/dL (ref 5–40)

## 2023-07-18 LAB — MICROALBUMIN / CREATININE URINE RATIO
Creatinine, Urine: 63.5 mg/dL
Microalb/Creat Ratio: <5 mg/g{creat} (ref 0–29)
Microalbumin, Urine: <3 ug/mL

## 2023-07-18 LAB — COMPREHENSIVE METABOLIC PANEL WITH GFR
ALT: 51 IU/L — ABNORMAL HIGH (ref 0–32)
AST: 29 IU/L (ref 0–40)
Albumin: 4.4 g/dL (ref 3.8–4.9)
Alkaline Phosphatase: 82 IU/L (ref 44–121)
BUN/Creatinine Ratio: 28 — ABNORMAL HIGH (ref 9–23)
BUN: 26 mg/dL — ABNORMAL HIGH (ref 6–24)
Bilirubin Total: 0.3 mg/dL (ref 0.0–1.2)
CO2: 22 mmol/L (ref 20–29)
Calcium: 9.5 mg/dL (ref 8.7–10.2)
Chloride: 100 mmol/L (ref 96–106)
Creatinine, Ser: 0.92 mg/dL (ref 0.57–1.00)
Globulin, Total: 2.1 g/dL (ref 1.5–4.5)
Glucose: 124 mg/dL — ABNORMAL HIGH (ref 70–99)
Potassium: 4.9 mmol/L (ref 3.5–5.2)
Sodium: 140 mmol/L (ref 134–144)
Total Protein: 6.5 g/dL (ref 6.0–8.5)
eGFR: 74 mL/min/{1.73_m2} (ref >59)

## 2023-07-18 LAB — TSH: TSH: 2.56 u[IU]/mL (ref 0.450–4.500)

## 2023-07-18 LAB — HEMOGLOBIN A1C
Est. average glucose Bld gHb Est-mCnc: 177 mg/dL
Hgb A1c MFr Bld: 7.8 % — ABNORMAL HIGH (ref 4.8–5.6)

## 2023-07-19 ENCOUNTER — Encounter: Payer: Self-pay | Admitting: Internal Medicine

## 2023-07-31 ENCOUNTER — Other Ambulatory Visit: Payer: Self-pay | Admitting: Internal Medicine

## 2023-07-31 DIAGNOSIS — E118 Type 2 diabetes mellitus with unspecified complications: Secondary | ICD-10-CM

## 2023-08-01 NOTE — Telephone Encounter (Signed)
 Requested Prescriptions  Pending Prescriptions Disp Refills   dapagliflozin propanediol (FARXIGA) 10 MG TABS tablet [Pharmacy Med Name: FARXIGA 10MG  TABLETS] 90 tablet 1    Sig: TAKE 1 TABLET(10 MG) BY MOUTH DAILY     Endocrinology:  Diabetes - SGLT2 Inhibitors Passed - 08/01/2023 10:32 AM      Passed - Cr in normal range and within 360 days    Creatinine, Ser  Date Value Ref Range Status  07/17/2023 0.92 0.57 - 1.00 mg/dL Final   Creatinine,U  Date Value Ref Range Status  06/11/2018 68.1 mg/dL Final         Passed - HBA1C is between 0 and 7.9 and within 180 days    HbA1c POC (<> result, manual entry)  Date Value Ref Range Status  06/11/2018 9.5 4.0 - 5.6 % Final   Hgb A1c MFr Bld  Date Value Ref Range Status  07/17/2023 7.8 (H) 4.8 - 5.6 % Final    Comment:             Prediabetes: 5.7 - 6.4          Diabetes: >6.4          Glycemic control for adults with diabetes: <7.0          Passed - eGFR in normal range and within 360 days    GFR calc Af Amer  Date Value Ref Range Status  03/04/2020 83 >59 mL/min/1.73 Final    Comment:    **In accordance with recommendations from the NKF-ASN Task force,**   [facility] is in the process of updating its eGFR calculation to the   2021 CKD-EPI creatinine equation that estimates kidney function   without a race variable.    GFR, Estimated  Date Value Ref Range Status  01/04/2021 >60 >60 mL/min Final    Comment:    (NOTE) Calculated using the CKD-EPI Creatinine Equation (2021)    GFR  Date Value Ref Range Status  06/11/2018 60.41 >60.00 mL/min Final   eGFR  Date Value Ref Range Status  07/17/2023 74 >59 mL/min/1.73 Final         Passed - Valid encounter within last 6 months    Recent Outpatient Visits           2 weeks ago Annual physical exam   [facility] at [facility], [place], MD

## 2023-08-09 ENCOUNTER — Other Ambulatory Visit: Payer: Self-pay | Admitting: Internal Medicine

## 2023-08-09 DIAGNOSIS — E1169 Type 2 diabetes mellitus with other specified complication: Secondary | ICD-10-CM

## 2023-08-09 DIAGNOSIS — E785 Hyperlipidemia, unspecified: Secondary | ICD-10-CM

## 2023-08-10 NOTE — Telephone Encounter (Signed)
 Requested Prescriptions  Pending Prescriptions Disp Refills   rosuvastatin  (CRESTOR ) 10 MG tablet [Pharmacy Med Name: ROSUVASTATIN  10MG  TABLETS] 90 tablet 0    Sig: TAKE 1 TABLET(10 MG) BY MOUTH DAILY     Cardiovascular:  Antilipid - Statins 2 Failed - 08/10/2023 11:50 AM      Failed - Lipid Panel in normal range within the last 12 months    Cholesterol, Total  Date Value Ref Range Status  07/17/2023 178 100 - 199 mg/dL Final   LDL Chol Calc (NIH)  Date Value Ref Range Status  07/17/2023 90 0 - 99 mg/dL Final   Direct LDL  Date Value Ref Range Status  06/11/2018 151.0 mg/dL Final    Comment:    Optimal:  <100 mg/dLNear or Above Optimal:  100-129 mg/dLBorderline High:  130-159 mg/dLHigh:  160-189 mg/dLVery High:  >190 mg/dL   HDL  Date Value Ref Range Status  07/17/2023 59 >39 mg/dL Final   Triglycerides  Date Value Ref Range Status  07/17/2023 171 (H) 0 - 149 mg/dL Final         Passed - Cr in normal range and within 360 days    Creatinine, Ser  Date Value Ref Range Status  07/17/2023 0.92 0.57 - 1.00 mg/dL Final   Creatinine,U  Date Value Ref Range Status  06/11/2018 68.1 mg/dL Final         Passed - Patient is not pregnant      Passed - Valid encounter within last 12 months    Recent Outpatient Visits           3 weeks ago Annual physical exam   [facility] at [facility], [place], MD

## 2023-08-24 IMAGING — DX DG KNEE COMPLETE 4+V*R*
5 series · 5 of 5 positions shown · non-contrast
Comparison: None.

CLINICAL DATA: Injury pain

EXAM:
RIGHT KNEE - COMPLETE 4+ VIEW

[knee ap]
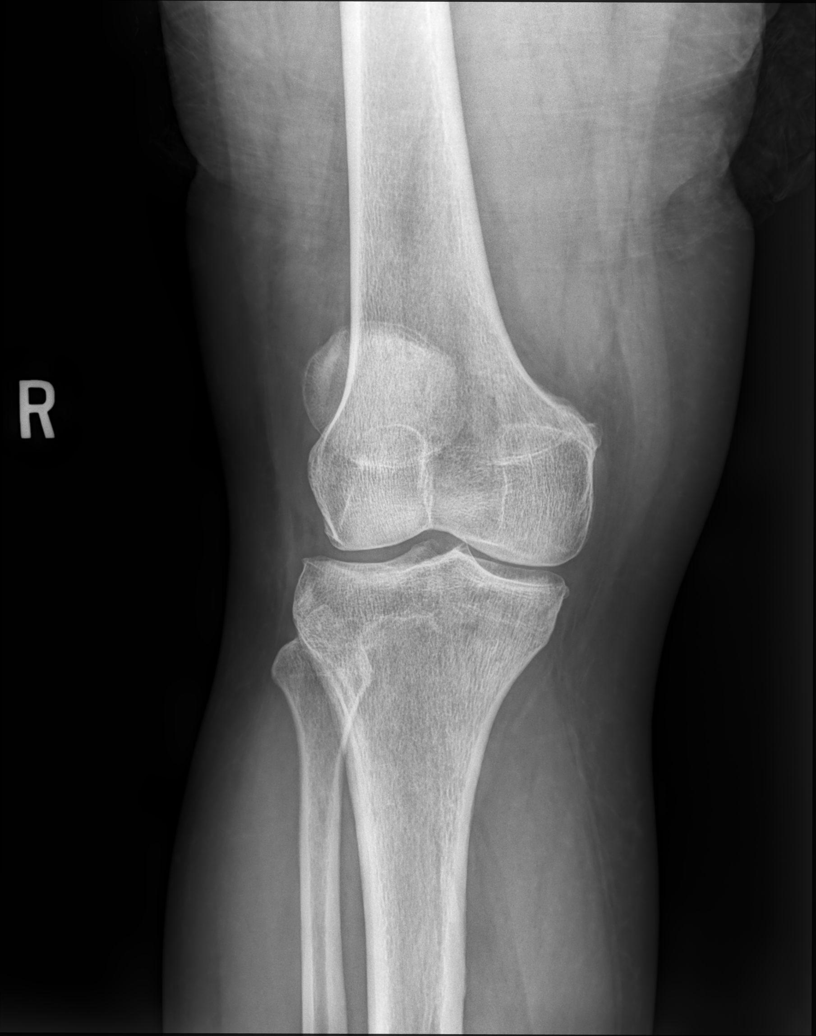

[knee mlo]
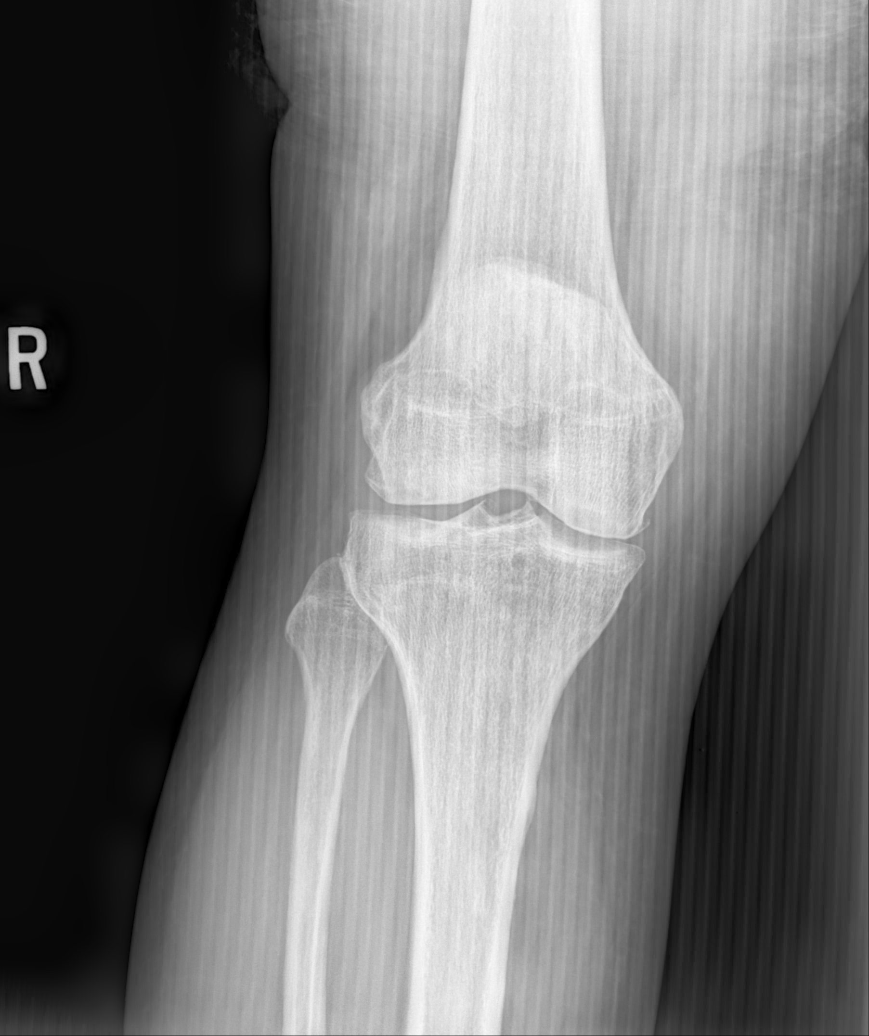

[knee lmo]
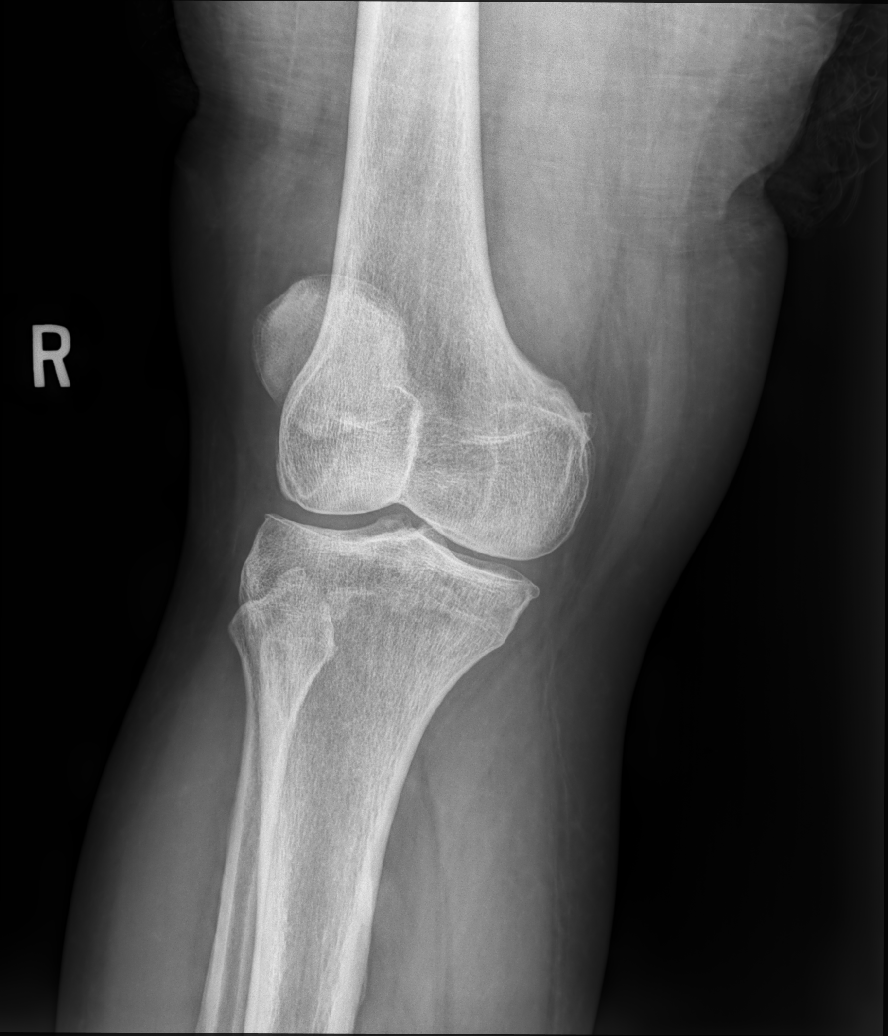

[knee lat]
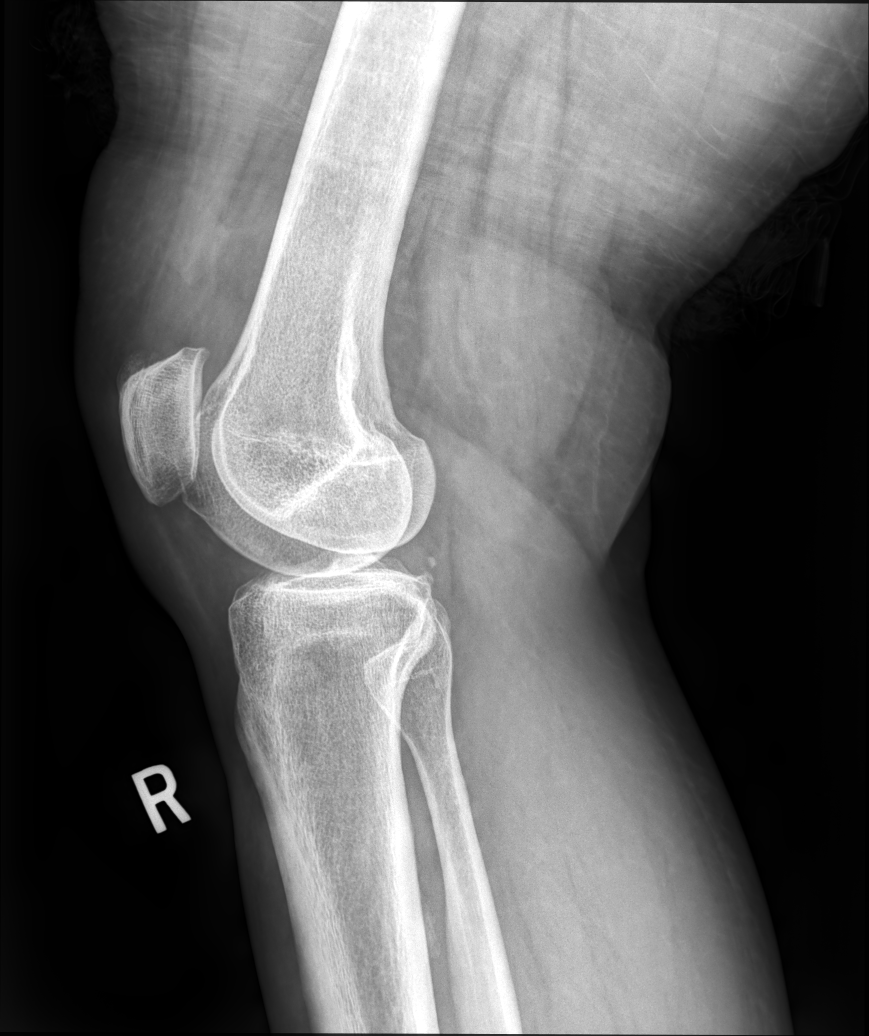

[knee (tunnel view) pa]
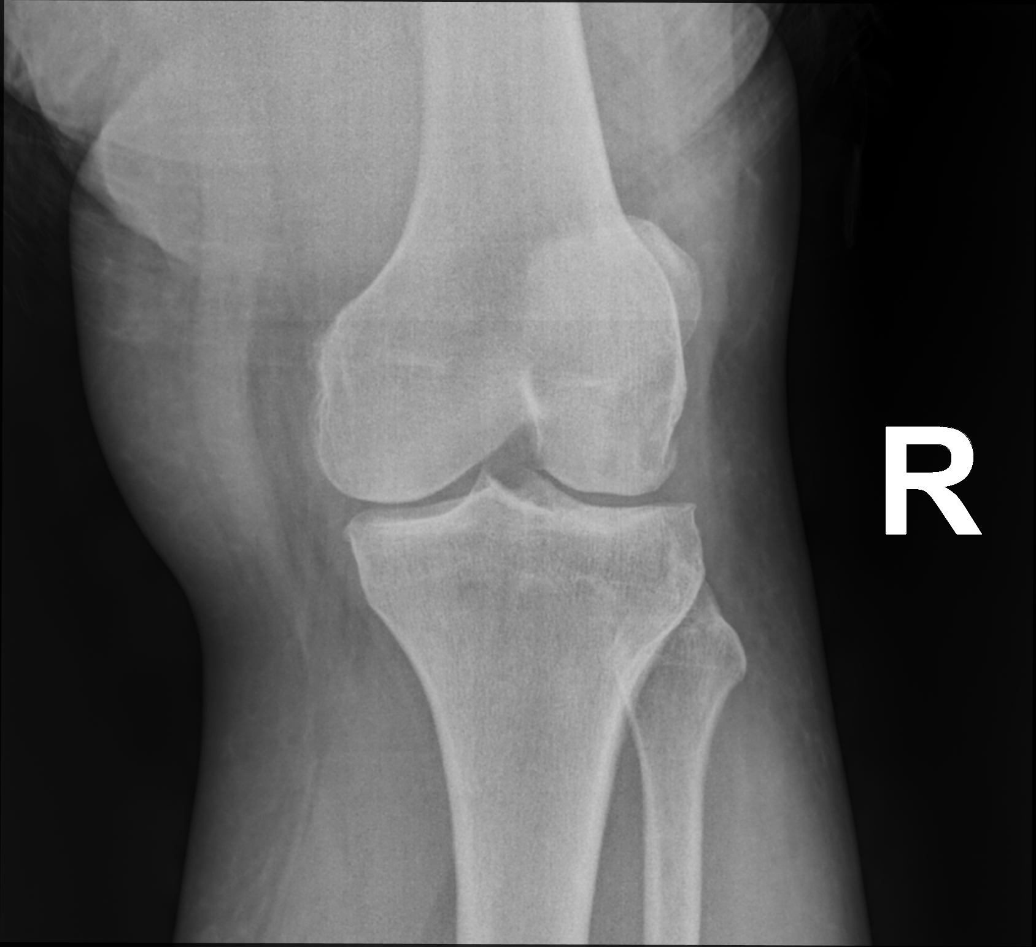

[5 of 5 positions shown; findings below may reference images not displayed]

FINDINGS: No fracture or malalignment. Mild tricompartment arthritis of the
knee. No sizable effusion.
IMPRESSION: No acute osseous abnormality.  Mild arthritis

## 2023-08-29 ENCOUNTER — Other Ambulatory Visit: Payer: Self-pay | Admitting: Internal Medicine

## 2023-08-29 DIAGNOSIS — I10 Essential (primary) hypertension: Secondary | ICD-10-CM

## 2023-08-29 DIAGNOSIS — E118 Type 2 diabetes mellitus with unspecified complications: Secondary | ICD-10-CM

## 2023-08-30 NOTE — Telephone Encounter (Signed)
 Requested Prescriptions  Pending Prescriptions Disp Refills   amLODipine  (NORVASC ) 10 MG tablet [Pharmacy Med Name: AMLODIPINE  BESYLATE 10MG  TABLETS] 90 tablet 0    Sig: TAKE 1 TABLET(10 MG) BY MOUTH DAILY     Cardiovascular: Calcium  Channel Blockers 2 Passed - 08/30/2023  3:45 PM      Passed - Last BP in normal range    BP Readings from Last 1 Encounters:  07/17/23 122/78         Passed - Last Heart Rate in normal range    Pulse Readings from Last 1 Encounters:  07/17/23 85         Passed - Valid encounter within last 6 months    Recent Outpatient Visits           1 month ago Annual physical exam   [place] [facility] at [facility], [place], MD               losartan  (COZAAR ) 100 MG tablet [Pharmacy Med Name: LOSARTAN  100MG  TABLETS] 90 tablet 0    Sig: TAKE 1 TABLET(100 MG) BY MOUTH DAILY     Cardiovascular:  Angiotensin Receptor Blockers Passed - 08/30/2023  3:45 PM      Passed - Cr in normal range and within 180 days    Creatinine, Ser  Date Value Ref Range Status  07/17/2023 0.92 0.57 - 1.00 mg/dL Final   Creatinine,U  Date Value Ref Range Status  06/11/2018 68.1 mg/dL Final         Passed - K in normal range and within 180 days    Potassium  Date Value Ref Range Status  07/17/2023 4.9 3.5 - 5.2 mmol/L Final         Passed - Patient is not pregnant      Passed - Last BP in normal range    BP Readings from Last 1 Encounters:  07/17/23 122/78         Passed - Valid encounter within last 6 months    Recent Outpatient Visits           1 month ago Annual physical exam   [facility] at [facility], [place], MD

## 2023-09-20 ENCOUNTER — Other Ambulatory Visit: Payer: Self-pay | Admitting: Internal Medicine

## 2023-09-20 ENCOUNTER — Encounter: Payer: Self-pay | Admitting: Internal Medicine

## 2023-09-20 DIAGNOSIS — E118 Type 2 diabetes mellitus with unspecified complications: Secondary | ICD-10-CM

## 2023-09-20 MED ORDER — TIRZEPATIDE 2.5 MG/0.5ML ~~LOC~~ SOAJ
2.5000 mg | SUBCUTANEOUS | 0 refills | Status: DC
Start: 2023-09-20 — End: 2023-10-16

## 2023-09-20 NOTE — Telephone Encounter (Signed)
 Please review.  KP

## 2023-09-21 ENCOUNTER — Other Ambulatory Visit: Payer: Self-pay | Admitting: Internal Medicine

## 2023-09-21 DIAGNOSIS — E118 Type 2 diabetes mellitus with unspecified complications: Secondary | ICD-10-CM

## 2023-09-22 NOTE — Telephone Encounter (Signed)
 Requested Prescriptions  Pending Prescriptions Disp Refills   metFORMIN  (GLUCOPHAGE ) 1000 MG tablet [Pharmacy Med Name: METFORMIN  1000MG  TABLETS] 180 tablet 0    Sig: TAKE 1 TABLET(1000 MG) BY MOUTH TWICE DAILY WITH A MEAL     Endocrinology:  Diabetes - Biguanides Failed - 09/22/2023  8:24 AM      Failed - B12 Level in normal range and within 720 days    No results found for: "VITAMINB12"       Passed - Cr in normal range and within 360 days    Creatinine, Ser  Date Value Ref Range Status  07/17/2023 0.92 0.57 - 1.00 mg/dL Final   Creatinine,U  Date Value Ref Range Status  06/11/2018 68.1 mg/dL Final         Passed - HBA1C is between 0 and 7.9 and within 180 days    HbA1c POC (<> result, manual entry)  Date Value Ref Range Status  06/11/2018 9.5 4.0 - 5.6 % Final   Hgb A1c MFr Bld  Date Value Ref Range Status  07/17/2023 7.8 (H) 4.8 - 5.6 % Final    Comment:             Prediabetes: 5.7 - 6.4          Diabetes: >6.4          Glycemic control for adults with diabetes: <7.0          Passed - eGFR in normal range and within 360 days    GFR calc Af Amer  Date Value Ref Range Status  03/04/2020 83 >59 mL/min/1.73 Final    Comment:    **In accordance with recommendations from the NKF-ASN Task force,**   [facility] is in the process of updating its eGFR calculation to the   2021 CKD-EPI creatinine equation that estimates kidney function   without a race variable.    GFR, Estimated  Date Value Ref Range Status  01/04/2021 >60 >60 mL/min Final    Comment:    (NOTE) Calculated using the CKD-EPI Creatinine Equation (2021)    GFR  Date Value Ref Range Status  06/11/2018 60.41 >60.00 mL/min Final   eGFR  Date Value Ref Range Status  07/17/2023 74 >59 mL/min/1.73 Final         Passed - Valid encounter within last 6 months    Recent Outpatient Visits           2 months ago Annual physical exam   [facility] at [facility],  [place], MD              Passed - CBC within normal limits and completed in the last 12 months    WBC  Date Value Ref Range Status  07/17/2023 9.3 3.4 - 10.8 x10E3/uL Final  01/04/2021 8.1 4.0 - 10.5 K/uL Final   RBC  Date Value Ref Range Status  07/17/2023 5.32 (H) 3.77 - 5.28 x10E6/uL Final  01/04/2021 4.74 3.87 - 5.11 MIL/uL Final   Hemoglobin  Date Value Ref Range Status  07/17/2023 15.2 11.1 - 15.9 g/dL Final   Hematocrit  Date Value Ref Range Status  07/17/2023 46.4 34.0 - 46.6 % Final   MCHC  Date Value Ref Range Status  07/17/2023 32.8 31.5 - 35.7 g/dL Final  95/28/4132 44.0 30.0 - 36.0 g/dL Final   [facility]  Date Value Ref Range Status  07/17/2023 28.6 26.6 - 33.0 pg Final  01/04/2021 31.0 26.0 - 34.0 pg  Final   MCV  Date Value Ref Range Status  07/17/2023 87 79 - 97 fL Final   No results found for: "PLTCOUNTKUC", "LABPLAT", "POCPLA" RDW  Date Value Ref Range Status  07/17/2023 13.7 11.7 - 15.4 % Final

## 2023-09-25 ENCOUNTER — Other Ambulatory Visit: Payer: Self-pay | Admitting: Internal Medicine

## 2023-09-26 NOTE — Telephone Encounter (Signed)
 Requested Prescriptions  Pending Prescriptions Disp Refills   montelukast  (SINGULAIR ) 10 MG tablet [Pharmacy Med Name: MONTELUKAST  10MG  TABLETS] 90 tablet 3    Sig: TAKE 1 TABLET(10 MG) BY MOUTH DAILY     Pulmonology:  Leukotriene Inhibitors Passed - 09/26/2023 10:16 AM      Passed - Valid encounter within last 12 months    Recent Outpatient Visits           2 months ago Annual physical exam   [facility] at [facility], [place], MD

## 2023-10-16 ENCOUNTER — Other Ambulatory Visit: Payer: Self-pay

## 2023-10-16 MED ORDER — TIRZEPATIDE 5 MG/0.5ML ~~LOC~~ SOAJ: 5.0000 mg | SUBCUTANEOUS | 0 refills | Status: DC

## 2023-10-29 ENCOUNTER — Other Ambulatory Visit: Payer: Self-pay | Admitting: Internal Medicine

## 2023-10-29 DIAGNOSIS — E118 Type 2 diabetes mellitus with unspecified complications: Secondary | ICD-10-CM

## 2023-10-31 NOTE — Telephone Encounter (Signed)
 Requested Prescriptions  Pending Prescriptions Disp Refills   metFORMIN  (GLUCOPHAGE ) 1000 MG tablet [Pharmacy Med Name: METFORMIN  1000MG  TABLETS] 180 tablet 1    Sig: TAKE 1 TABLET(1000 MG) BY MOUTH TWICE DAILY WITH A MEAL     Endocrinology:  Diabetes - Biguanides Failed - 10/31/2023 10:01 AM      Failed - B12 Level in normal range and within 720 days    No results found for: VITAMINB12       Passed - Cr in normal range and within 360 days    Creatinine, Ser  Date Value Ref Range Status  07/17/2023 0.92 0.57 - 1.00 mg/dL Final         Passed - HBA1C is between 0 and 7.9 and within 180 days    HbA1c POC (<> result, manual entry)  Date Value Ref Range Status  06/11/2018 9.5 4.0 - 5.6 % Final   Hgb A1c MFr Bld  Date Value Ref Range Status  07/17/2023 7.8 (H) 4.8 - 5.6 % Final    Comment:             Prediabetes: 5.7 - 6.4          Diabetes: >6.4          Glycemic control for adults with diabetes: <7.0          Passed - eGFR in normal range and within 360 days    GFR calc Af Amer  Date Value Ref Range Status  03/04/2020 83 >59 mL/min/1.73 Final    Comment:    **In accordance with recommendations from the NKF-ASN Task force,**   [facility] is in the process of updating its eGFR calculation to the   2021 CKD-EPI creatinine equation that estimates kidney function   without a race variable.    GFR, Estimated  Date Value Ref Range Status  01/04/2021 >60 >60 mL/min Final    Comment:    (NOTE) Calculated using the CKD-EPI Creatinine Equation (2021)    GFR  Date Value Ref Range Status  06/11/2018 60.41 >60.00 mL/min Final   eGFR  Date Value Ref Range Status  07/17/2023 74 >59 mL/min/1.73 Final         Passed - Valid encounter within last 6 months    Recent Outpatient Visits           3 months ago Annual physical exam   [place] [facility] at [facility], Leita DEL, MD              Passed - CBC within normal limits and  completed in the last 12 months    WBC  Date Value Ref Range Status  07/17/2023 9.3 3.4 - 10.8 x10E3/uL Final  01/04/2021 8.1 4.0 - 10.5 K/uL Final   RBC  Date Value Ref Range Status  07/17/2023 5.32 (H) 3.77 - 5.28 x10E6/uL Final  01/04/2021 4.74 3.87 - 5.11 MIL/uL Final   Hemoglobin  Date Value Ref Range Status  07/17/2023 15.2 11.1 - 15.9 g/dL Final   Hematocrit  Date Value Ref Range Status  07/17/2023 46.4 34.0 - 46.6 % Final   MCHC  Date Value Ref Range Status  07/17/2023 32.8 31.5 - 35.7 g/dL Final  90/80/7977 64.8 30.0 - 36.0 g/dL Final   [facility]  Date Value Ref Range Status  07/17/2023 28.6 26.6 - 33.0 pg Final  01/04/2021 31.0 26.0 - 34.0 pg Final   MCV  Date Value Ref Range Status  07/17/2023 87 79 -  97 fL Final   No results found for: PLTCOUNTKUC, LABPLAT, POCPLA RDW  Date Value Ref Range Status  07/17/2023 13.7 11.7 - 15.4 % Final

## 2023-11-10 ENCOUNTER — Other Ambulatory Visit: Payer: Self-pay | Admitting: Internal Medicine

## 2023-11-10 DIAGNOSIS — E118 Type 2 diabetes mellitus with unspecified complications: Secondary | ICD-10-CM

## 2023-11-12 ENCOUNTER — Other Ambulatory Visit: Payer: Self-pay | Admitting: Family Medicine

## 2023-11-12 DIAGNOSIS — E1169 Type 2 diabetes mellitus with other specified complication: Secondary | ICD-10-CM

## 2023-11-13 ENCOUNTER — Other Ambulatory Visit: Payer: Self-pay

## 2023-11-13 MED ORDER — TIRZEPATIDE 7.5 MG/0.5ML ~~LOC~~ SOAJ: 7.5000 mg | SUBCUTANEOUS | 0 refills | Status: DC

## 2023-11-13 NOTE — Telephone Encounter (Signed)
 Requested Prescriptions  Pending Prescriptions Disp Refills   insulin  glargine (LANTUS  SOLOSTAR) 100 UNIT/ML Solostar Pen [Pharmacy Med Name: LANTUS  SOLOSTAR PEN INJ 3ML] 15 mL 0    Sig: ADMINISTER 20 UNITS UNDER THE SKIN AT BEDTIME     Endocrinology:  Diabetes - Insulins Passed - 11/13/2023  7:40 AM      Passed - HBA1C is between 0 and 7.9 and within 180 days    HbA1c POC (<> result, manual entry)  Date Value Ref Range Status  06/11/2018 9.5 4.0 - 5.6 % Final   Hgb A1c MFr Bld  Date Value Ref Range Status  07/17/2023 7.8 (H) 4.8 - 5.6 % Final    Comment:             Prediabetes: 5.7 - 6.4          Diabetes: >6.4          Glycemic control for adults with diabetes: <7.0          Passed - Valid encounter within last 6 months    Recent Outpatient Visits           3 months ago Annual physical exam   [facility] at [facility], Leita DEL, MD

## 2023-11-27 ENCOUNTER — Ambulatory Visit (INDEPENDENT_AMBULATORY_CARE_PROVIDER_SITE_OTHER): Admitting: Internal Medicine

## 2023-11-27 ENCOUNTER — Encounter: Payer: Self-pay | Admitting: Internal Medicine

## 2023-11-27 VITALS — BP 122/60 | HR 84 | Ht 63.0 in | Wt 211.8 lb

## 2023-11-27 DIAGNOSIS — E785 Hyperlipidemia, unspecified: Secondary | ICD-10-CM

## 2023-11-27 DIAGNOSIS — E118 Type 2 diabetes mellitus with unspecified complications: Secondary | ICD-10-CM | POA: Diagnosis not present

## 2023-11-27 DIAGNOSIS — I10 Essential (primary) hypertension: Secondary | ICD-10-CM

## 2023-11-27 DIAGNOSIS — E1169 Type 2 diabetes mellitus with other specified complication: Secondary | ICD-10-CM

## 2023-11-27 DIAGNOSIS — Z794 Long term (current) use of insulin: Secondary | ICD-10-CM | POA: Diagnosis not present

## 2023-11-27 LAB — POCT GLYCOSYLATED HEMOGLOBIN (HGB A1C): Hemoglobin A1C: 6.2 % — AB (ref 4.0–5.6)

## 2023-11-27 MED ORDER — AMLODIPINE BESYLATE 10 MG PO TABS
ORAL_TABLET | ORAL | 1 refills | Status: AC
Start: 2023-11-27 — End: ?

## 2023-11-27 MED ORDER — LOSARTAN POTASSIUM 100 MG PO TABS
100.0000 mg | ORAL_TABLET | Freq: Every day | ORAL | 1 refills | Status: AC
Start: 2023-11-27 — End: ?

## 2023-11-27 MED ORDER — ROSUVASTATIN CALCIUM 10 MG PO TABS
ORAL_TABLET | ORAL | 1 refills | Status: AC
Start: 2023-11-27 — End: ?

## 2023-11-27 NOTE — Patient Instructions (Signed)
 Call [facility] to schedule your mammogram at [phone].

## 2023-11-27 NOTE — Assessment & Plan Note (Addendum)
 Blood sugars have been stable.  No hypoglycemic events since last visit. Currently medications are MTF, Farxiga , Lantus .  Tried Mounjaro  but has side effects of HA and itching plus GI SE. Last visit medical regimen changes were adding mounjaro  which has now been stopped. Lab Results  Component Value Date   HGBA1C 7.8 (H) 07/17/2023   A1C today = 6.2.  will continue the same medications.

## 2023-11-27 NOTE — Progress Notes (Signed)
 Date:  11/27/2023   Name:  Samantha Moses   DOB:  1971/03/07   MRN:  [id number]   Chief Complaint: Diabetes  Diabetes She presents for her follow-up diabetic visit. She has type 2 diabetes mellitus. Pertinent negatives for hypoglycemia include no headaches, nervousness/anxiousness or tremors. Pertinent negatives for diabetes include no chest pain, no fatigue, no polydipsia and no polyuria. Current diabetic treatment includes insulin  injections (farxiga  and MTF).    Review of Systems  Constitutional:  Negative for appetite change, fatigue, fever and unexpected weight change.  Eyes:  Negative for visual disturbance.  Respiratory:  Negative for cough, chest tightness and shortness of breath.   Cardiovascular:  Negative for chest pain, palpitations and leg swelling.  Gastrointestinal:  Negative for abdominal pain, constipation and diarrhea.  Endocrine: Negative for polydipsia and polyuria.  Genitourinary:  Negative for dysuria and hematuria.  Musculoskeletal:  Negative for arthralgias.  Neurological:  Negative for tremors, numbness and headaches.  Psychiatric/Behavioral:  Negative for dysphoric mood. The patient is not nervous/anxious.      Lab Results  Component Value Date   NA 140 07/17/2023   K 4.9 07/17/2023   CO2 22 07/17/2023   GLUCOSE 124 (H) 07/17/2023   BUN 26 (H) 07/17/2023   CREATININE 0.92 07/17/2023   CALCIUM  9.5 07/17/2023   EGFR 74 07/17/2023   GFRNONAA >60 01/04/2021   Lab Results  Component Value Date   CHOL 178 07/17/2023   HDL 59 07/17/2023   LDLCALC 90 07/17/2023   LDLDIRECT 151.0 06/11/2018   TRIG 171 (H) 07/17/2023   CHOLHDL 3.0 07/17/2023   Lab Results  Component Value Date   TSH 2.560 07/17/2023   Lab Results  Component Value Date   HGBA1C 6.2 (A) 11/27/2023   Lab Results  Component Value Date   WBC 9.3 07/17/2023   HGB 15.2 07/17/2023   HCT 46.4 07/17/2023   MCV 87 07/17/2023   PLT 205 07/17/2023   Lab Results  Component Value Date    ALT 51 (H) 07/17/2023   AST 29 07/17/2023   ALKPHOS 82 07/17/2023   BILITOT 0.3 07/17/2023   No results found for: MARIEN BOLLS, VD25OH   Patient Active Problem List   Diagnosis Date Noted   History of colonic polyps 11/28/2022   Close exposure to COVID-19 virus 04/07/2022   Carpal tunnel syndrome of right wrist 01/18/2021   Shoulder tendonitis, right 01/18/2021   OSA on CPAP 07/07/2020   Environmental and seasonal allergies 07/07/2020   Encounter for screening colonoscopy    Polyp of colon    Allergic rhinitis 08/22/2012   Hyperlipidemia associated with type 2 diabetes mellitus (HCC)    Type II diabetes mellitus with complication (HCC)    Essential hypertension    Adjustment reaction with anxiety and depression    ADD (attention deficit disorder)     Allergies  Allergen Reactions   Sulfa Antibiotics Anaphylaxis   Doxycycline      rash   Lisinopril      cough   Cefzil [Cefprozil] Rash    Past Surgical History:  Procedure Laterality Date   COLONOSCOPY WITH PROPOFOL  N/A 11/27/2019   Procedure: COLONOSCOPY WITH PROPOFOL ;  Surgeon: Janalyn Keene NOVAK, MD;  Location: [facility] ENDOSCOPY;  Service: Endoscopy;  Laterality: N/A;   COLONOSCOPY WITH PROPOFOL  N/A 11/28/2022   Procedure: COLONOSCOPY WITH PROPOFOL ;  Surgeon: Unk Corinn Skiff, MD;  Location: [facility] ENDOSCOPY;  Service: Gastroenterology;  Laterality: N/A;   none     POLYPECTOMY  11/28/2022  Procedure: POLYPECTOMY;  Surgeon: Unk Corinn Skiff, MD;  Location: [facility] ENDOSCOPY;  Service: Gastroenterology;;    Social History   Tobacco Use   Smoking status: Never   Smokeless tobacco: Never  Vaping Use   Vaping status: Never Used  Substance Use Topics   Alcohol use: Not Currently    Alcohol/week: 0.0 standard drinks of alcohol    Comment: rarely   Drug use: No     Medication list has been reviewed and updated.  Current Meds  Medication Sig   albuterol (PROVENTIL HFA;VENTOLIN HFA) 108 (90 BASE)  MCG/ACT inhaler Inhale 2 puffs into the lungs every 4 (four) hours as needed for wheezing.    busPIRone (BUSPAR) 10 MG tablet Take 10 mg by mouth 3 (three) times daily.   Continuous Glucose Sensor (DEXCOM G7 SENSOR) MISC USE AS DIRECTED CHANGE EVERY 10 DAYS   dapagliflozin  propanediol (FARXIGA ) 10 MG TABS tablet TAKE 1 TABLET(10 MG) BY MOUTH DAILY   insulin  glargine (LANTUS  SOLOSTAR) 100 UNIT/ML Solostar Pen ADMINISTER 20 UNITS UNDER THE SKIN AT BEDTIME   Insulin  Pen Needle (BD PEN NEEDLE NANO 2ND GEN) 32G X 4 MM MISC USE DAILY AT 6AM   lisdexamfetamine (VYVANSE ) 30 MG capsule TAKE 1 CAPSULE BY MOUTH DAILY   metFORMIN  (GLUCOPHAGE ) 1000 MG tablet TAKE 1 TABLET(1000 MG) BY MOUTH TWICE DAILY WITH A MEAL   montelukast  (SINGULAIR ) 10 MG tablet TAKE 1 TABLET(10 MG) BY MOUTH DAILY   Multiple Vitamin (MULTIVITAMIN) tablet Take 1 tablet by mouth daily.   Omega-3 Fatty Acids (FISH OIL TRIPLE STRENGTH) 1400 MG CAPS Take by mouth 2 (two) times daily.   sertraline (ZOLOFT) 100 MG tablet Take 200 mg by mouth daily.   [DISCONTINUED] amLODipine  (NORVASC ) 10 MG tablet TAKE 1 TABLET(10 MG) BY MOUTH DAILY   [DISCONTINUED] losartan  (COZAAR ) 100 MG tablet TAKE 1 TABLET(100 MG) BY MOUTH DAILY   [DISCONTINUED] rosuvastatin  (CRESTOR ) 10 MG tablet TAKE 1 TABLET(10 MG) BY MOUTH DAILY       11/27/2023   10:00 AM 07/17/2023    9:42 AM 02/27/2023    1:32 PM 10/25/2022    9:19 AM  GAD 7 : Generalized Anxiety Score  Nervous, Anxious, on Edge 0 0 2 1  Control/stop worrying 0 0 1 1  Worry too much - different things 0 0 1 1  Trouble relaxing 0 0 1 0  Restless 2 0 0 0  Easily annoyed or irritable 2 1 1 1   Afraid - awful might happen 0 0 0 0  Total GAD 7 Score 4 1 6 4   Anxiety Difficulty Not difficult at all  Somewhat difficult Not difficult at all       11/27/2023   10:00 AM 07/17/2023    9:41 AM 02/27/2023    1:32 PM  Depression screen PHQ 2/9  Decreased Interest 0 0 1  Down, Depressed, Hopeless 0 0 1  PHQ - 2  Score 0 0 2  Altered sleeping 1 2 2   Tired, decreased energy 2 2 2   Change in appetite 2 1 2   Feeling bad or failure about yourself  0 0 2  Trouble concentrating 2 0 2  Moving slowly or fidgety/restless 2 0 1  Suicidal thoughts 0 0 0  PHQ-9 Score 9 5 13   Difficult doing work/chores Somewhat difficult Not difficult at all Somewhat difficult    BP Readings from Last 3 Encounters:  11/27/23 122/60  07/17/23 122/78  02/27/23 118/78    Physical Exam Vitals and nursing note  reviewed.  Constitutional:      General: She is not in acute distress.    Appearance: Normal appearance. She is well-developed.  HENT:     Head: Normocephalic and atraumatic.  Neck:     Vascular: No carotid bruit.  Cardiovascular:     Rate and Rhythm: Normal rate and regular rhythm.     Heart sounds: No murmur heard. Pulmonary:     Effort: Pulmonary effort is normal. No respiratory distress.     Breath sounds: No wheezing or rhonchi.  Musculoskeletal:     Cervical back: Normal range of motion.     Right lower leg: No edema.  Lymphadenopathy:     Cervical: No cervical adenopathy.  Skin:    General: Skin is warm and dry.     Capillary Refill: Capillary refill takes less than 2 seconds.     Findings: No rash.  Neurological:     Mental Status: She is alert and oriented to person, place, and time.  Psychiatric:        Mood and Affect: Mood normal.        Behavior: Behavior normal.     Wt Readings from Last 3 Encounters:  11/27/23 211 lb 12.8 oz (96.1 kg)  07/17/23 219 lb 2 oz (99.4 kg)  02/27/23 213 lb (96.6 kg)    BP 122/60   Pulse 84   Ht 5' 3 (1.6 m)   Wt 211 lb 12.8 oz (96.1 kg)   LMP 01/17/2020 (Approximate)   SpO2 96%   BMI 37.52 kg/m   Assessment and Plan:  Problem List Items Addressed This Visit       Unprioritized   Essential hypertension (Chronic)   Blood pressure is well controlled. No medication side effects noted. Plan to continue current medications.      Relevant  Medications   losartan  (COZAAR ) 100 MG tablet   rosuvastatin  (CRESTOR ) 10 MG tablet   amLODipine  (NORVASC ) 10 MG tablet   Hyperlipidemia associated with type 2 diabetes mellitus (HCC) (Chronic)   Relevant Medications   losartan  (COZAAR ) 100 MG tablet   rosuvastatin  (CRESTOR ) 10 MG tablet   amLODipine  (NORVASC ) 10 MG tablet   Type II diabetes mellitus with complication (HCC) - Primary (Chronic)   Blood sugars have been stable.  No hypoglycemic events since last visit. Currently medications are MTF, Farxiga , Lantus .  Tried Mounjaro  but has side effects of HA and itching plus GI SE. Last visit medical regimen changes were adding mounjaro  which has now been stopped. Lab Results  Component Value Date   HGBA1C 7.8 (H) 07/17/2023   A1C today = 6.2.  will continue the same medications.       Relevant Medications   losartan  (COZAAR ) 100 MG tablet   rosuvastatin  (CRESTOR ) 10 MG tablet   amLODipine  (NORVASC ) 10 MG tablet   Other Relevant Orders   POCT glycosylated hemoglobin (Hb A1C) (Completed)   Other Visit Diagnoses       Long-term insulin  use (HCC)           Return in about 4 months (around 03/28/2024) for DM, HTN.    Leita HILARIO Adie, MD [facility] [place]

## 2023-11-27 NOTE — Assessment & Plan Note (Signed)
 Blood pressure is well controlled. No medication side effects noted. Plan to continue current medications.

## 2023-11-30 ENCOUNTER — Other Ambulatory Visit: Payer: Self-pay | Admitting: Internal Medicine

## 2023-11-30 ENCOUNTER — Ambulatory Visit
Admission: RE | Admit: 2023-11-30 | Discharge: 2023-11-30 | Disposition: A | Discharge status: Home / Self Care | Source: Ambulatory Visit | Attending: Internal Medicine | Admitting: Internal Medicine

## 2023-11-30 DIAGNOSIS — Z1231 Encounter for screening mammogram for malignant neoplasm of breast: Secondary | ICD-10-CM | POA: Diagnosis present

## 2023-11-30 DIAGNOSIS — E118 Type 2 diabetes mellitus with unspecified complications: Secondary | ICD-10-CM

## 2023-11-30 DIAGNOSIS — I10 Essential (primary) hypertension: Secondary | ICD-10-CM

## 2023-12-04 NOTE — Telephone Encounter (Signed)
 Duplicate request, LRF 11/27/23 for 90.E-Prescribing Status: Receipt confirmed by pharmacy (11/27/2023 10:10 AM EDT).  Requested Prescriptions  Pending Prescriptions Disp Refills   amLODipine  (NORVASC ) 10 MG tablet [Pharmacy Med Name: AMLODIPINE  BESYLATE 10MG TABLETS] 90 tablet 1    Sig: TAKE 1 TABLET(10 MG) BY MOUTH DAILY     Cardiovascular: Calcium  Channel Blockers 2 Passed - 12/04/2023  9:53 AM      Passed - Last BP in normal range    BP Readings from Last 1 Encounters:  11/27/23 122/60         Passed - Last Heart Rate in normal range    Pulse Readings from Last 1 Encounters:  11/27/23 84         Passed - Valid encounter within last 6 months    Recent Outpatient Visits           1 week ago Type II diabetes mellitus with complication [facility])   [place] [facility] at [facility], Leita DEL, MD   4 months ago Annual physical exam   [place] [facility] at [facility], Leita DEL, MD               losartan  (COZAAR ) 100 MG tablet [Pharmacy Med Name: LOSARTAN  100MG  TABLETS] 90 tablet 1    Sig: TAKE 1 TABLET(100 MG) BY MOUTH DAILY     Cardiovascular:  Angiotensin Receptor Blockers Passed - 12/04/2023  9:53 AM      Passed - Cr in normal range and within 180 days    Creatinine, Ser  Date Value Ref Range Status  07/17/2023 0.92 0.57 - 1.00 mg/dL Final         Passed - K in normal range and within 180 days    Potassium  Date Value Ref Range Status  07/17/2023 4.9 3.5 - 5.2 mmol/L Final         Passed - Patient is not pregnant      Passed - Last BP in normal range    BP Readings from Last 1 Encounters:  11/27/23 122/60         Passed - Valid encounter within last 6 months    Recent Outpatient Visits           1 week ago Type II diabetes mellitus with complication [facility])    [facility] Care & Sports Medicine at [facility], Leita DEL, MD   4 months ago Annual physical exam   [facility] at [facility], Leita DEL, MD

## 2024-02-05 ENCOUNTER — Other Ambulatory Visit: Payer: Self-pay | Admitting: Internal Medicine

## 2024-02-05 DIAGNOSIS — E118 Type 2 diabetes mellitus with unspecified complications: Secondary | ICD-10-CM

## 2024-02-06 NOTE — Telephone Encounter (Signed)
 Requested Prescriptions  Pending Prescriptions Disp Refills   FARXIGA  10 MG TABS tablet [Pharmacy Med Name: FARXIGA  10MG  TABLETS] 90 tablet 1    Sig: TAKE 1 TABLET(10 MG) BY MOUTH DAILY     Endocrinology:  Diabetes - SGLT2 Inhibitors Passed - 02/06/2024  2:59 PM      Passed - Cr in normal range and within 360 days    Creatinine, Ser  Date Value Ref Range Status  07/17/2023 0.92 0.57 - 1.00 mg/dL Final         Passed - HBA1C is between 0 and 7.9 and within 180 days    Hemoglobin A1C  Date Value Ref Range Status  11/27/2023 6.2 (A) 4.0 - 5.6 % Final   HbA1c POC (<> result, manual entry)  Date Value Ref Range Status  06/11/2018 9.5 4.0 - 5.6 % Final   Hgb A1c MFr Bld  Date Value Ref Range Status  07/17/2023 7.8 (H) 4.8 - 5.6 % Final    Comment:             Prediabetes: 5.7 - 6.4          Diabetes: >6.4          Glycemic control for adults with diabetes: <7.0          Passed - eGFR in normal range and within 360 days    GFR calc Af Amer  Date Value Ref Range Status  03/04/2020 83 >59 mL/min/1.73 Final    Comment:    **In accordance with recommendations from the NKF-ASN Task force,**   [facility] is in the process of updating its eGFR calculation to the   2021 CKD-EPI creatinine equation that estimates kidney function   without a race variable.    GFR, Estimated  Date Value Ref Range Status  01/04/2021 >60 >60 mL/min Final    Comment:    (NOTE) Calculated using the CKD-EPI Creatinine Equation (2021)    GFR  Date Value Ref Range Status  06/11/2018 60.41 >60.00 mL/min Final   eGFR  Date Value Ref Range Status  07/17/2023 74 >59 mL/min/1.73 Final         Passed - Valid encounter within last 6 months    Recent Outpatient Visits           2 months ago Type II diabetes mellitus with complication [facility])   [place] [facility] at [facility], Leita DEL, MD   6 months ago Annual physical exam   [facility] at [facility], Leita DEL, MD

## 2024-02-22 LAB — OPHTHALMOLOGY REPORT-SCANNED

## 2024-03-28 ENCOUNTER — Encounter: Payer: Self-pay | Admitting: Internal Medicine

## 2024-03-28 ENCOUNTER — Ambulatory Visit (INDEPENDENT_AMBULATORY_CARE_PROVIDER_SITE_OTHER): Admitting: Internal Medicine

## 2024-03-28 VITALS — BP 112/68 | HR 77 | Ht 63.0 in | Wt 220.0 lb

## 2024-03-28 DIAGNOSIS — E1169 Type 2 diabetes mellitus with other specified complication: Secondary | ICD-10-CM | POA: Diagnosis not present

## 2024-03-28 DIAGNOSIS — E118 Type 2 diabetes mellitus with unspecified complications: Secondary | ICD-10-CM | POA: Diagnosis not present

## 2024-03-28 DIAGNOSIS — E785 Hyperlipidemia, unspecified: Secondary | ICD-10-CM

## 2024-03-28 DIAGNOSIS — Z794 Long term (current) use of insulin: Secondary | ICD-10-CM

## 2024-03-28 DIAGNOSIS — I10 Essential (primary) hypertension: Secondary | ICD-10-CM | POA: Diagnosis not present

## 2024-03-28 MED ORDER — AMLODIPINE BESYLATE 10 MG PO TABS: ORAL_TABLET | ORAL | 1 refills | Status: AC

## 2024-03-28 MED ORDER — ROSUVASTATIN CALCIUM 10 MG PO TABS: ORAL_TABLET | ORAL | 1 refills | Status: AC

## 2024-03-28 MED ORDER — METFORMIN HCL 1000 MG PO TABS: ORAL_TABLET | ORAL | 1 refills | Status: AC

## 2024-03-28 MED ORDER — LOSARTAN POTASSIUM 100 MG PO TABS: 100.0000 mg | ORAL_TABLET | Freq: Every day | ORAL | 1 refills | Status: AC

## 2024-03-28 NOTE — Progress Notes (Signed)
 Date:  03/28/2024   Name:  Samantha Moses   DOB:  09/11/1970   MRN:  [id number]   Chief Complaint: Diabetes and Hypertension  Diabetes She presents for her follow-up diabetic visit. She has type 2 diabetes mellitus. Her disease course has been worsening. Pertinent negatives for hypoglycemia include no headaches or tremors. Pertinent negatives for diabetes include no chest pain, no fatigue, no polydipsia and no polyuria. Current diabetic treatments: Farxiga , MTF and Insulin . She is compliant with treatment most of the time (has not been taking the insulin  as regularly).  Hypertension This is a chronic problem. The problem is controlled. Pertinent negatives include no chest pain, headaches, palpitations or shortness of breath. Past treatments include angiotensin blockers and calcium  channel blockers. The current treatment provides significant improvement.    Review of Systems  Constitutional:  Negative for appetite change, fatigue, fever and unexpected weight change.  HENT:  Negative for tinnitus and trouble swallowing.   Eyes:  Negative for visual disturbance.  Respiratory:  Negative for cough, chest tightness and shortness of breath.   Cardiovascular:  Negative for chest pain, palpitations and leg swelling.  Gastrointestinal:  Negative for abdominal pain.  Endocrine: Negative for polydipsia and polyuria.  Genitourinary:  Negative for dysuria and hematuria.  Musculoskeletal:  Negative for arthralgias.  Neurological:  Negative for tremors, numbness and headaches.  Psychiatric/Behavioral:  Negative for dysphoric mood.      Lab Results  Component Value Date   NA 140 07/17/2023   K 4.9 07/17/2023   CO2 22 07/17/2023   GLUCOSE 124 (H) 07/17/2023   BUN 26 (H) 07/17/2023   CREATININE 0.92 07/17/2023   CALCIUM  9.5 07/17/2023   EGFR 74 07/17/2023   GFRNONAA >60 01/04/2021   Lab Results  Component Value Date   CHOL 178 07/17/2023   HDL 59 07/17/2023   LDLCALC 90 07/17/2023    LDLDIRECT 151.0 06/11/2018   TRIG 171 (H) 07/17/2023   CHOLHDL 3.0 07/17/2023   Lab Results  Component Value Date   TSH 2.560 07/17/2023   Lab Results  Component Value Date   HGBA1C 6.2 (A) 11/27/2023   Lab Results  Component Value Date   WBC 9.3 07/17/2023   HGB 15.2 07/17/2023   HCT 46.4 07/17/2023   MCV 87 07/17/2023   PLT 205 07/17/2023   Lab Results  Component Value Date   ALT 51 (H) 07/17/2023   AST 29 07/17/2023   ALKPHOS 82 07/17/2023   BILITOT 0.3 07/17/2023   No results found for: MARIEN BOLLS, VD25OH   Patient Active Problem List   Diagnosis Date Noted   History of colonic polyps 11/28/2022   Close exposure to COVID-19 virus 04/07/2022   Carpal tunnel syndrome of right wrist 01/18/2021   Shoulder tendonitis, right 01/18/2021   OSA on CPAP 07/07/2020   Environmental and seasonal allergies 07/07/2020   Encounter for screening colonoscopy    Polyp of colon    Allergic rhinitis 08/22/2012   Hyperlipidemia associated with type 2 diabetes mellitus (HCC)    Type II diabetes mellitus with complication (HCC)    Essential hypertension    Adjustment reaction with anxiety and depression    ADD (attention deficit disorder)     Allergies[1]  Past Surgical History:  Procedure Laterality Date   COLONOSCOPY WITH PROPOFOL  N/A 11/27/2019   Procedure: COLONOSCOPY WITH PROPOFOL ;  Surgeon: Janalyn Keene NOVAK, MD;  Location: [facility] ENDOSCOPY;  Service: Endoscopy;  Laterality: N/A;   COLONOSCOPY WITH PROPOFOL  N/A 11/28/2022  Procedure: COLONOSCOPY WITH PROPOFOL ;  Surgeon: Unk Corinn Skiff, MD;  Location: [facility] ENDOSCOPY;  Service: Gastroenterology;  Laterality: N/A;   none     POLYPECTOMY  11/28/2022   Procedure: POLYPECTOMY;  Surgeon: Unk Corinn Skiff, MD;  Location: [facility] ENDOSCOPY;  Service: Gastroenterology;;    Social History[2]   Medication list has been reviewed and updated.  Active Medications[3]     03/28/2024   10:03 AM 11/27/2023    10:00 AM 07/17/2023    9:42 AM 02/27/2023    1:32 PM  GAD 7 : Generalized Anxiety Score  Nervous, Anxious, on Edge 0 0 0 2  Control/stop worrying 0 0 0 1  Worry too much - different things 0 0 0 1  Trouble relaxing 0 0 0 1  Restless 0 2 0 0  Easily annoyed or irritable 0 2 1 1   Afraid - awful might happen 0 0 0 0  Total GAD 7 Score 0 4 1 6   Anxiety Difficulty Not difficult at all Not difficult at all  Somewhat difficult       03/28/2024   10:03 AM 11/27/2023   10:00 AM 07/17/2023    9:41 AM  Depression screen PHQ 2/9  Decreased Interest 0 0 0  Down, Depressed, Hopeless 0 0 0  PHQ - 2 Score 0 0 0  Altered sleeping 0 1 2  Tired, decreased energy 0 2 2  Change in appetite 0 2 1  Feeling bad or failure about yourself  0 0 0  Trouble concentrating 0 2 0  Moving slowly or fidgety/restless 0 2 0  Suicidal thoughts 0 0 0  PHQ-9 Score 0 9  5   Difficult doing work/chores Not difficult at all Somewhat difficult Not difficult at all     Data saved with a previous flowsheet row definition    BP Readings from Last 3 Encounters:  03/28/24 112/68  11/27/23 122/60  07/17/23 122/78    Physical Exam Vitals and nursing note reviewed.  Constitutional:      General: She is not in acute distress.    Appearance: Normal appearance. She is well-developed.  HENT:     Head: Normocephalic and atraumatic.  Neck:     Vascular: No carotid bruit.  Cardiovascular:     Rate and Rhythm: Normal rate and regular rhythm.  Pulmonary:     Effort: Pulmonary effort is normal. No respiratory distress.     Breath sounds: No wheezing or rhonchi.  Musculoskeletal:     Cervical back: Normal range of motion.     Right lower leg: No edema.     Left lower leg: No edema.  Lymphadenopathy:     Cervical: No cervical adenopathy.  Skin:    General: Skin is warm and dry.     Findings: No rash.  Neurological:     General: No focal deficit present.     Mental Status: She is alert and oriented to person,  place, and time.  Psychiatric:        Mood and Affect: Mood normal.        Behavior: Behavior normal.     Wt Readings from Last 3 Encounters:  03/28/24 220 lb (99.8 kg)  11/27/23 211 lb 12.8 oz (96.1 kg)  07/17/23 219 lb 2 oz (99.4 kg)    BP 112/68   Pulse 77   Ht 5' 3 (1.6 m)   Wt 220 lb (99.8 kg)   LMP 01/17/2020   SpO2 95%   BMI 38.97 kg/m  Assessment and Plan:  Problem List Items Addressed This Visit       Unprioritized   Hyperlipidemia associated with type 2 diabetes mellitus (HCC) (Chronic)   Relevant Medications   metFORMIN  (GLUCOPHAGE ) 1000 MG tablet   amLODipine  (NORVASC ) 10 MG tablet   losartan  (COZAAR ) 100 MG tablet   rosuvastatin  (CRESTOR ) 10 MG tablet   Type II diabetes mellitus with complication (HCC) - Primary (Chronic)   Currently medications are Farxiga , MTF and Lantus .  No hypoglycemic episodes noted. Last visit medical regimen changes were to stop mounjaro .  She also has not been following a good diet and has missed some insulin  doses. She starts a new job at [employer] next week and has access to nutrition and exercise counseling which will be beneficial. Lab Results  Component Value Date   HGBA1C 6.2 (A) 11/27/2023  A1C today = 8.3       Relevant Medications   metFORMIN  (GLUCOPHAGE ) 1000 MG tablet   amLODipine  (NORVASC ) 10 MG tablet   losartan  (COZAAR ) 100 MG tablet   rosuvastatin  (CRESTOR ) 10 MG tablet   Essential hypertension (Chronic)   Relevant Medications   amLODipine  (NORVASC ) 10 MG tablet   losartan  (COZAAR ) 100 MG tablet   rosuvastatin  (CRESTOR ) 10 MG tablet   Other Visit Diagnoses       Long-term insulin  use (HCC)           No follow-ups on file.    Leita HILARIO Adie, MD [facility] [place]           [1]  Allergies Allergen Reactions   Sulfa Antibiotics Anaphylaxis   Doxycycline      rash   Lisinopril      cough   Cefzil [Cefprozil] Rash  [2]  Social History Tobacco Use    Smoking status: Never   Smokeless tobacco: Never  Vaping Use   Vaping status: Never Used  Substance Use Topics   Alcohol use: Not Currently    Alcohol/week: 0.0 standard drinks of alcohol    Comment: rarely   Drug use: No  [3]  Current Meds  Medication Sig   albuterol (PROVENTIL HFA;VENTOLIN HFA) 108 (90 BASE) MCG/ACT inhaler Inhale 2 puffs into the lungs every 4 (four) hours as needed for wheezing.    buPROPion  ER (WELLBUTRIN  SR) 100 MG 12 hr tablet Take 100 mg by mouth 2 (two) times daily.   Continuous Glucose Sensor (DEXCOM G7 SENSOR) MISC USE AS DIRECTED CHANGE EVERY 10 DAYS   FARXIGA  10 MG TABS tablet TAKE 1 TABLET(10 MG) BY MOUTH DAILY   insulin  glargine (LANTUS  SOLOSTAR) 100 UNIT/ML Solostar Pen ADMINISTER 20 UNITS UNDER THE SKIN AT BEDTIME   Insulin  Pen Needle (BD PEN NEEDLE NANO 2ND GEN) 32G X 4 MM MISC USE DAILY AT 6AM   lisdexamfetamine (VYVANSE ) 30 MG capsule TAKE 1 CAPSULE BY MOUTH DAILY   montelukast  (SINGULAIR ) 10 MG tablet TAKE 1 TABLET(10 MG) BY MOUTH DAILY   Multiple Vitamin (MULTIVITAMIN) tablet Take 1 tablet by mouth daily.   Omega-3 Fatty Acids (FISH OIL TRIPLE STRENGTH) 1400 MG CAPS Take by mouth 2 (two) times daily.   sertraline (ZOLOFT) 100 MG tablet Take 200 mg by mouth daily.   [DISCONTINUED] amLODipine  (NORVASC ) 10 MG tablet TAKE 1 TABLET(10 MG) BY MOUTH DAILY   [DISCONTINUED] losartan  (COZAAR ) 100 MG tablet Take 1 tablet (100 mg total) by mouth daily.   [DISCONTINUED] metFORMIN  (GLUCOPHAGE ) 1000 MG tablet TAKE 1 TABLET(1000 MG) BY MOUTH TWICE DAILY WITH A MEAL   [  DISCONTINUED] rosuvastatin  (CRESTOR ) 10 MG tablet TAKE 1 TABLET(10 MG) BY MOUTH DAILY

## 2024-03-28 NOTE — Assessment & Plan Note (Addendum)
 Currently medications are Farxiga , MTF and Lantus .  No hypoglycemic episodes noted. Last visit medical regimen changes were to stop mounjaro .  She also has not been following a good diet and has missed some insulin  doses. She starts a new job at [employer] next week and has access to nutrition and exercise counseling which will be beneficial. Lab Results  Component Value Date   HGBA1C 6.2 (A) 11/27/2023  A1C today = 8.3

## 2024-05-16 ENCOUNTER — Ambulatory Visit
Admission: EM | Admit: 2024-05-16 | Discharge: 2024-05-16 | Disposition: A | Discharge status: Home / Self Care | Attending: Family Medicine | Admitting: Family Medicine

## 2024-05-16 DIAGNOSIS — H6502 Acute serous otitis media, left ear: Secondary | ICD-10-CM

## 2024-05-16 MED ORDER — AMOXICILLIN-POT CLAVULANATE 875-125 MG PO TABS: 1.0000 | ORAL_TABLET | Freq: Two times a day (BID) | ORAL | 0 refills | Status: AC

## 2024-05-16 NOTE — ED Provider Notes (Signed)
 " [facility]    CSN: [id number] Arrival date & time: 05/16/24  1643      History   Chief Complaint Chief Complaint  Patient presents with   Otalgia    HPI Samantha Moses is a 54 y.o. female.   HPI   Samantha Moses presents for left ear pain that radiates down her left side of her neck and is getting worse. Pain started 2-3 days ago.  Taking ibuprofen with some relief. No fever, cough, or rhinorrhea. Takes Allegra daily and Sudafed for nasal congestion.       Past Medical History:  Diagnosis Date   ADD (attention deficit disorder)    Allergy    Anxiety    Asthma    Mild related to allergies and exercise   Depression    Diabetes mellitus without complication (HCC)    Hyperlipidemia    Hypertension    Sleep apnea     Patient Active Problem List   Diagnosis Date Noted   History of colonic polyps 11/28/2022   Close exposure to COVID-19 virus 04/07/2022   Carpal tunnel syndrome of right wrist 01/18/2021   Shoulder tendonitis, right 01/18/2021   OSA on CPAP 07/07/2020   Environmental and seasonal allergies 07/07/2020   Encounter for screening colonoscopy    Polyp of colon    Allergic rhinitis 08/22/2012   Hyperlipidemia associated with type 2 diabetes mellitus (HCC)    Type II diabetes mellitus with complication (HCC)    Essential hypertension    Adjustment reaction with anxiety and depression    ADD (attention deficit disorder)     Past Surgical History:  Procedure Laterality Date   COLONOSCOPY WITH PROPOFOL  N/A 11/27/2019   Procedure: COLONOSCOPY WITH PROPOFOL ;  Surgeon: Janalyn Keene NOVAK, MD;  Location: [facility] ENDOSCOPY;  Service: Endoscopy;  Laterality: N/A;   COLONOSCOPY WITH PROPOFOL  N/A 11/28/2022   Procedure: COLONOSCOPY WITH PROPOFOL ;  Surgeon: Unk Corinn Skiff, MD;  Location: [facility] ENDOSCOPY;  Service: Gastroenterology;  Laterality: N/A;   none     POLYPECTOMY  11/28/2022   Procedure: POLYPECTOMY;  Surgeon: Unk Corinn Skiff, MD;  Location:  [facility] ENDOSCOPY;  Service: Gastroenterology;;    OB History   No obstetric history on file.      Home Medications    Prior to Admission medications  Medication Sig Start Date End Date Taking? Authorizing Provider  amLODipine  (NORVASC ) 10 MG tablet TAKE 1 TABLET(10 MG) BY MOUTH DAILY 03/28/24  Yes Berglund, Laura H, MD  amoxicillin -clavulanate (AUGMENTIN ) 875-125 MG tablet Take 1 tablet by mouth every 12 (twelve) hours. 05/16/24  Yes Michalle Rademaker, DO  buPROPion  ER (WELLBUTRIN  SR) 100 MG 12 hr tablet Take 100 mg by mouth 2 (two) times daily.   Yes [provider]  FARXIGA  10 MG TABS tablet TAKE 1 TABLET(10 MG) BY MOUTH DAILY 02/06/24  Yes Justus Leita DEL, MD  Insulin  Pen Needle (BD PEN NEEDLE NANO 2ND GEN) 32G X 4 MM MISC USE DAILY AT 6AM 03/28/22  Yes Justus Leita DEL, MD  lisdexamfetamine  (VYVANSE ) 30 MG capsule TAKE 1 CAPSULE BY MOUTH DAILY 04/13/22  Yes   losartan  (COZAAR ) 100 MG tablet Take 1 tablet (100 mg total) by mouth daily. 03/28/24  Yes Justus Leita DEL, MD  metFORMIN  (GLUCOPHAGE ) 1000 MG tablet TAKE 1 TABLET(1000 MG) BY MOUTH TWICE DAILY WITH A MEAL 03/28/24  Yes Justus Leita DEL, MD  montelukast  (SINGULAIR ) 10 MG tablet TAKE 1 TABLET(10 MG) BY MOUTH DAILY 09/26/23  Yes Justus Leita DEL, MD  Multiple Vitamin (MULTIVITAMIN) tablet Take 1 tablet by mouth daily.   Yes [provider]  Omega-3 Fatty Acids (FISH OIL TRIPLE STRENGTH) 1400 MG CAPS Take by mouth 2 (two) times daily.   Yes [provider]  rosuvastatin  (CRESTOR ) 10 MG tablet TAKE 1 TABLET(10 MG) BY MOUTH DAILY 03/28/24  Yes Berglund, Laura H, MD  sertraline (ZOLOFT) 100 MG tablet Take 200 mg by mouth daily. 01/16/23  Yes [provider]  albuterol (PROVENTIL HFA;VENTOLIN HFA) 108 (90 BASE) MCG/ACT inhaler Inhale 2 puffs into the lungs every 4 (four) hours as needed for wheezing.     [provider]  Continuous Glucose Sensor (DEXCOM G7 SENSOR) MISC USE AS DIRECTED CHANGE  EVERY 10 DAYS 06/19/23   Justus Leita DEL, MD  insulin  glargine (LANTUS  SOLOSTAR) 100 UNIT/ML Solostar Pen ADMINISTER 20 UNITS UNDER THE SKIN AT BEDTIME 11/13/23   Justus Leita DEL, MD    Family History Family History  Problem Relation Age of Onset   Hyperlipidemia Mother    Hypertension Mother    Anxiety disorder Mother    Hyperlipidemia Father    Hypertension Father    Diabetes Father    COPD Father    Hearing loss Father    Cancer Maternal Aunt 21       breast CA   Breast cancer Paternal Aunt    Breast cancer Cousin    Breast cancer Other    Breast cancer Other    Cancer Maternal Grandfather    Anxiety disorder Maternal Grandmother    Cancer Paternal Grandfather    Alcohol abuse Paternal Grandmother    Cancer Paternal Grandmother    Anxiety disorder Sister    Arthritis Sister    Depression Sister    Cancer Maternal Uncle    Diabetes Paternal Aunt    Stroke Paternal Aunt    Diabetes Paternal Uncle    Stroke Paternal Uncle    Stroke Paternal Uncle    Arrhythmia Paternal Aunt    Cancer Paternal Aunt    Diabetes Maternal Aunt     Social History Social History[1]   Allergies   Sulfa antibiotics, Doxycycline , Lisinopril , and Cefzil [cefprozil]   Review of Systems Review of Systems: :negative unless otherwise stated in HPI.      Physical Exam Triage Vital Signs ED Triage Vitals  Encounter Vitals Group     BP 05/16/24 1721 119/77     Girls Systolic BP Percentile --      Girls Diastolic BP Percentile --      Boys Systolic BP Percentile --      Boys Diastolic BP Percentile --      Pulse Rate 05/16/24 1721 78     Resp 05/16/24 1721 17     Temp 05/16/24 1721 98.5 F (36.9 C)     Temp Source 05/16/24 1721 Oral     SpO2 05/16/24 1721 93 %     Weight --      Height --      Head Circumference --      Peak Flow --      Pain Score 05/16/24 1720 7     Pain Loc --      Pain Education --      Exclude from Growth Chart --    No data found.  Updated Vital  Signs BP 119/77 (BP Location: Right Arm)   Pulse 78   Temp 98.5 F (36.9 C) (Oral)   Resp 17   LMP 01/17/2020   SpO2  93%   Visual Acuity Right Eye Distance:   Left Eye Distance:   Bilateral Distance:    Right Eye Near:   Left Eye Near:    Bilateral Near:     Physical Exam GEN:     alert, non-toxic appearing female in no distress    HENT:  mucus membranes moist, no nasal discharge, right TM normal, left TM bulging and erythematous with effusion, normal external auditory canals bilaterally, nontender tragus EYES:   no scleral injection or discharge  RESP:  no increased work of breathing CVS:   regular rate  Skin:   warm and dry    UC Treatments / Results  Labs (all labs ordered are listed, but only abnormal results are displayed) Labs Reviewed - No data to display  EKG   Radiology No results found.  Procedures Procedures (including critical care time)  Medications Ordered in UC Medications - No data to display  Initial Impression / Assessment and Plan / UC Course  I have reviewed the triage vital signs and the nursing notes.  Pertinent labs & imaging results that were available during my care of the patient were reviewed by me and considered in my medical decision making (see chart for details).      Acute Otitis media Overall patient is well-appearing, well-hydrated and without respiratory distress. She is afebrile. Treat left acute otitis media with Augmentin  BID for 7 days.  Tylenol /Motrin's as needed for fever or discomfort.  Stressed importance of hydration.   Discussed MDM, treatment plan and plan for follow-up with patient who agrees with plan.      Final Clinical Impressions(s) / UC Diagnoses   Final diagnoses:  Non-recurrent acute serous otitis media of left ear     Discharge Instructions      You have a ear infection.  Stop by the pharmacy to pick up your prescriptions.  Follow up with your primary care provider or return to the urgent  care, if not improving.        ED Prescriptions     Medication Sig Dispense Auth. Provider   amoxicillin -clavulanate (AUGMENTIN ) 875-125 MG tablet Take 1 tablet by mouth every 12 (twelve) hours. 14 tablet Erickson Yamashiro, DO      PDMP not reviewed this encounter.     [1]  Social History Tobacco Use   Smoking status: Never   Smokeless tobacco: Never  Vaping Use   Vaping status: Never Used  Substance Use Topics   Alcohol use: Not Currently    Alcohol/week: 0.0 standard drinks of alcohol    Comment: rarely   Drug use: No     Rhiana Morash, DO 05/24/24 1835  "

## 2024-05-16 NOTE — Discharge Instructions (Addendum)
 You have a ear infection.  Stop by the pharmacy to pick up your prescriptions.  Follow up with your primary care provider or return to the urgent care, if not improving.

## 2024-05-16 NOTE — ED Triage Notes (Signed)
 Pain in the left ear onset 2 days ago. States has been having sinus congestion as well that she is treating with sudafed.

## 2024-07-30 ENCOUNTER — Encounter: Admitting: Student
